# Patient Record
Sex: Female | Born: 1935 | Race: White | Hispanic: No | Marital: Single | Smoking: Never smoker
Health system: Southern US, Community
[De-identification: ages and names within clinical notes are randomized; demographics above are authoritative.]

## PROBLEM LIST (undated history)

## (undated) DIAGNOSIS — E261 Secondary hyperaldosteronism: Secondary | ICD-10-CM

## (undated) DIAGNOSIS — D6859 Other primary thrombophilia: Secondary | ICD-10-CM

## (undated) DIAGNOSIS — I10 Essential (primary) hypertension: Secondary | ICD-10-CM

## (undated) DIAGNOSIS — M1990 Unspecified osteoarthritis, unspecified site: Secondary | ICD-10-CM

## (undated) DIAGNOSIS — F419 Anxiety disorder, unspecified: Secondary | ICD-10-CM

## (undated) DIAGNOSIS — N182 Chronic kidney disease, stage 2 (mild): Secondary | ICD-10-CM

## (undated) DIAGNOSIS — E785 Hyperlipidemia, unspecified: Secondary | ICD-10-CM

## (undated) DIAGNOSIS — K635 Polyp of colon: Secondary | ICD-10-CM

## (undated) DIAGNOSIS — J189 Pneumonia, unspecified organism: Secondary | ICD-10-CM

## (undated) DIAGNOSIS — N289 Disorder of kidney and ureter, unspecified: Secondary | ICD-10-CM

## (undated) DIAGNOSIS — I739 Peripheral vascular disease, unspecified: Secondary | ICD-10-CM

## (undated) DIAGNOSIS — I700 Atherosclerosis of aorta: Secondary | ICD-10-CM

## (undated) DIAGNOSIS — I4891 Unspecified atrial fibrillation: Secondary | ICD-10-CM

## (undated) DIAGNOSIS — D497 Neoplasm of unspecified behavior of endocrine glands and other parts of nervous system: Secondary | ICD-10-CM

## (undated) DIAGNOSIS — I509 Heart failure, unspecified: Secondary | ICD-10-CM

## (undated) DIAGNOSIS — C801 Malignant (primary) neoplasm, unspecified: Secondary | ICD-10-CM

## (undated) DIAGNOSIS — E7800 Pure hypercholesterolemia, unspecified: Secondary | ICD-10-CM

## (undated) DIAGNOSIS — R296 Repeated falls: Secondary | ICD-10-CM

## (undated) DIAGNOSIS — I501 Left ventricular failure: Secondary | ICD-10-CM

## (undated) DIAGNOSIS — E559 Vitamin D deficiency, unspecified: Secondary | ICD-10-CM

## (undated) DIAGNOSIS — R739 Hyperglycemia, unspecified: Secondary | ICD-10-CM

## (undated) DIAGNOSIS — F0390 Unspecified dementia without behavioral disturbance: Secondary | ICD-10-CM

## (undated) DIAGNOSIS — K5790 Diverticulosis of intestine, part unspecified, without perforation or abscess without bleeding: Secondary | ICD-10-CM

## (undated) DIAGNOSIS — R609 Edema, unspecified: Secondary | ICD-10-CM

## (undated) HISTORY — DX: Diverticulosis of intestine, part unspecified, without perforation or abscess without bleeding: K57.90

## (undated) HISTORY — PX: CATARACT EXTRACTION: SUR2

## (undated) HISTORY — PX: APPENDECTOMY: SHX54

## (undated) HISTORY — DX: Polyp of colon: K63.5

## (undated) HISTORY — DX: Unspecified osteoarthritis, unspecified site: M19.90

## (undated) HISTORY — DX: Edema, unspecified: R60.9

## (undated) HISTORY — PX: TONSILLECTOMY: SUR1361

## (undated) HISTORY — DX: Hyperglycemia, unspecified: R73.9

## (undated) HISTORY — DX: Essential (primary) hypertension: I10

## (undated) HISTORY — DX: Hyperlipidemia, unspecified: E78.5

---

## 1995-04-11 LAB — HM COLONOSCOPY

## 1997-04-10 LAB — HM DEXA SCAN

## 2000-09-27 ENCOUNTER — Other Ambulatory Visit: Admission: RE | Admit: 2000-09-27 | Discharge: 2000-09-27 | Payer: Self-pay | Admitting: Family Medicine

## 2003-04-13 ENCOUNTER — Encounter: Admission: RE | Admit: 2003-04-13 | Discharge: 2003-04-13 | Payer: Self-pay | Admitting: Family Medicine

## 2005-10-23 ENCOUNTER — Other Ambulatory Visit: Admission: RE | Admit: 2005-10-23 | Discharge: 2005-10-23 | Payer: Self-pay | Admitting: Family Medicine

## 2006-05-22 ENCOUNTER — Ambulatory Visit (HOSPITAL_COMMUNITY): Admission: RE | Admit: 2006-05-22 | Discharge: 2006-05-22 | Payer: Self-pay | Admitting: Family Medicine

## 2007-10-29 LAB — HM PAP SMEAR

## 2007-11-06 LAB — HM MAMMOGRAPHY

## 2009-04-19 LAB — HEMOCCULT GUIAC POC 1CARD (OFFICE)

## 2010-07-12 ENCOUNTER — Encounter: Payer: Self-pay | Admitting: Family Medicine

## 2010-07-12 DIAGNOSIS — H269 Unspecified cataract: Secondary | ICD-10-CM | POA: Insufficient documentation

## 2010-07-12 DIAGNOSIS — R739 Hyperglycemia, unspecified: Secondary | ICD-10-CM

## 2010-07-12 DIAGNOSIS — M810 Age-related osteoporosis without current pathological fracture: Secondary | ICD-10-CM | POA: Insufficient documentation

## 2010-07-12 DIAGNOSIS — K635 Polyp of colon: Secondary | ICD-10-CM | POA: Insufficient documentation

## 2010-07-12 DIAGNOSIS — M1990 Unspecified osteoarthritis, unspecified site: Secondary | ICD-10-CM

## 2010-07-12 DIAGNOSIS — K5790 Diverticulosis of intestine, part unspecified, without perforation or abscess without bleeding: Secondary | ICD-10-CM | POA: Insufficient documentation

## 2010-07-12 DIAGNOSIS — E785 Hyperlipidemia, unspecified: Secondary | ICD-10-CM

## 2010-07-12 DIAGNOSIS — R609 Edema, unspecified: Secondary | ICD-10-CM

## 2010-07-12 DIAGNOSIS — I10 Essential (primary) hypertension: Secondary | ICD-10-CM | POA: Insufficient documentation

## 2010-09-20 ENCOUNTER — Emergency Department (HOSPITAL_COMMUNITY): Payer: Medicare Other

## 2010-09-20 ENCOUNTER — Emergency Department (HOSPITAL_COMMUNITY)
Admission: EM | Admit: 2010-09-20 | Discharge: 2010-09-20 | Disposition: A | Discharge status: Home / Self Care | Payer: Medicare Other | Attending: Emergency Medicine | Admitting: Emergency Medicine

## 2010-09-20 DIAGNOSIS — R112 Nausea with vomiting, unspecified: Secondary | ICD-10-CM | POA: Insufficient documentation

## 2010-09-20 DIAGNOSIS — R109 Unspecified abdominal pain: Secondary | ICD-10-CM | POA: Insufficient documentation

## 2010-09-20 DIAGNOSIS — Z79899 Other long term (current) drug therapy: Secondary | ICD-10-CM | POA: Insufficient documentation

## 2010-09-20 DIAGNOSIS — M129 Arthropathy, unspecified: Secondary | ICD-10-CM | POA: Insufficient documentation

## 2010-09-20 LAB — COMPREHENSIVE METABOLIC PANEL
ALT: 21 U/L (ref 0–35)
AST: 27 U/L (ref 0–37)
Albumin: 3.6 g/dL (ref 3.5–5.2)
Alkaline Phosphatase: 89 U/L (ref 39–117)
Calcium: 9.4 mg/dL (ref 8.4–10.5)
Chloride: 104 mEq/L (ref 96–112)
GFR calc Af Amer: >60 mL/min (ref >60)
GFR calc non Af Amer: >60 mL/min (ref >60)
Potassium: 4.2 mEq/L (ref 3.5–5.1)
Sodium: 140 mEq/L (ref 135–145)
Total Bilirubin: 0.4 mg/dL (ref 0.3–1.2)
Total Protein: 6.8 g/dL (ref 6.0–8.3)

## 2010-09-20 LAB — URINALYSIS, ROUTINE W REFLEX MICROSCOPIC
Bilirubin Urine: NEGATIVE
Glucose, UA: NEGATIVE mg/dL
Ketones, ur: NEGATIVE mg/dL
Nitrite: NEGATIVE
Specific Gravity, Urine: 1.02 (ref 1.005–1.030)
Urobilinogen, UA: 1 mg/dL (ref 0.0–1.0)

## 2010-09-20 LAB — CBC
HCT: 41.7 % (ref 36.0–46.0)
MCH: 32.3 pg (ref 26.0–34.0)
MCHC: 33.3 g/dL (ref 30.0–36.0)
MCV: 97 fL (ref 78.0–100.0)
Platelets: 253 10*3/uL (ref 150–400)
RBC: 4.3 MIL/uL (ref 3.87–5.11)
RDW: 12.9 % (ref 11.5–15.5)

## 2010-09-20 LAB — DIFFERENTIAL
Basophils Absolute: 0 10*3/uL (ref 0.0–0.1)
Basophils Relative: 0 % (ref 0–1)
Eosinophils Absolute: 0 10*3/uL (ref 0.0–0.7)
Eosinophils Relative: 0 % (ref 0–5)
Lymphocytes Relative: 12 % (ref 12–46)
Lymphs Abs: 1.2 10*3/uL (ref 0.7–4.0)
Monocytes Absolute: 0.5 10*3/uL (ref 0.1–1.0)
Monocytes Relative: 5 % (ref 3–12)
Neutro Abs: 8.9 10*3/uL — ABNORMAL HIGH (ref 1.7–7.7)
Neutrophils Relative %: 84 % — ABNORMAL HIGH (ref 43–77)

## 2010-09-20 LAB — CK TOTAL AND CKMB (NOT AT ARMC)
CK, MB: 1.8 ng/mL (ref 0.3–4.0)
Relative Index: INVALID (ref 0.0–2.5)
Total CK: 26 U/L (ref 7–177)

## 2010-09-20 LAB — LIPASE, BLOOD: Lipase: 24 U/L (ref 11–59)

## 2010-09-20 LAB — TROPONIN I: Troponin I: <0.3 ng/mL (ref <0.30)

## 2010-09-20 MED ORDER — IOHEXOL 300 MG/ML  SOLN
100.0000 mL | Freq: Once | INTRAMUSCULAR | Status: AC | PRN
  Administered 2010-09-20: 100 mL via INTRAVENOUS

## 2011-04-27 DIAGNOSIS — M1710 Unilateral primary osteoarthritis, unspecified knee: Secondary | ICD-10-CM | POA: Diagnosis not present

## 2011-05-24 DIAGNOSIS — E785 Hyperlipidemia, unspecified: Secondary | ICD-10-CM | POA: Diagnosis not present

## 2011-05-24 DIAGNOSIS — I10 Essential (primary) hypertension: Secondary | ICD-10-CM | POA: Diagnosis not present

## 2011-05-24 DIAGNOSIS — E559 Vitamin D deficiency, unspecified: Secondary | ICD-10-CM | POA: Diagnosis not present

## 2011-07-24 DIAGNOSIS — M25469 Effusion, unspecified knee: Secondary | ICD-10-CM | POA: Diagnosis not present

## 2011-07-24 DIAGNOSIS — M25569 Pain in unspecified knee: Secondary | ICD-10-CM | POA: Diagnosis not present

## 2011-08-31 DIAGNOSIS — R7309 Other abnormal glucose: Secondary | ICD-10-CM | POA: Diagnosis not present

## 2011-08-31 DIAGNOSIS — I10 Essential (primary) hypertension: Secondary | ICD-10-CM | POA: Diagnosis not present

## 2011-08-31 DIAGNOSIS — E559 Vitamin D deficiency, unspecified: Secondary | ICD-10-CM | POA: Diagnosis not present

## 2011-08-31 DIAGNOSIS — R609 Edema, unspecified: Secondary | ICD-10-CM | POA: Diagnosis not present

## 2011-08-31 DIAGNOSIS — E785 Hyperlipidemia, unspecified: Secondary | ICD-10-CM | POA: Diagnosis not present

## 2011-09-12 DIAGNOSIS — M25579 Pain in unspecified ankle and joints of unspecified foot: Secondary | ICD-10-CM | POA: Diagnosis not present

## 2011-09-12 DIAGNOSIS — M1710 Unilateral primary osteoarthritis, unspecified knee: Secondary | ICD-10-CM | POA: Diagnosis not present

## 2011-10-17 DIAGNOSIS — F411 Generalized anxiety disorder: Secondary | ICD-10-CM | POA: Diagnosis not present

## 2011-10-17 DIAGNOSIS — H6120 Impacted cerumen, unspecified ear: Secondary | ICD-10-CM | POA: Diagnosis not present

## 2011-10-17 DIAGNOSIS — R5381 Other malaise: Secondary | ICD-10-CM | POA: Diagnosis not present

## 2011-10-19 DIAGNOSIS — H40019 Open angle with borderline findings, low risk, unspecified eye: Secondary | ICD-10-CM | POA: Diagnosis not present

## 2011-10-19 DIAGNOSIS — Z961 Presence of intraocular lens: Secondary | ICD-10-CM | POA: Diagnosis not present

## 2011-10-19 DIAGNOSIS — H3530 Unspecified macular degeneration: Secondary | ICD-10-CM | POA: Diagnosis not present

## 2011-10-19 DIAGNOSIS — H0230 Blepharochalasis unspecified eye, unspecified eyelid: Secondary | ICD-10-CM | POA: Diagnosis not present

## 2011-11-23 DIAGNOSIS — K5900 Constipation, unspecified: Secondary | ICD-10-CM | POA: Diagnosis not present

## 2011-11-24 ENCOUNTER — Emergency Department (HOSPITAL_COMMUNITY)
Admission: EM | Admit: 2011-11-24 | Discharge: 2011-11-24 | Disposition: A | Discharge status: Home / Self Care | Payer: Medicare Other | Attending: Emergency Medicine | Admitting: Emergency Medicine

## 2011-11-24 ENCOUNTER — Emergency Department (HOSPITAL_COMMUNITY): Payer: Medicare Other

## 2011-11-24 ENCOUNTER — Encounter (HOSPITAL_COMMUNITY): Payer: Self-pay | Admitting: *Deleted

## 2011-11-24 DIAGNOSIS — E785 Hyperlipidemia, unspecified: Secondary | ICD-10-CM | POA: Diagnosis not present

## 2011-11-24 DIAGNOSIS — E7800 Pure hypercholesterolemia, unspecified: Secondary | ICD-10-CM | POA: Insufficient documentation

## 2011-11-24 DIAGNOSIS — Z9089 Acquired absence of other organs: Secondary | ICD-10-CM | POA: Diagnosis not present

## 2011-11-24 DIAGNOSIS — R6889 Other general symptoms and signs: Secondary | ICD-10-CM | POA: Diagnosis not present

## 2011-11-24 DIAGNOSIS — I10 Essential (primary) hypertension: Secondary | ICD-10-CM | POA: Insufficient documentation

## 2011-11-24 DIAGNOSIS — K5900 Constipation, unspecified: Secondary | ICD-10-CM | POA: Diagnosis not present

## 2011-11-24 DIAGNOSIS — M810 Age-related osteoporosis without current pathological fracture: Secondary | ICD-10-CM | POA: Insufficient documentation

## 2011-11-24 DIAGNOSIS — R1084 Generalized abdominal pain: Secondary | ICD-10-CM | POA: Diagnosis not present

## 2011-11-24 HISTORY — DX: Pure hypercholesterolemia, unspecified: E78.00

## 2011-11-24 LAB — CBC WITH DIFFERENTIAL/PLATELET
Basophils Relative: 0 % (ref 0–1)
Eosinophils Absolute: 0.1 10*3/uL (ref 0.0–0.7)
HCT: 40.1 % (ref 36.0–46.0)
Hemoglobin: 13.6 g/dL (ref 12.0–15.0)
Lymphocytes Relative: 18 % (ref 12–46)
Lymphs Abs: 1.6 10*3/uL (ref 0.7–4.0)
MCH: 32.2 pg (ref 26.0–34.0)
MCHC: 33.9 g/dL (ref 30.0–36.0)
MCV: 95 fL (ref 78.0–100.0)
Monocytes Absolute: 0.7 10*3/uL (ref 0.1–1.0)
Monocytes Relative: 8 % (ref 3–12)
Neutro Abs: 6.7 10*3/uL (ref 1.7–7.7)
Neutrophils Relative %: 73 % (ref 43–77)
Platelets: 268 10*3/uL (ref 150–400)
RBC: 4.22 MIL/uL (ref 3.87–5.11)
RDW: 13 % (ref 11.5–15.5)
WBC: 9.2 10*3/uL (ref 4.0–10.5)

## 2011-11-24 LAB — URINALYSIS, ROUTINE W REFLEX MICROSCOPIC
Bilirubin Urine: NEGATIVE
Glucose, UA: NEGATIVE mg/dL
Hgb urine dipstick: NEGATIVE
Specific Gravity, Urine: 1.01 (ref 1.005–1.030)
pH: 6.5 (ref 5.0–8.0)

## 2011-11-24 LAB — HEPATIC FUNCTION PANEL
AST: 15 U/L (ref 0–37)
Bilirubin, Direct: 0.1 mg/dL (ref 0.0–0.3)
Indirect Bilirubin: 0.3 mg/dL (ref 0.3–0.9)
Total Bilirubin: 0.4 mg/dL (ref 0.3–1.2)

## 2011-11-24 LAB — BASIC METABOLIC PANEL
BUN: 18 mg/dL (ref 6–23)
CO2: 29 mEq/L (ref 19–32)
Chloride: 100 mEq/L (ref 96–112)
Creatinine, Ser: 0.71 mg/dL (ref 0.50–1.10)
GFR calc Af Amer: 83 mL/min — ABNORMAL LOW (ref >90)
GFR calc non Af Amer: 72 mL/min — ABNORMAL LOW (ref >90)
Glucose, Bld: 102 mg/dL — ABNORMAL HIGH (ref 70–99)
Potassium: 4 mEq/L (ref 3.5–5.1)
Sodium: 136 mEq/L (ref 135–145)

## 2011-11-24 LAB — URINE MICROSCOPIC-ADD ON

## 2011-11-24 MED ORDER — POLYETHYLENE GLYCOL 3350 17 GM/SCOOP PO POWD: ORAL | Status: DC

## 2011-11-24 NOTE — ED Notes (Signed)
Pt alert & oriented x4, stable gait. Patient  given discharge instructions, paperwork & prescription(s). Patient verbalized understanding. Pt left department w/ no further questions. 

## 2011-11-24 NOTE — ED Provider Notes (Signed)
History  This chart was scribed for Carleene Cooper III, MD by Erskine Emery. This patient was seen in room [facility]04/[facility]04 and the patient's care was started at 17:54.   CSN: [id number]  Arrival date & time 11/24/11  1633   First MD Initiated Contact with Patient 11/24/11 1754      Chief Complaint  Patient presents with  . Abdominal Pain    (Consider location/radiation/quality/duration/timing/severity/associated sxs/prior treatment) HPI Marissa Burns is a 78 y.o. female brought in by ambulance, who presents to the Emergency Department complaining of diffuse abdominal pain for several days with associated nausea, diaphoresis, and constipation. Pt denies any dysuria or fever. Pt reports her last bowel movement was 2 days ago after using a suppository and it was small. Pt tried magnesium today and began taking an OTC laxative a week ago. Pt has had a cholecystectomy and tonsilectomy and has a h/o diverticulosis, osteoarthritis, osteoporosis, HTN and high cholesterol. Pt takes medication for the HTN and high cholesterol. Pt lives alone and reports for a 78 year old, her health is usually good.  Pt has NKDA and doesn't smoke or drink.   Past Medical History  Diagnosis Date  . Essential hypertension, benign   . Colon polyp   . Other and unspecified hyperlipidemia   . Diverticulosis   . Osteoarthritis   . Edema   . Osteoporosis   . Elevated blood sugar   . Cataract   . High cholesterol     Past Surgical History  Procedure Date  . Cataract extraction   . Appendectomy   . Tonsillectomy     No family history on file.  History  Substance Use Topics  . Smoking status: Not on file  . Smokeless tobacco: Not on file  . Alcohol Use:     OB History    Grav Para Term Preterm Abortions TAB SAB Ect Mult Living                  Review of Systems  Constitutional: Positive for diaphoresis. Negative for fever and chills.  Respiratory: Negative for shortness of breath.     Gastrointestinal: Positive for nausea, abdominal pain and constipation. Negative for vomiting.  Genitourinary: Negative for dysuria.  Musculoskeletal: Positive for joint swelling.  Neurological: Negative for syncope.    Allergies  Alendronate sodium; Celebrex; Dilacor; Evista; and Propulsid  Home Medications   Current Outpatient Rx  Name Route Sig Dispense Refill  . ACETAMINOPHEN 500 MG PO TABS Oral Take 500 mg by mouth daily after lunch.    . AMLODIPINE BESY-BENAZEPRIL HCL 5-20 MG PO CAPS Oral Take 1 capsule by mouth daily after breakfast.     . ASPIRIN 81 MG PO TBEC Oral Take 81 mg by mouth daily after breakfast.     . ATORVASTATIN CALCIUM 80 MG PO TABS Oral Take 80 mg by mouth daily after breakfast.    . CALCIUM CARBONATE ANTACID 500 MG PO CHEW Oral Chew 1 tablet by mouth daily.      [name]     . FUROSEMIDE 20 MG PO TABS Oral Take 20 mg by mouth daily after breakfast.       Triage Vitals: BP 160/77  Pulse 89  Temp 98 F (36.7 C) (Oral)  Resp 20  Ht 5\' 2"  (1.575 m)  Wt 145 lb (65.772 kg)  BMI 26.52 kg/m2  SpO2 94%  Physical Exam  Nursing note and  vitals reviewed. Constitutional: She is oriented to person, place, and time. She appears well-developed and well-nourished. No distress.  HENT:  Head: Normocephalic and atraumatic.  Mouth/Throat: Oropharynx is clear and moist.  Eyes: EOM are normal. Pupils are equal, round, and reactive to light.  Neck: Neck supple. No tracheal deviation present.  Cardiovascular: Normal rate, regular rhythm and normal heart sounds.   Pulmonary/Chest: Effort normal and breath sounds normal. No respiratory distress. She has no wheezes.  Abdominal: Soft. She exhibits no distension and no mass. There is no tenderness.       Increased bowel sounds.  Genitourinary:       Rectal exam shows soft brown stool, no fecal impaction.  Musculoskeletal: Normal range of motion. She  exhibits no edema.       Joint enlargement from arthritis in knees bilaterally.  Lymphadenopathy:    She has no cervical adenopathy.  Neurological: She is alert and oriented to person, place, and time.  Skin: Skin is warm and dry.  Psychiatric: She has a normal mood and affect.    ED Course  Procedures (including critical care time) DIAGNOSTIC STUDIES: Oxygen Saturation is 94% on room air, adequate by my interpretation.    COORDINATION OF CARE: 18:32--I evaluated the patient and we discussed a treatment plan including urinalysis, blood work, and imaging to which the pt agreed.     Labs Reviewed  CBC WITH DIFFERENTIAL  BASIC METABOLIC PANEL  URINALYSIS, ROUTINE W REFLEX MICROSCOPIC   Dg Abd Acute W/chest  11/24/2011  *RADIOLOGY REPORT*  Clinical Data: Abdominal cramping, nausea, constipation.  ACUTE ABDOMEN SERIES (ABDOMEN 2 VIEW & CHEST 1 VIEW)  Comparison: 09/20/2010  Findings: Large stool burden throughout the colon.  No evidence of bowel obstruction or free air.  Prior cholecystectomy.  No organomegaly or suspicious calcification.  Small calcifications in the pelvis felt to represent phleboliths as seen on prior CT.  The lungs are clear.  No effusions.  Heart is normal size.  No acute bony abnormality.  IMPRESSION: Moderate to large stool burden throughout the colon.  No evidence of obstruction or free air.  Prior cholecystectomy.  Original Report Authenticated By: Cyndie Chime, M.D.     1. Constipation     DISP:  Rx Miralax one scoop in glass of water bid, every day, to treat her constipation.   I personally performed the services described in this documentation, which was scribed in my presence. The recorded information has been reviewed and considered.  Osvaldo Human, MD     Carleene Cooper III, MD 11/24/11 Rosamaria Lints

## 2011-11-24 NOTE — ED Notes (Signed)
C/o diffuse abd pain x 2 weeks; pt states she is constipated; states last BM 2 days ago after using suppository with small results.  Denies vomiting, but c/o nausea.

## 2011-11-26 LAB — URINE CULTURE

## 2011-12-04 DIAGNOSIS — R109 Unspecified abdominal pain: Secondary | ICD-10-CM | POA: Diagnosis not present

## 2011-12-04 DIAGNOSIS — E559 Vitamin D deficiency, unspecified: Secondary | ICD-10-CM | POA: Diagnosis not present

## 2011-12-04 DIAGNOSIS — E785 Hyperlipidemia, unspecified: Secondary | ICD-10-CM | POA: Diagnosis not present

## 2011-12-04 DIAGNOSIS — E039 Hypothyroidism, unspecified: Secondary | ICD-10-CM | POA: Diagnosis not present

## 2011-12-04 DIAGNOSIS — I10 Essential (primary) hypertension: Secondary | ICD-10-CM | POA: Diagnosis not present

## 2011-12-07 DIAGNOSIS — Z1212 Encounter for screening for malignant neoplasm of rectum: Secondary | ICD-10-CM | POA: Diagnosis not present

## 2011-12-19 DIAGNOSIS — K5900 Constipation, unspecified: Secondary | ICD-10-CM | POA: Diagnosis not present

## 2011-12-19 DIAGNOSIS — R634 Abnormal weight loss: Secondary | ICD-10-CM | POA: Diagnosis not present

## 2011-12-19 DIAGNOSIS — R609 Edema, unspecified: Secondary | ICD-10-CM | POA: Diagnosis not present

## 2012-01-18 DIAGNOSIS — Z23 Encounter for immunization: Secondary | ICD-10-CM | POA: Diagnosis not present

## 2012-03-04 DIAGNOSIS — I10 Essential (primary) hypertension: Secondary | ICD-10-CM | POA: Diagnosis not present

## 2012-03-04 DIAGNOSIS — E785 Hyperlipidemia, unspecified: Secondary | ICD-10-CM | POA: Diagnosis not present

## 2012-03-04 DIAGNOSIS — R5383 Other fatigue: Secondary | ICD-10-CM | POA: Diagnosis not present

## 2012-03-04 DIAGNOSIS — E559 Vitamin D deficiency, unspecified: Secondary | ICD-10-CM | POA: Diagnosis not present

## 2012-03-28 DIAGNOSIS — M1710 Unilateral primary osteoarthritis, unspecified knee: Secondary | ICD-10-CM | POA: Diagnosis not present

## 2012-06-19 DIAGNOSIS — E559 Vitamin D deficiency, unspecified: Secondary | ICD-10-CM | POA: Diagnosis not present

## 2012-06-19 DIAGNOSIS — I10 Essential (primary) hypertension: Secondary | ICD-10-CM | POA: Diagnosis not present

## 2012-06-19 DIAGNOSIS — E785 Hyperlipidemia, unspecified: Secondary | ICD-10-CM | POA: Diagnosis not present

## 2012-07-03 ENCOUNTER — Encounter: Payer: Self-pay | Admitting: Family Medicine

## 2012-07-09 ENCOUNTER — Telehealth: Payer: Self-pay

## 2012-07-10 ENCOUNTER — Telehealth: Payer: Self-pay | Admitting: Family Medicine

## 2012-07-10 ENCOUNTER — Telehealth: Payer: Self-pay

## 2012-07-10 ENCOUNTER — Encounter: Payer: Self-pay | Admitting: Family Medicine

## 2012-07-10 DIAGNOSIS — M25569 Pain in unspecified knee: Secondary | ICD-10-CM

## 2012-07-10 NOTE — Telephone Encounter (Signed)
Please do referral to ortho next week for knee injection

## 2012-07-10 NOTE — Telephone Encounter (Signed)
Ortho referral  Dr Charlann Boxer next week

## 2012-07-10 NOTE — Telephone Encounter (Signed)
Refer to ortho for knee pain. 

## 2012-07-10 NOTE — Telephone Encounter (Signed)
Dr Latricia Heft next week per [facility]

## 2012-07-10 NOTE — Telephone Encounter (Signed)
Dr Charlann Boxer

## 2012-07-16 ENCOUNTER — Other Ambulatory Visit: Payer: Self-pay | Admitting: Family Medicine

## 2012-08-01 DIAGNOSIS — M1710 Unilateral primary osteoarthritis, unspecified knee: Secondary | ICD-10-CM | POA: Diagnosis not present

## 2012-08-22 NOTE — Telephone Encounter (Signed)
error 

## 2012-09-05 DIAGNOSIS — M1710 Unilateral primary osteoarthritis, unspecified knee: Secondary | ICD-10-CM | POA: Diagnosis not present

## 2012-09-16 DIAGNOSIS — M1710 Unilateral primary osteoarthritis, unspecified knee: Secondary | ICD-10-CM | POA: Diagnosis not present

## 2012-09-30 DIAGNOSIS — M1710 Unilateral primary osteoarthritis, unspecified knee: Secondary | ICD-10-CM | POA: Diagnosis not present

## 2012-10-22 DIAGNOSIS — H353190 Nonexudative age-related macular degeneration, unspecified eye, stage unspecified: Secondary | ICD-10-CM | POA: Diagnosis not present

## 2012-10-30 ENCOUNTER — Encounter: Payer: Self-pay | Admitting: Family Medicine

## 2012-10-30 ENCOUNTER — Ambulatory Visit (INDEPENDENT_AMBULATORY_CARE_PROVIDER_SITE_OTHER): Payer: Medicare Other | Admitting: Family Medicine

## 2012-10-30 VITALS — BP 143/67 | HR 73 | Temp 98.0°F | Ht 60.5 in | Wt 151.0 lb

## 2012-10-30 DIAGNOSIS — E559 Vitamin D deficiency, unspecified: Secondary | ICD-10-CM | POA: Diagnosis not present

## 2012-10-30 DIAGNOSIS — I10 Essential (primary) hypertension: Secondary | ICD-10-CM | POA: Diagnosis not present

## 2012-10-30 DIAGNOSIS — M1990 Unspecified osteoarthritis, unspecified site: Secondary | ICD-10-CM | POA: Insufficient documentation

## 2012-10-30 DIAGNOSIS — R5381 Other malaise: Secondary | ICD-10-CM

## 2012-10-30 DIAGNOSIS — M129 Arthropathy, unspecified: Secondary | ICD-10-CM | POA: Diagnosis not present

## 2012-10-30 DIAGNOSIS — R5383 Other fatigue: Secondary | ICD-10-CM

## 2012-10-30 DIAGNOSIS — E785 Hyperlipidemia, unspecified: Secondary | ICD-10-CM

## 2012-10-30 LAB — POCT CBC
Hemoglobin: 13.8 g/dL (ref 12.2–16.2)
Lymph, poc: 1.6 (ref 0.6–3.4)
MCH, POC: 31.8 pg — AB (ref 27–31.2)
MCHC: 34.4 g/dL (ref 31.8–35.4)
MPV: 7.9 fL (ref 0–99.8)
POC Granulocyte: 5.6 (ref 2–6.9)
POC LYMPH PERCENT: 21.1 %L (ref 10–50)
Platelet Count, POC: 242 10*3/uL (ref 142–424)

## 2012-10-30 MED ORDER — BUSPIRONE HCL 5 MG PO TABS: 5.0000 mg | ORAL_TABLET | Freq: Two times a day (BID) | ORAL | Status: DC

## 2012-10-30 NOTE — Patient Instructions (Signed)
Fall precautions discussed Continue current meds and therapeutic lifestyle changes 

## 2012-10-30 NOTE — Progress Notes (Signed)
  Subjective:    Patient ID: Marissa Burns, female    DOB: 31-Jul-1914, 79 y.o.   MRN: [id number]  HPI The patient returns to clinic today for followup and management of chronic medical problems. These include hypertension, hyperlipidemia, vitamin D deficiency and osteoarthritis. She did see the orthopedist on a couple occasions for injection of knees and apparently this did not help a lot.   Review of Systems  Constitutional: Positive for fatigue.  HENT: Negative.   Eyes: Negative for visual disturbance (eye exam last week, new glasses).  Respiratory: Negative for cough and shortness of breath.   Cardiovascular: Positive for leg swelling (L>R). Negative for chest pain.  Gastrointestinal: Positive for constipation. Negative for nausea and abdominal distention.  Endocrine: Positive for cold intolerance.  Genitourinary: Negative for dysuria and frequency.  Musculoskeletal: Positive for arthralgias (bilateral knees). Negative for back pain.  Skin: Negative.   Allergic/Immunologic: Negative for environmental allergies.  Neurological: Positive for dizziness (occasional, positional). Negative for headaches.  Hematological: Bruises/bleeds easily.  Psychiatric/Behavioral: The patient is nervous/anxious (some at night).        Objective:   Physical Exam BP 143/67  Pulse 73  Temp(Src) 98 F (36.7 C) (Oral)  Ht 5' 0.5" (1.537 m)  Wt 151 lb (68.493 kg)  BMI 28.99 kg/m2  The patient appeared well nourished and normally developed, alert and oriented to time and place. Speech, behavior and judgement appear normal. Vital signs as documented.  Head exam is unremarkable. No scleral icterus or pallor noted. She has a hearing aid in the left ear and is definitely hearing better than the last time I saw her.  Neck is without jugular venous distension, thyromegally, or carotid bruits. Carotid upstrokes are brisk bilaterally. No cervical adenopathy. Lungs are clear anteriorly and posteriorly to  auscultation. Normal respiratory effort. Cardiac exam reveals regular rate and rhythm at 72 per minute. First and second heart sounds normal.  No murmurs, rubs or gallops.  Abdominal exam reveals obesity, normal bowl sounds, no masses, no organomegaly and no aortic enlargement. No inguinal adenopathy. No abdominal tenderness Extremities are nonedematous and both pedal pulses are normal. She has definite tenderness at the joint lines of both knees left greater than right. She also has stiffness associated with movement. Skin without pallor or jaundice.  Warm and dry, without rash. Neurologic exam reveals normal deep tendon reflexes and normal sensation.          Assessment & Plan:  1. Hypertension - BASIC METABOLIC PANEL WITH GFR; Standing - BASIC METABOLIC PANEL WITH GFR  2. Hyperlipemia - Hepatic function panel; Standing - NMR Lipoprofile with Lipids; Standing - Hepatic function panel - NMR Lipoprofile with Lipids  3. Vitamin D deficiency - Vitamin D 25 hydroxy; Standing - Vitamin D 25 hydroxy  4. Arthritis - POCT CBC; Standing - POCT CBC  5. Fatigue - POCT CBC; Standing - Thyroid Panel With TSH - POCT CBC  Patient Instructions  Fall precautions discussed Continue current meds and therapeutic lifestyle changes   Nyra Capes MD

## 2012-10-31 LAB — NMR LIPOPROFILE WITH LIPIDS
HDL Size: 9.1 nm — ABNORMAL LOW (ref >=9.2)
HDL-C: 43 mg/dL (ref >=40)
LDL (calc): 53 mg/dL (ref <100)
LDL Particle Number: 876 nmol/L (ref <1000)
LDL Size: 20.2 nm — ABNORMAL LOW (ref >20.5)
LP-IR Score: 39 (ref <=45)
Large HDL-P: 7.7 umol/L (ref >=4.8)
Triglycerides: 170 mg/dL — ABNORMAL HIGH (ref <150)
VLDL Size: 43.1 nm (ref <=46.6)

## 2012-10-31 LAB — HEPATIC FUNCTION PANEL
Albumin: 4 g/dL (ref 3.5–5.2)
Total Bilirubin: 0.6 mg/dL (ref 0.3–1.2)
Total Protein: 6.2 g/dL (ref 6.0–8.3)

## 2012-10-31 LAB — THYROID PANEL WITH TSH
Free Thyroxine Index: 5.1 — ABNORMAL HIGH (ref 1.0–3.9)
TSH: 3.109 u[IU]/mL (ref 0.350–4.500)

## 2012-10-31 LAB — BASIC METABOLIC PANEL WITH GFR
Chloride: 103 mEq/L (ref 96–112)
GFR, Est African American: 87 mL/min
GFR, Est Non African American: 75 mL/min
Potassium: 4.7 mEq/L (ref 3.5–5.3)
Sodium: 141 mEq/L (ref 135–145)

## 2012-10-31 LAB — VITAMIN D 25 HYDROXY (VIT D DEFICIENCY, FRACTURES): Vit D, 25-Hydroxy: 59 ng/mL (ref 30–89)

## 2013-02-05 ENCOUNTER — Encounter: Payer: Self-pay | Admitting: Family Medicine

## 2013-02-05 ENCOUNTER — Encounter (INDEPENDENT_AMBULATORY_CARE_PROVIDER_SITE_OTHER): Payer: Self-pay

## 2013-02-05 ENCOUNTER — Ambulatory Visit (INDEPENDENT_AMBULATORY_CARE_PROVIDER_SITE_OTHER): Payer: Medicare Other | Admitting: Family Medicine

## 2013-02-05 VITALS — BP 161/78 | HR 78 | Temp 97.9°F | Ht 60.5 in | Wt 149.0 lb

## 2013-02-05 DIAGNOSIS — E559 Vitamin D deficiency, unspecified: Secondary | ICD-10-CM

## 2013-02-05 DIAGNOSIS — M129 Arthropathy, unspecified: Secondary | ICD-10-CM

## 2013-02-05 DIAGNOSIS — R5381 Other malaise: Secondary | ICD-10-CM | POA: Diagnosis not present

## 2013-02-05 DIAGNOSIS — E785 Hyperlipidemia, unspecified: Secondary | ICD-10-CM | POA: Diagnosis not present

## 2013-02-05 DIAGNOSIS — I10 Essential (primary) hypertension: Secondary | ICD-10-CM | POA: Diagnosis not present

## 2013-02-05 DIAGNOSIS — Z23 Encounter for immunization: Secondary | ICD-10-CM

## 2013-02-05 DIAGNOSIS — M1990 Unspecified osteoarthritis, unspecified site: Secondary | ICD-10-CM

## 2013-02-05 DIAGNOSIS — R5383 Other fatigue: Secondary | ICD-10-CM

## 2013-02-05 LAB — POCT CBC
Granulocyte percent: 70 %G (ref 37–80)
HCT, POC: 40.9 % (ref 37.7–47.9)
MCH, POC: 31.1 pg (ref 27–31.2)
MCV: 93.9 fL (ref 80–97)
RDW, POC: 12.9 %
WBC: 7 10*3/uL (ref 4.6–10.2)

## 2013-02-05 NOTE — Addendum Note (Signed)
Addended by: Magdalene River on: 02/05/2013 12:31 PM   Modules accepted: Orders

## 2013-02-05 NOTE — Patient Instructions (Addendum)
Continue current medications. Continue good therapeutic lifestyle changes.  Fall precautions discussed with patient. Follow up as planned and earlier as needed.  We will call you with the results of the lab work once this is available

## 2013-02-05 NOTE — Progress Notes (Signed)
Subjective:    Patient ID: Marissa Burns, female    DOB: 1915/04/03, 79 y.o.   MRN: [id number]  HPI Pt here for follow up and management of chronic medical problems. Patient continues to have complaints with her arthritis especially in her knees. She also is hard of hearing.      Patient Active Problem List   Diagnosis Date Noted  . Hypertension 10/30/2012  . Hyperlipemia 10/30/2012  . Vitamin D deficiency 10/30/2012  . Arthritis 10/30/2012  . Essential hypertension, benign   . Colon polyp   . Other and unspecified hyperlipidemia   . Diverticulosis   . Osteoarthritis   . Edema   . Osteoporosis   . Elevated blood sugar   . Cataract    Outpatient Encounter Prescriptions as of 02/05/2013  Medication Sig Dispense Refill  . acetaminophen (TYLENOL) 500 MG tablet Take 500 mg by mouth daily after lunch.      Marland Kitchen amLODipine-benazepril (LOTREL) 5-20 MG per capsule Take 1 capsule by mouth daily after breakfast.       . aspirin 81 MG EC tablet Take 81 mg by mouth daily after breakfast.       . atorvastatin (LIPITOR) 80 MG tablet TAKE ONE TABLET BY MOUTH ONE TIME DAILY  30 tablet  5  . busPIRone (BUSPAR) 5 MG tablet Take 1 tablet (5 mg total) by mouth 2 (two) times daily.  30 tablet  5  . calcium carbonate (TUMS - DOSED IN MG ELEMENTAL CALCIUM) 500 MG chewable tablet Chew 1 tablet by mouth daily.        . Cholecalciferol (VITAMIN D3) 2000 UNITS TABS Take 1 tablet by mouth daily after breakfast.       . furosemide (LASIX) 20 MG tablet Take 20 mg by mouth daily after breakfast.       . polyethylene glycol powder (GLYCOLAX/MIRALAX) powder Put a scoop of Miralax in a glass of water and drink it, twice a day, every day, to treat constipation.  255 g  0   No facility-administered encounter medications on file as of 02/05/2013.    Review of Systems  Constitutional: Negative.   HENT: Negative.   Eyes: Negative.   Respiratory: Negative.   Cardiovascular: Negative.   Gastrointestinal:  Negative.   Endocrine: Negative.   Genitourinary: Negative.   Musculoskeletal: Positive for arthralgias (bilateral knee pain).  Skin: Negative.   Allergic/Immunologic: Negative.   Neurological: Negative.   Hematological: Negative.   Psychiatric/Behavioral: Negative.        Objective:   Physical Exam  Nursing note and vitals reviewed. Constitutional: She is oriented to person, place, and time. She appears well-developed and well-nourished.  For her age  HENT:  Head: Normocephalic.  Right Ear: External ear normal.  Left Ear: External ear normal.  Nose: Nose normal.  Mouth/Throat: Oropharynx is clear and moist.  Diminished hearing  Eyes: Conjunctivae and EOM are normal. Pupils are equal, round, and reactive to light. Right eye exhibits no discharge. Left eye exhibits no discharge. No scleral icterus.  Neck: Normal range of motion. Neck supple. No JVD present. No thyromegaly present.  Cardiovascular: Normal rate and normal heart sounds.  Exam reveals no gallop and no friction rub.   No murmur heard. Irregular at 84 per minute  Pulmonary/Chest: Effort normal and breath sounds normal. No respiratory distress. She has no wheezes. She has no rales. She exhibits no tenderness.  Abdominal: Soft. Bowel sounds are normal. She exhibits no mass. There is no tenderness. There is no  rebound and no guarding.  Musculoskeletal: Normal range of motion. She exhibits no edema and no tenderness.  Limited range of motion secondary to bilateral knee joint arthritis  Lymphadenopathy:    She has no cervical adenopathy.  Neurological: She is alert and oriented to person, place, and time. No cranial nerve deficit.  Skin: Skin is warm and dry.  Psychiatric: She has a normal mood and affect. Her behavior is normal. Judgment and thought content normal.   BP 161/78  Pulse 78  Temp(Src) 97.9 F (36.6 C) (Oral)  Ht 5' 0.5" (1.537 m)  Wt 149 lb (67.586 kg)  BMI 28.61 kg/m2        Assessment & Plan:    1. Arthritis   2. Fatigue   3. Hyperlipemia   4. Vitamin D deficiency   5. Hypertension    No orders of the defined types were placed in this encounter.   Lab work was done this morning. Patient will receive Prevnar and flu shot today  Patient Instructions  Continue current medications. Continue good therapeutic lifestyle changes.  Fall precautions discussed with patient. Follow up as planned and earlier as needed.  We will call you with the results of the lab work once this is available    Nyra Capes MD

## 2013-02-06 LAB — NMR LIPOPROFILE WITH LIPIDS
Cholesterol, Total: 132 mg/dL (ref <200)
HDL Particle Number: 34.6 umol/L (ref >=30.5)
LDL Particle Number: 976 nmol/L (ref <1000)
Large VLDL-P: 1.6 nmol/L (ref <=2.7)
Small LDL Particle Number: 497 nmol/L (ref <=527)

## 2013-02-06 LAB — HEPATIC FUNCTION PANEL
ALT: 10 U/L (ref 0–35)
AST: 14 U/L (ref 0–37)
Bilirubin, Direct: 0.1 mg/dL (ref 0.0–0.3)
Total Protein: 5.9 g/dL — ABNORMAL LOW (ref 6.0–8.3)

## 2013-02-06 LAB — BASIC METABOLIC PANEL WITH GFR
CO2: 27 mEq/L (ref 19–32)
Calcium: 9 mg/dL (ref 8.4–10.5)
Creat: 0.61 mg/dL (ref 0.50–1.10)
Glucose, Bld: 116 mg/dL — ABNORMAL HIGH (ref 70–99)

## 2013-02-06 LAB — VITAMIN D 25 HYDROXY (VIT D DEFICIENCY, FRACTURES): Vit D, 25-Hydroxy: 54 ng/mL (ref 30–89)

## 2013-02-11 ENCOUNTER — Other Ambulatory Visit: Payer: Self-pay

## 2013-02-11 MED ORDER — LIDOCAINE 5 % EX PTCH: 1.0000 | MEDICATED_PATCH | CUTANEOUS | Status: DC

## 2013-02-11 NOTE — Telephone Encounter (Signed)
This is okay to refill as needed

## 2013-02-11 NOTE — Telephone Encounter (Signed)
Last seen 02/05/13  DWM  Given Lidoderm patches  [facility] they work and now wants RX  If approved print and route to nurse

## 2013-02-13 NOTE — Telephone Encounter (Signed)
Called in.

## 2013-02-14 ENCOUNTER — Other Ambulatory Visit (INDEPENDENT_AMBULATORY_CARE_PROVIDER_SITE_OTHER): Payer: Self-pay

## 2013-02-14 DIAGNOSIS — Z1212 Encounter for screening for malignant neoplasm of rectum: Secondary | ICD-10-CM | POA: Diagnosis not present

## 2013-02-14 NOTE — Progress Notes (Signed)
Patient dropped off fobt 

## 2013-02-18 ENCOUNTER — Encounter: Payer: Self-pay | Admitting: *Deleted

## 2013-02-18 NOTE — Progress Notes (Signed)
Quick Note:  Copy of labs sent to patient ______ 

## 2013-04-21 ENCOUNTER — Ambulatory Visit: Payer: Medicare Other | Admitting: Family Medicine

## 2013-05-01 ENCOUNTER — Other Ambulatory Visit: Payer: Self-pay | Admitting: *Deleted

## 2013-05-01 MED ORDER — BENAZEPRIL HCL 20 MG PO TABS: 20.0000 mg | ORAL_TABLET | Freq: Every day | ORAL | Status: DC

## 2013-05-01 MED ORDER — AMLODIPINE BESYLATE 5 MG PO TABS: 5.0000 mg | ORAL_TABLET | Freq: Every day | ORAL | Status: DC

## 2013-05-20 ENCOUNTER — Ambulatory Visit (INDEPENDENT_AMBULATORY_CARE_PROVIDER_SITE_OTHER): Payer: Medicare Other | Admitting: Family Medicine

## 2013-05-20 ENCOUNTER — Encounter: Payer: Self-pay | Admitting: Family Medicine

## 2013-05-20 VITALS — BP 139/70 | HR 77 | Temp 96.4°F | Ht 60.5 in | Wt 147.0 lb

## 2013-05-20 DIAGNOSIS — I10 Essential (primary) hypertension: Secondary | ICD-10-CM

## 2013-05-20 DIAGNOSIS — H9190 Unspecified hearing loss, unspecified ear: Secondary | ICD-10-CM

## 2013-05-20 DIAGNOSIS — R739 Hyperglycemia, unspecified: Secondary | ICD-10-CM

## 2013-05-20 DIAGNOSIS — E785 Hyperlipidemia, unspecified: Secondary | ICD-10-CM | POA: Diagnosis not present

## 2013-05-20 DIAGNOSIS — K5900 Constipation, unspecified: Secondary | ICD-10-CM

## 2013-05-20 DIAGNOSIS — E559 Vitamin D deficiency, unspecified: Secondary | ICD-10-CM

## 2013-05-20 DIAGNOSIS — R7309 Other abnormal glucose: Secondary | ICD-10-CM

## 2013-05-20 DIAGNOSIS — M1990 Unspecified osteoarthritis, unspecified site: Secondary | ICD-10-CM

## 2013-05-20 DIAGNOSIS — K5909 Other constipation: Secondary | ICD-10-CM

## 2013-05-20 DIAGNOSIS — E8881 Metabolic syndrome: Secondary | ICD-10-CM | POA: Diagnosis not present

## 2013-05-20 LAB — POCT CBC
Granulocyte percent: 73.1 %G (ref 37–80)
HEMATOCRIT: 41.8 % (ref 37.7–47.9)
HEMOGLOBIN: 13 g/dL (ref 12.2–16.2)
Lymph, poc: 1.8 (ref 0.6–3.4)
MCH: 29.6 pg (ref 27–31.2)
MCHC: 31.1 g/dL — AB (ref 31.8–35.4)
MCV: 95.3 fL (ref 80–97)
MPV: 8.1 fL (ref 0–99.8)
POC Granulocyte: 5.5 (ref 2–6.9)
POC LYMPH PERCENT: 24.1 %L (ref 10–50)
Platelet Count, POC: 246 10*3/uL (ref 142–424)
RBC: 4.4 M/uL (ref 4.04–5.48)
RDW, POC: 13.3 %
WBC: 7.5 10*3/uL (ref 4.6–10.2)

## 2013-05-20 LAB — POCT GLYCOSYLATED HEMOGLOBIN (HGB A1C): Hemoglobin A1C: 5.8

## 2013-05-20 MED ORDER — LINACLOTIDE 145 MCG PO CAPS: 145.0000 ug | ORAL_CAPSULE | Freq: Every day | ORAL | Status: DC

## 2013-05-20 NOTE — Progress Notes (Signed)
Subjective:    Patient ID: Marissa Burns, female    DOB: [birth year], 80 y.o.   MRN: [id number]  HPI Pt here for follow up and management of chronic medical problems. The patient continues to have problems with her osteoarthritis especially in her knees. She also continues to have chronic constipation and uses MiraLax for this. She does note occasional bruising.      Patient Active Problem List   Diagnosis Date Noted  . Hypertension 10/30/2012  . Hyperlipemia 10/30/2012  . Vitamin D deficiency 10/30/2012  . Arthritis 10/30/2012  . Colon polyp   . Diverticulosis   . Osteoarthritis   . Edema   . Osteoporosis   . Elevated blood sugar   . Cataract    Outpatient Encounter Prescriptions as of 05/20/2013  Medication Sig  . acetaminophen (TYLENOL) 500 MG tablet Take 500 mg by mouth daily after lunch.  Marland Kitchen amLODipine (NORVASC) 5 MG tablet Take 1 tablet (5 mg total) by mouth daily.  Marland Kitchen aspirin 81 MG EC tablet Take 81 mg by mouth daily after breakfast.   . atorvastatin (LIPITOR) 80 MG tablet TAKE ONE TABLET BY MOUTH ONE TIME DAILY  . benazepril (LOTENSIN) 20 MG tablet Take 1 tablet (20 mg total) by mouth daily.  . busPIRone (BUSPAR) 5 MG tablet Take 1 tablet (5 mg total) by mouth 2 (two) times daily.  . calcium carbonate (TUMS - DOSED IN MG ELEMENTAL CALCIUM) 500 MG chewable tablet Chew 1 tablet by mouth daily.    . Cholecalciferol (VITAMIN D3) 2000 UNITS TABS Take 1 tablet by mouth daily after breakfast.   . furosemide (LASIX) 20 MG tablet Take 20 mg by mouth daily after breakfast.   . lidocaine (LIDODERM) 5 % Place 1 patch onto the skin daily. Remove & Discard patch within 12 hours or as directed by MD  . polyethylene glycol powder (GLYCOLAX/MIRALAX) powder Put a scoop of Miralax in a glass of water and drink it, twice a day, every day, to treat constipation.  . [DISCONTINUED] amLODipine-benazepril (LOTREL) 5-20 MG per capsule Take 1 capsule by mouth daily after breakfast.     Review  of Systems  Constitutional: Negative.   HENT: Negative.   Eyes: Negative.   Respiratory: Negative.   Cardiovascular: Negative.   Gastrointestinal: Positive for constipation (uses miralax).  Endocrine: Negative.   Genitourinary: Negative.   Musculoskeletal: Positive for arthralgias (bilateral knee pain).  Skin: Negative.   Allergic/Immunologic: Negative.   Neurological: Negative.   Hematological: Negative.   Psychiatric/Behavioral: Negative.        Objective:   Physical Exam  Nursing note and vitals reviewed. Constitutional: She is oriented to person, place, and time. She appears well-developed and well-nourished. No distress.  Patient is doing extremely well and looks extremely well for her age of 85  HENT:  Head: Normocephalic and atraumatic.  Right Ear: External ear normal.  Left Ear: External ear normal.  Nose: Nose normal.  Mouth/Throat: Oropharynx is clear and moist.   Hearing aid is in place in left ear and there is some cerumen bilaterally  Eyes: Conjunctivae and EOM are normal. Pupils are equal, round, and reactive to light. Right eye exhibits no discharge. Left eye exhibits no discharge. No scleral icterus.  Neck: Normal range of motion. Neck supple. No thyromegaly present.  No carotid bruits  Cardiovascular: Normal rate, regular rhythm, normal heart sounds and intact distal pulses.  Exam reveals no gallop and no friction rub.   No murmur heard. At 72 per  minute  Pulmonary/Chest: Effort normal and breath sounds normal. No respiratory distress. She has no wheezes. She has no rales. She exhibits no tenderness.  Abdominal: Soft. Bowel sounds are normal. She exhibits no mass. There is tenderness (slight epigastric tenderness). There is no rebound and no guarding.  Musculoskeletal: She exhibits tenderness. She exhibits no edema.  Limited range of motion because of bilateral knee pain and osteoarthritis. She has tender joints in both knees.  Lymphadenopathy:    She has no  cervical adenopathy.  Neurological: She is alert and oriented to person, place, and time. She has normal reflexes. No cranial nerve deficit.  Skin: Skin is warm and dry. No rash noted.  Bruises noted on right wrist  Psychiatric: She has a normal mood and affect. Her behavior is normal. Judgment and thought content normal.   BP 139/70  Pulse 77  Temp(Src) 96.4 F (35.8 C) (Oral)  Ht 5' 0.5" (1.537 m)  Wt 147 lb (66.679 kg)  BMI 28.23 kg/m2        Assessment & Plan:  1. Hyperlipemia - POCT CBC - Lipid panel  2. Hypertension - POCT CBC - BMP8+EGFR - Hepatic function panel  3. Elevated blood sugar - POCT CBC - POCT glycosylated hemoglobin (Hb A1C)  4. Osteoarthritis - POCT CBC  5. Vitamin D deficiency - POCT CBC - Vit D  25 hydroxy (rtn osteoporosis monitoring)  6. Metabolic syndrome - POCT CBC - POCT glycosylated hemoglobin (Hb A1C)  7. Hearing deficit  8. Constipation, chronic - Linaclotide (LINZESS) 145 MCG CAPS capsule; Take 1 capsule (145 mcg total) by mouth daily.  Dispense: 30 capsule; Refill: 11  Patient Instructions  Continue current medications. Continue good therapeutic lifestyle changes which include good diet and exercise. Fall precautions discussed with patient. Schedule your flu vaccine if you haven't had it yet If you are over 24 years old - you may need Prevnar 67 or the adult Pneumonia vaccine. Try to walk and get as much activity as possible Try the linzess sample 1 daily one half hour before breakfast, works you have a prescription that you made refill--this is for the chronic constipation Continue to drink plenty of fluids   Arrie Senate MD

## 2013-05-20 NOTE — Patient Instructions (Addendum)
Continue current medications. Continue good therapeutic lifestyle changes which include good diet and exercise. Fall precautions discussed with patient. Schedule your flu vaccine if you haven't had it yet If you are over 78 years old - you may need Prevnar 85 or the adult Pneumonia vaccine. Try to walk and get as much activity as possible Try the linzess sample 1 daily one half hour before breakfast, works you have a prescription that you made refill--this is for the chronic constipation Continue to drink plenty of fluids

## 2013-05-21 ENCOUNTER — Other Ambulatory Visit (INDEPENDENT_AMBULATORY_CARE_PROVIDER_SITE_OTHER): Payer: Medicare Other

## 2013-05-21 DIAGNOSIS — R7989 Other specified abnormal findings of blood chemistry: Secondary | ICD-10-CM | POA: Diagnosis not present

## 2013-05-21 LAB — BMP8+EGFR
BUN/Creatinine Ratio: 23 (ref 11–26)
BUN: 18 mg/dL (ref 10–36)
CALCIUM: 9.7 mg/dL (ref 8.7–10.3)
CO2: 25 mmol/L (ref 18–29)
CREATININE: 0.78 mg/dL (ref 0.57–1.00)
Chloride: 105 mmol/L (ref 97–108)
GFR, EST AFRICAN AMERICAN: 74 mL/min/{1.73_m2} (ref >59)
GFR, EST NON AFRICAN AMERICAN: 64 mL/min/{1.73_m2} (ref >59)
Glucose: 117 mg/dL — ABNORMAL HIGH (ref 65–99)
POTASSIUM: 6.3 mmol/L — AB (ref 3.5–5.2)
SODIUM: 145 mmol/L — AB (ref 134–144)

## 2013-05-21 LAB — LIPID PANEL
CHOLESTEROL TOTAL: 141 mg/dL (ref 100–199)
Chol/HDL Ratio: 2.8 ratio units (ref 0.0–4.4)
HDL: 50 mg/dL (ref >39)
LDL CALC: 59 mg/dL (ref 0–99)
Triglycerides: 159 mg/dL — ABNORMAL HIGH (ref 0–149)
VLDL CHOLESTEROL CAL: 32 mg/dL (ref 5–40)

## 2013-05-21 LAB — HEPATIC FUNCTION PANEL
ALK PHOS: 91 IU/L (ref 39–117)
ALT: 11 IU/L (ref 0–32)
AST: 12 IU/L (ref 0–40)
Albumin: 4.1 g/dL (ref 3.2–4.6)
BILIRUBIN DIRECT: 0.15 mg/dL (ref 0.00–0.40)
BILIRUBIN TOTAL: 0.4 mg/dL (ref 0.0–1.2)
Total Protein: 6.2 g/dL (ref 6.0–8.5)

## 2013-05-21 LAB — VITAMIN D 25 HYDROXY (VIT D DEFICIENCY, FRACTURES): VIT D 25 HYDROXY: 40.8 ng/mL (ref 30.0–100.0)

## 2013-05-22 LAB — BMP8+EGFR
BUN / CREAT RATIO: 27 — AB (ref 11–26)
BUN: 20 mg/dL (ref 10–36)
CHLORIDE: 104 mmol/L (ref 97–108)
CO2: 25 mmol/L (ref 18–29)
Calcium: 8.6 mg/dL — ABNORMAL LOW (ref 8.7–10.3)
Creatinine, Ser: 0.75 mg/dL (ref 0.57–1.00)
GFR calc non Af Amer: 67 mL/min/{1.73_m2} (ref >59)
GFR, EST AFRICAN AMERICAN: 78 mL/min/{1.73_m2} (ref >59)
Glucose: 109 mg/dL — ABNORMAL HIGH (ref 65–99)
POTASSIUM: 4.4 mmol/L (ref 3.5–5.2)
Sodium: 142 mmol/L (ref 134–144)

## 2013-06-16 ENCOUNTER — Telehealth: Payer: Self-pay | Admitting: Family Medicine

## 2013-06-16 NOTE — Telephone Encounter (Signed)
Pt notifed and aware rx had refills

## 2013-06-17 ENCOUNTER — Telehealth: Payer: Self-pay | Admitting: Family Medicine

## 2013-06-17 NOTE — Telephone Encounter (Signed)
Notified RX was called into [place] Pt notified

## 2013-07-29 ENCOUNTER — Other Ambulatory Visit: Payer: Self-pay | Admitting: Family Medicine

## 2013-08-25 ENCOUNTER — Encounter: Payer: Self-pay | Admitting: Family Medicine

## 2013-08-25 ENCOUNTER — Ambulatory Visit (INDEPENDENT_AMBULATORY_CARE_PROVIDER_SITE_OTHER): Payer: Medicare Other | Admitting: Family Medicine

## 2013-08-25 VITALS — BP 178/80 | HR 81 | Temp 96.7°F | Ht 60.5 in | Wt 146.0 lb

## 2013-08-25 DIAGNOSIS — R42 Dizziness and giddiness: Secondary | ICD-10-CM

## 2013-08-25 DIAGNOSIS — H8309 Labyrinthitis, unspecified ear: Secondary | ICD-10-CM

## 2013-08-25 DIAGNOSIS — I10 Essential (primary) hypertension: Secondary | ICD-10-CM | POA: Diagnosis not present

## 2013-08-25 DIAGNOSIS — H6120 Impacted cerumen, unspecified ear: Secondary | ICD-10-CM

## 2013-08-25 DIAGNOSIS — N390 Urinary tract infection, site not specified: Secondary | ICD-10-CM | POA: Diagnosis not present

## 2013-08-25 DIAGNOSIS — M1990 Unspecified osteoarthritis, unspecified site: Secondary | ICD-10-CM | POA: Diagnosis not present

## 2013-08-25 DIAGNOSIS — H6122 Impacted cerumen, left ear: Secondary | ICD-10-CM

## 2013-08-25 LAB — POCT CBC
Granulocyte percent: 74.7 %G (ref 37–80)
HEMATOCRIT: 40.1 % (ref 37.7–47.9)
Hemoglobin: 13.3 g/dL (ref 12.2–16.2)
Lymph, poc: 1.5 (ref 0.6–3.4)
MCH, POC: 31.1 pg (ref 27–31.2)
MCHC: 33.1 g/dL (ref 31.8–35.4)
MCV: 94 fL (ref 80–97)
MPV: 7.6 fL (ref 0–99.8)
PLATELET COUNT, POC: 237 10*3/uL (ref 142–424)
POC Granulocyte: 5.4 (ref 2–6.9)
POC LYMPH PERCENT: 20.9 %L (ref 10–50)
RBC: 4.3 M/uL (ref 4.04–5.48)
RDW, POC: 12.4 %
WBC: 7.2 10*3/uL (ref 4.6–10.2)

## 2013-08-25 LAB — POCT URINALYSIS DIPSTICK
BILIRUBIN UA: NEGATIVE
Blood, UA: NEGATIVE
Glucose, UA: NEGATIVE
Ketones, UA: NEGATIVE
Nitrite, UA: NEGATIVE
Protein, UA: NEGATIVE
UROBILINOGEN UA: NEGATIVE
pH, UA: 6

## 2013-08-25 LAB — POCT UA - MICROSCOPIC ONLY
Casts, Ur, LPF, POC: NEGATIVE
Crystals, Ur, HPF, POC: NEGATIVE
Mucus, UA: NEGATIVE
Yeast, UA: NEGATIVE

## 2013-08-25 MED ORDER — MECLIZINE HCL 12.5 MG PO TABS: ORAL_TABLET | ORAL | Status: DC

## 2013-08-25 NOTE — Patient Instructions (Addendum)
Be careful, move slowly, stand for a few seconds before beginning to walk Drink plenty of water Remove throw rugs and keep a night light on to prevent you from falling Take medication as directed, take regularly for one week with meals and as needed Flonase over-the-counter, one spray each nostril at bedtime

## 2013-08-25 NOTE — Addendum Note (Signed)
Addended by: Earlene Plater on: 08/25/2013 04:56 PM   Modules accepted: Orders

## 2013-08-25 NOTE — Progress Notes (Signed)
Subjective:    Patient ID: Marissa Burns, female    DOB: 1914-08-21, 80 y.o.   MRN: [id number]  HPI Patient here today for dizziness and elevated BP since switching approx 3 months ago from Lotrel 5/20 to Amlodipine 5mg  and Benazepril 20mg  each daily. This change in medication was secondary to insurance not covering the combination pill. The patient comes to the visit today with Marissa Burns sister. Marissa Burns initial blood pressure was 178/80. Repeat blood pressure was 158/60. Marissa Burns weight is actually down 1 pound from the previous visit.      Patient Active Problem List   Diagnosis Date Noted  . Hearing deficit 05/20/2013  . Hypertension 10/30/2012  . Hyperlipemia 10/30/2012  . Vitamin D deficiency 10/30/2012  . Arthritis 10/30/2012  . Colon polyp   . Diverticulosis   . Osteoarthritis   . Edema   . Osteoporosis   . Elevated blood sugar   . Cataract    Outpatient Encounter Prescriptions as of 08/25/2013  Medication Sig  . acetaminophen (TYLENOL) 500 MG tablet Take 500 mg by mouth daily after lunch.  Marland Kitchen amLODipine (NORVASC) 5 MG tablet Take 1 tablet (5 mg total) by mouth daily.  Marland Kitchen aspirin 81 MG EC tablet Take 81 mg by mouth daily after breakfast.   . atorvastatin (LIPITOR) 80 MG tablet TAKE ONE TABLET BY MOUTH ONE  TIME DAILY  . benazepril (LOTENSIN) 20 MG tablet Take 1 tablet (20 mg total) by mouth daily.  . busPIRone (BUSPAR) 5 MG tablet Take 1 tablet (5 mg total) by mouth 2 (two) times daily.  . calcium carbonate (TUMS - DOSED IN MG ELEMENTAL CALCIUM) 500 MG chewable tablet Chew 1 tablet by mouth daily.    . Cholecalciferol (VITAMIN D3) 2000 UNITS TABS Take 1 tablet by mouth daily after breakfast.   . furosemide (LASIX) 20 MG tablet Take 20 mg by mouth daily after breakfast.   . lidocaine (LIDODERM) 5 % Place 1 patch onto the skin daily. Remove & Discard patch within 12 hours or as directed by MD  . Linaclotide (LINZESS) 145 MCG CAPS capsule Take 1 capsule (145 mcg total) by mouth daily.    . [DISCONTINUED] polyethylene glycol powder (GLYCOLAX/MIRALAX) powder Put a scoop of Miralax in a glass of water and drink it, twice a day, every day, to treat constipation.    Review of Systems  Constitutional: Negative.   HENT: Negative.   Eyes: Negative.   Respiratory: Negative.   Cardiovascular: Negative.   Gastrointestinal: Negative.   Endocrine: Negative.   Genitourinary: Negative.   Musculoskeletal: Negative.   Skin: Negative.   Allergic/Immunologic: Negative.   Neurological: Positive for dizziness. Negative for headaches.  Hematological: Negative.   Psychiatric/Behavioral: Negative.        Objective:   Physical Exam  Nursing note and vitals reviewed. Constitutional: She is oriented to person, place, and time. She appears well-developed and well-nourished. No distress.  Patient looks very good for Marissa Burns stated age of 80 years.  HENT:  Head: Normocephalic and atraumatic.  Right Ear: External ear normal.  Nose: Nose normal.  Mouth/Throat: Oropharynx is clear and moist.  She wears a hearing aid in the left ear canal and there is cerumen in the ear canal.  Eyes: Conjunctivae and EOM are normal. Pupils are equal, round, and reactive to light. Right eye exhibits no discharge. Left eye exhibits no discharge. No scleral icterus.  Neck: Normal range of motion. Neck supple. No thyromegaly present.  No carotid bruits  Cardiovascular: Normal  rate, regular rhythm and normal heart sounds.  Exam reveals no gallop and no friction rub.   No murmur heard. At 72 per minute.  Pulmonary/Chest: Effort normal and breath sounds normal. No respiratory distress. She has no wheezes. She has no rales. She exhibits no tenderness.  Abdominal: Soft. Bowel sounds are normal. She exhibits no mass. There is no tenderness. There is no rebound and no guarding.  No suprapubic tenderness  Musculoskeletal: She exhibits no edema and no tenderness.  The range of motion is limited due to the arthritis in both  knees.  Lymphadenopathy:    She has no cervical adenopathy.  Neurological: She is alert and oriented to person, place, and time. No cranial nerve deficit.  Turning head from left to right and looking up and down reproduce the dizziness that she has been having.  Skin: Skin is warm and dry. No rash noted.  Psychiatric: She has a normal mood and affect. Marissa Burns behavior is normal. Judgment and thought content normal.   BP 178/80  Pulse 81  Temp(Src) 96.7 F (35.9 C) (Oral)  Ht 5' 0.5" (1.537 m)  Wt 146 lb (66.225 kg)  BMI 28.03 kg/m2  Gentle irrigation of the left ear was done without complication       Assessment & Plan:  1. Hypertension  2. Osteoarthritis  3. Labyrinthitis -Antivert 12.5 mg, 13 times daily with food for 7 days then as needed  4. Left ear impacted cerumen -Ear irrigation  Patient Instructions  Be careful, move slowly, stand for a few seconds before beginning to walk Drink plenty of water Remove throw rugs and keep a night light on to prevent you from falling Take medication as directed, take regularly for one week with meals and as needed Flonase over-the-counter, one spray each nostril at bedtime   Arrie Senate MD

## 2013-08-25 NOTE — Addendum Note (Signed)
Addended by: Zannie Cove on: 08/25/2013 09:07 AM   Modules accepted: Orders

## 2013-08-26 ENCOUNTER — Telehealth: Payer: Self-pay | Admitting: Family Medicine

## 2013-08-26 LAB — BMP8+EGFR
BUN / CREAT RATIO: 22 (ref 11–26)
BUN: 15 mg/dL (ref 10–36)
CO2: 25 mmol/L (ref 18–29)
CREATININE: 0.68 mg/dL (ref 0.57–1.00)
Calcium: 8.9 mg/dL (ref 8.7–10.3)
Chloride: 101 mmol/L (ref 97–108)
GFR calc non Af Amer: 74 mL/min/{1.73_m2} (ref >59)
GFR, EST AFRICAN AMERICAN: 85 mL/min/{1.73_m2} (ref >59)
Glucose: 116 mg/dL — ABNORMAL HIGH (ref 65–99)
Potassium: 4.4 mmol/L (ref 3.5–5.2)
Sodium: 142 mmol/L (ref 134–144)

## 2013-08-26 NOTE — Telephone Encounter (Signed)
Message copied by Waverly Ferrari on Tue Aug 26, 2013  8:32 AM ------      Message from: Chipper Herb      Created: Tue Aug 26, 2013  7:02 AM       Please call her sister Milford Cage with the results of this lab work      The blood sugar is elevated at 116, but this is consistent with past readings. The creatinine, the most important kidney function test is good ------

## 2013-08-26 NOTE — Telephone Encounter (Signed)
I spoke with [name] and explained that it can cause drowsiness. I also informed her that if she had any other concerns to please give [place] a call back.

## 2013-08-27 LAB — URINE CULTURE

## 2013-08-28 ENCOUNTER — Other Ambulatory Visit: Payer: Medicare Other

## 2013-08-28 DIAGNOSIS — N390 Urinary tract infection, site not specified: Secondary | ICD-10-CM

## 2013-08-30 ENCOUNTER — Other Ambulatory Visit: Payer: Self-pay | Admitting: Family Medicine

## 2013-08-30 LAB — URINE CULTURE

## 2013-08-30 MED ORDER — CIPROFLOXACIN HCL 250 MG PO TABS: 250.0000 mg | ORAL_TABLET | Freq: Two times a day (BID) | ORAL | Status: DC

## 2013-09-17 ENCOUNTER — Ambulatory Visit: Payer: PRIVATE HEALTH INSURANCE | Admitting: Family Medicine

## 2013-09-20 ENCOUNTER — Encounter (HOSPITAL_COMMUNITY): Payer: Self-pay | Admitting: Emergency Medicine

## 2013-09-20 ENCOUNTER — Emergency Department (HOSPITAL_COMMUNITY): Payer: Medicare Other

## 2013-09-20 ENCOUNTER — Emergency Department (HOSPITAL_COMMUNITY)
Admission: EM | Admit: 2013-09-20 | Discharge: 2013-09-20 | Disposition: A | Discharge status: Home / Self Care | Payer: Medicare Other | Attending: Emergency Medicine | Admitting: Emergency Medicine

## 2013-09-20 DIAGNOSIS — Z7982 Long term (current) use of aspirin: Secondary | ICD-10-CM | POA: Insufficient documentation

## 2013-09-20 DIAGNOSIS — R1084 Generalized abdominal pain: Secondary | ICD-10-CM | POA: Diagnosis not present

## 2013-09-20 DIAGNOSIS — Z872 Personal history of diseases of the skin and subcutaneous tissue: Secondary | ICD-10-CM | POA: Diagnosis not present

## 2013-09-20 DIAGNOSIS — Z8719 Personal history of other diseases of the digestive system: Secondary | ICD-10-CM | POA: Diagnosis not present

## 2013-09-20 DIAGNOSIS — N281 Cyst of kidney, acquired: Secondary | ICD-10-CM | POA: Diagnosis not present

## 2013-09-20 DIAGNOSIS — K5900 Constipation, unspecified: Secondary | ICD-10-CM | POA: Diagnosis not present

## 2013-09-20 DIAGNOSIS — M1990 Unspecified osteoarthritis, unspecified site: Secondary | ICD-10-CM | POA: Diagnosis not present

## 2013-09-20 DIAGNOSIS — Z860100 Personal history of colon polyps, unspecified: Secondary | ICD-10-CM | POA: Insufficient documentation

## 2013-09-20 DIAGNOSIS — R6889 Other general symptoms and signs: Secondary | ICD-10-CM | POA: Diagnosis not present

## 2013-09-20 DIAGNOSIS — Z8669 Personal history of other diseases of the nervous system and sense organs: Secondary | ICD-10-CM | POA: Diagnosis not present

## 2013-09-20 DIAGNOSIS — I10 Essential (primary) hypertension: Secondary | ICD-10-CM | POA: Insufficient documentation

## 2013-09-20 DIAGNOSIS — R109 Unspecified abdominal pain: Secondary | ICD-10-CM | POA: Insufficient documentation

## 2013-09-20 DIAGNOSIS — R638 Other symptoms and signs concerning food and fluid intake: Secondary | ICD-10-CM | POA: Insufficient documentation

## 2013-09-20 DIAGNOSIS — E7800 Pure hypercholesterolemia, unspecified: Secondary | ICD-10-CM | POA: Diagnosis not present

## 2013-09-20 DIAGNOSIS — Z79899 Other long term (current) drug therapy: Secondary | ICD-10-CM | POA: Diagnosis not present

## 2013-09-20 DIAGNOSIS — K5730 Diverticulosis of large intestine without perforation or abscess without bleeding: Secondary | ICD-10-CM | POA: Diagnosis not present

## 2013-09-20 LAB — COMPREHENSIVE METABOLIC PANEL
ALBUMIN: 3.4 g/dL — AB (ref 3.5–5.2)
ALK PHOS: 80 U/L (ref 39–117)
ALT: 9 U/L (ref 0–35)
AST: 16 U/L (ref 0–37)
BUN: 19 mg/dL (ref 6–23)
CO2: 26 mEq/L (ref 19–32)
Calcium: 8.6 mg/dL (ref 8.4–10.5)
Chloride: 105 mEq/L (ref 96–112)
Creatinine, Ser: 0.66 mg/dL (ref 0.50–1.10)
GFR calc Af Amer: 84 mL/min — ABNORMAL LOW (ref >90)
GFR calc non Af Amer: 72 mL/min — ABNORMAL LOW (ref >90)
GLUCOSE: 111 mg/dL — AB (ref 70–99)
POTASSIUM: 3.9 meq/L (ref 3.7–5.3)
Sodium: 143 mEq/L (ref 137–147)
TOTAL PROTEIN: 6.3 g/dL (ref 6.0–8.3)
Total Bilirubin: 0.4 mg/dL (ref 0.3–1.2)

## 2013-09-20 LAB — CBC WITH DIFFERENTIAL/PLATELET
BASOS PCT: 0 % (ref 0–1)
Basophils Absolute: 0 10*3/uL (ref 0.0–0.1)
Eosinophils Absolute: 0.1 10*3/uL (ref 0.0–0.7)
Eosinophils Relative: 1 % (ref 0–5)
HEMATOCRIT: 38.6 % (ref 36.0–46.0)
Hemoglobin: 13.1 g/dL (ref 12.0–15.0)
LYMPHS ABS: 1.5 10*3/uL (ref 0.7–4.0)
Lymphocytes Relative: 23 % (ref 12–46)
MCH: 32.3 pg (ref 26.0–34.0)
MCHC: 33.9 g/dL (ref 30.0–36.0)
MCV: 95.1 fL (ref 78.0–100.0)
Monocytes Absolute: 0.6 10*3/uL (ref 0.1–1.0)
Monocytes Relative: 9 % (ref 3–12)
NEUTROS PCT: 67 % (ref 43–77)
Neutro Abs: 4.5 10*3/uL (ref 1.7–7.7)
PLATELETS: 240 10*3/uL (ref 150–400)
RBC: 4.06 MIL/uL (ref 3.87–5.11)
RDW: 12.4 % (ref 11.5–15.5)
WBC: 6.7 10*3/uL (ref 4.0–10.5)

## 2013-09-20 LAB — URINALYSIS, ROUTINE W REFLEX MICROSCOPIC
Bilirubin Urine: NEGATIVE
GLUCOSE, UA: NEGATIVE mg/dL
Hgb urine dipstick: NEGATIVE
Ketones, ur: NEGATIVE mg/dL
Nitrite: NEGATIVE
Protein, ur: NEGATIVE mg/dL
Urobilinogen, UA: 0.2 mg/dL (ref 0.0–1.0)
pH: 6.5 (ref 5.0–8.0)

## 2013-09-20 LAB — URINE MICROSCOPIC-ADD ON

## 2013-09-20 LAB — LIPASE, BLOOD: Lipase: 20 U/L (ref 11–59)

## 2013-09-20 LAB — I-STAT CG4 LACTIC ACID, ED: Lactic Acid, Venous: 1.01 mmol/L (ref 0.5–2.2)

## 2013-09-20 MED ORDER — ONDANSETRON HCL 4 MG/2ML IJ SOLN
INTRAMUSCULAR | Status: AC
  Administered 2013-09-20: 4 mg via INTRAVENOUS
  Filled 2013-09-20: qty 2

## 2013-09-20 MED ORDER — IOHEXOL 300 MG/ML  SOLN
100.0000 mL | Freq: Once | INTRAMUSCULAR | Status: AC | PRN
  Administered 2013-09-20: 100 mL via INTRAVENOUS

## 2013-09-20 MED ORDER — ONDANSETRON HCL 4 MG/2ML IJ SOLN
4.0000 mg | Freq: Once | INTRAMUSCULAR | Status: AC
  Administered 2013-09-20: 4 mg via INTRAVENOUS

## 2013-09-20 MED ORDER — IOHEXOL 300 MG/ML  SOLN
50.0000 mL | Freq: Once | INTRAMUSCULAR | Status: AC | PRN
  Administered 2013-09-20: 50 mL via ORAL

## 2013-09-20 NOTE — Discharge Instructions (Signed)

## 2013-09-20 NOTE — ED Provider Notes (Signed)
CSN: [id number]     Arrival date & time 09/20/13  1525 History  This chart was scribed for Sharyon Cable, MD,  by Stacy Gardner, ED Scribe. The patient was seen in room APA06/[facility]06 and the patient's care was started at 3:52 PM.    First MD Initiated Contact with Patient 09/20/13 1537     Chief Complaint  Patient presents with  . Abdominal Pain     Patient is a 80 y.o. female presenting with abdominal pain. The history is provided by the patient and medical records. No language interpreter was used.  Abdominal Pain Associated symptoms: constipation   Associated symptoms: no chest pain, no nausea, no shortness of breath and no vomiting    HPI Comments: Marissa Burns is a 80 y.o. female who presents to the Emergency Department complaining of abdominal pain. Pt has mild dysuria. She also has the associated symptoms of nausea and loss of appetite. The abdominal pain is worse with palpation. Pt mentions trying a suppository and this morning but that does not seem to help. She had an output of "hard balls" and fluid with a watery consistency.   Denies chest pain. Denies SOB. Denies fever. Denies vomiting.    Past Medical History  Diagnosis Date  . Essential hypertension, benign   . Colon polyp   . Other and unspecified hyperlipidemia   . Diverticulosis   . Osteoarthritis   . Edema   . Osteoporosis   . Elevated blood sugar   . Cataract   . High cholesterol    Past Surgical History  Procedure Laterality Date  . Cataract extraction    . Appendectomy    . Tonsillectomy     No family history on file. History  Substance Use Topics  . Smoking status: Never Smoker   . Smokeless tobacco: Not on file  . Alcohol Use: No   OB History   Grav Para Term Preterm Abortions TAB SAB Ect Mult Living                 Review of Systems  Constitutional: Positive for appetite change.  Respiratory: Negative for shortness of breath.   Cardiovascular: Negative for chest pain.   Gastrointestinal: Positive for abdominal pain and constipation. Negative for nausea and vomiting.  All other systems reviewed and are negative.     Allergies  Alendronate sodium; Celebrex; Dilacor; Evista; and Propulsid  Home Medications   Prior to Admission medications   Medication Sig Start Date End Date Taking? Authorizing Provider  amLODipine (NORVASC) 5 MG tablet Take 5 mg by mouth every morning.   Yes Historical Provider, MD  atorvastatin (LIPITOR) 80 MG tablet Take 80 mg by mouth daily. Takes at lunchtime   Yes Historical Provider, MD  benazepril (LOTENSIN) 20 MG tablet Take 20 mg by mouth every morning.   Yes Historical Provider, MD  furosemide (LASIX) 20 MG tablet Take 20 mg by mouth daily after breakfast.    Yes Historical Provider, MD  acetaminophen (TYLENOL) 500 MG tablet Take 500 mg by mouth daily after lunch.    Historical Provider, MD  aspirin 81 MG EC tablet Take 81 mg by mouth daily after breakfast.     Historical Provider, MD  busPIRone (BUSPAR) 5 MG tablet Take 1 tablet (5 mg total) by mouth 2 (two) times daily. 10/30/12   Chipper Herb, MD  calcium carbonate (TUMS - DOSED IN MG ELEMENTAL CALCIUM) 500 MG chewable tablet Chew 1 tablet by mouth daily.  Historical Provider, MD  Cholecalciferol (VITAMIN D3) 2000 UNITS TABS Take 1 tablet by mouth daily after breakfast.     Historical Provider, MD  Linaclotide (LINZESS) 145 MCG CAPS capsule Take 1 capsule (145 mcg total) by mouth daily. 05/20/13   Chipper Herb, MD  meclizine (ANTIVERT) 12.5 MG tablet Take 1   3 times daily with food for 7 days then when necessary 08/25/13   Chipper Herb, MD   BP 165/59  Pulse 84  Temp(Src) 98.4 F (36.9 C) (Oral)  Resp 20  Ht 5\' 2"  (1.575 m)  Wt 140 lb (63.504 kg)  BMI 25.60 kg/m2 Physical Exam CONSTITUTIONAL: Well developed/well nourished HEAD: Normocephalic/atraumatic EYES: EOMI/PERRL ENMT: Mucous membranes moist NECK: supple no meningeal signs SPINE:entire spine  nontender CV: S1/S2 noted, no murmurs/rubs/gallops noted LUNGS: Lungs are clear to auscultation bilaterally, no apparent distress ABDOMEN: soft, mild diffuse lower tenderness, no rebound or guarding GU:no cva tenderness RECTAL: no stool impaction. No mass or abscess. Yellow colored stool. Chaperone is present.  NEURO: Pt is awake/alert, moves all extremitiesx4 EXTREMITIES: pulses normal, full ROM SKIN: warm, color normal PSYCH: no abnormalities of mood noted   ED Course  Procedures  DIAGNOSTIC STUDIES: Oxygen Saturation is 99% on room air, normal by my interpretation.    COORDINATION OF CARE:  3:58 PM Discussed course of care with pt which includes rectal examination . Pt understands and agrees.  CT imaging advised due to age/history and concern for bowel obstruction  6:46 PM Pt improved CT imaging negative for acute disease Discussed appropriate use of outpatient laxatives Pt feels well for d/dc home   Labs Review Labs Reviewed  COMPREHENSIVE METABOLIC PANEL - Abnormal; Notable for the following:    Glucose, Bld 111 (*)    Albumin 3.4 (*)    GFR calc non Af Amer 72 (*)    GFR calc Af Amer 84 (*)    All other components within normal limits  URINALYSIS, ROUTINE W REFLEX MICROSCOPIC - Abnormal; Notable for the following:    Specific Gravity, Urine <1.005 (*)    Leukocytes, UA TRACE (*)    All other components within normal limits  CBC WITH DIFFERENTIAL  LIPASE, BLOOD  URINE MICROSCOPIC-ADD ON  I-STAT CG4 LACTIC ACID, ED    Imaging Review Ct Abdomen Pelvis W Contrast  09/20/2013   CLINICAL DATA:  Constipation, abdominal pain, history of diverticulosis  EXAM: CT ABDOMEN AND PELVIS WITH CONTRAST  TECHNIQUE: Multidetector CT imaging of the abdomen and pelvis was performed using the standard protocol following bolus administration of intravenous contrast.  CONTRAST:  163mL OMNIPAQUE IOHEXOL 300 MG/ML SOLN, 62mL OMNIPAQUE IOHEXOL 300 MG/ML SOLN  COMPARISON:  09/20/2010   FINDINGS: Lung bases are unremarkable. Sagittal images of the spine shows diffuse osteopenia. Degenerative changes are noted thoracolumbar spine. There is mild about 4 mm anterolisthesis L3 on L4 vertebral body.  Atherosclerotic calcifications of abdominal aorta and coronary arteries. The patient is status postcholecystectomy. Atherosclerotic calcifications of celiac trunk origin. SMA calcifications. Atherosclerotic calcifications of splenic artery.  No focal hepatic mass. Atrophic pancreas again noted. The spleen and adrenal glands are unremarkable. Kidneys are symmetrical in size and enhancement. No hydronephrosis or hydroureter. Right renal cysts are stable. Stable parapelvic left renal cysts. Stable parenchymal left renal cysts.  No aortic aneurysm. Atherosclerotic calcifications of abdominal aorta and iliac arteries. Delayed renal images shows bilateral renal symmetrical excretion. Bilateral visualized proximal ureter is unremarkable.  No small bowel obstruction. Multiple colonic diverticula are noted descending colon. Multiple  sigmoid colon diverticula are noted. There is no evidence of acute diverticulitis. The uterus is unchanged in appearance. No adnexal mass. The urinary bladder is unremarkable.  There is no pericecal inflammation. The terminal ileum is unremarkable. The patient is status post appendectomy. No destructive bony lesions are noted within pelvis. No inguinal adenopathy.  IMPRESSION: 1. Status postcholecystectomy. No acute inflammatory process within abdomen or pelvis. 2. Distal colonic diverticula are noted. No evidence of acute diverticulitis. 3. Stable bilateral renal cysts. No hydronephrosis or hydroureter. Bilateral renal symmetrical excretion. 4. No small bowel obstruction. No pericecal inflammation. Status post appendectomy.   Electronically Signed   By: Lahoma Crocker M.D.   On: 09/20/2013 17:55     EKG Interpretation   Date/Time:  Saturday September 20 2013 16:19:22 EDT Ventricular Rate:   78 PR Interval:  163 QRS Duration: 94 QT Interval:  390 QTC Calculation: 444 R Axis:   72 Text Interpretation:  Sinus rhythm Atrial premature complex Low voltage,  precordial leads No significant change since last tracing Confirmed by  Christy Gentles  MD, Elenore Rota ([id number]) on 09/20/2013 4:33:52 PM      MDM   Final diagnoses:  Abdominal pain  Constipation    Nursing notes including past medical history and social history reviewed and considered in documentation Labs/vital reviewed and considered   I personally performed the services described in this documentation, which was scribed in my presence. The recorded information has been reviewed and is accurate.      Sharyon Cable, MD 09/20/13 [phone]

## 2013-09-20 NOTE — ED Notes (Signed)
EMS reports pt c/o constipation x 2 weeks.  Reports has tried numerous OTC remedies with no relief.    Reports her pcp gave her a pill but did not help.  Pt says this morning has used a suppository and only water and "hard balls" of stool came out.  C/O nausea as well.

## 2013-09-20 NOTE — ED Notes (Signed)
Patient with no complaints at this time. Respirations even and unlabored. Skin warm/dry. Discharge instructions reviewed with patient at this time. Patient given opportunity to voice concerns/ask questions. IV removed per policy and band-aid applied to site. Patient discharged at this time and left Emergency Department with steady gait.  

## 2013-09-22 ENCOUNTER — Telehealth: Payer: Self-pay | Admitting: Family Medicine

## 2013-09-23 NOTE — Telephone Encounter (Signed)
appt given for 10-14 day rck with [facility]- was seen in hosp.

## 2013-10-07 ENCOUNTER — Ambulatory Visit: Payer: Medicare Other | Admitting: Family Medicine

## 2013-10-08 ENCOUNTER — Encounter (INDEPENDENT_AMBULATORY_CARE_PROVIDER_SITE_OTHER): Payer: Self-pay

## 2013-10-08 ENCOUNTER — Encounter: Payer: Self-pay | Admitting: Family Medicine

## 2013-10-08 ENCOUNTER — Ambulatory Visit (INDEPENDENT_AMBULATORY_CARE_PROVIDER_SITE_OTHER): Payer: Medicare Other | Admitting: Family Medicine

## 2013-10-08 ENCOUNTER — Ambulatory Visit (INDEPENDENT_AMBULATORY_CARE_PROVIDER_SITE_OTHER): Payer: Medicare Other

## 2013-10-08 VITALS — BP 147/59 | HR 76 | Temp 98.4°F | Ht 62.0 in | Wt 147.0 lb

## 2013-10-08 DIAGNOSIS — M129 Arthropathy, unspecified: Secondary | ICD-10-CM

## 2013-10-08 DIAGNOSIS — M170 Bilateral primary osteoarthritis of knee: Secondary | ICD-10-CM | POA: Insufficient documentation

## 2013-10-08 DIAGNOSIS — E785 Hyperlipidemia, unspecified: Secondary | ICD-10-CM | POA: Diagnosis not present

## 2013-10-08 DIAGNOSIS — M1990 Unspecified osteoarthritis, unspecified site: Secondary | ICD-10-CM

## 2013-10-08 DIAGNOSIS — I10 Essential (primary) hypertension: Secondary | ICD-10-CM

## 2013-10-08 DIAGNOSIS — K5909 Other constipation: Secondary | ICD-10-CM | POA: Insufficient documentation

## 2013-10-08 DIAGNOSIS — E559 Vitamin D deficiency, unspecified: Secondary | ICD-10-CM

## 2013-10-08 DIAGNOSIS — R7309 Other abnormal glucose: Secondary | ICD-10-CM | POA: Diagnosis not present

## 2013-10-08 DIAGNOSIS — M1710 Unilateral primary osteoarthritis, unspecified knee: Secondary | ICD-10-CM

## 2013-10-08 DIAGNOSIS — R739 Hyperglycemia, unspecified: Secondary | ICD-10-CM

## 2013-10-08 DIAGNOSIS — K5900 Constipation, unspecified: Secondary | ICD-10-CM

## 2013-10-08 LAB — POCT CBC
Granulocyte percent: 77.5 %G (ref 37–80)
HEMATOCRIT: 38.3 % (ref 37.7–47.9)
Hemoglobin: 12.9 g/dL (ref 12.2–16.2)
LYMPH, POC: 1.8 (ref 0.6–3.4)
MCH: 31.5 pg — AB (ref 27–31.2)
MCHC: 33.7 g/dL (ref 31.8–35.4)
MCV: 93.4 fL (ref 80–97)
MPV: 7.8 fL (ref 0–99.8)
PLATELET COUNT, POC: 253 10*3/uL (ref 142–424)
POC Granulocyte: 6.8 (ref 2–6.9)
POC LYMPH PERCENT: 20.8 %L (ref 10–50)
RBC: 4.1 M/uL (ref 4.04–5.48)
RDW, POC: 13 %
WBC: 8.8 10*3/uL (ref 4.6–10.2)

## 2013-10-08 NOTE — Patient Instructions (Addendum)
Medicare Annual Wellness Visit  [facility] and the medical providers at [facility] strive to bring you the best medical care.  In doing so we not only want to address your current medical conditions and concerns but also to detect new conditions early and prevent illness, disease and health-related problems.    Medicare offers a yearly Wellness Visit which allows our clinical staff to assess your need for preventative services including immunizations, lifestyle education, counseling to decrease risk of preventable diseases and screening for fall risk and other medical concerns.    This visit is provided free of charge (no copay) for all Medicare recipients. The clinical pharmacists at [facility] have begun to conduct these Wellness Visits which will also include a thorough review of all your medications.    As you primary medical provider recommend that you make an appointment for your Annual Wellness Visit if you have not done so already this year.  You may set up this appointment before you leave today or you may call back ([phone]) and schedule an appointment.  Please make sure when you call that you mention that you are scheduling your Annual Wellness Visit with the clinical pharmacist so that the appointment may be made for the proper length of time.      Continue current medications. Continue good therapeutic lifestyle changes which include good diet and exercise. Fall precautions discussed with patient. If an FOBT was given today- please return it to our front desk. If you are over 3 years old - you may need Prevnar 97 or the adult Pneumonia vaccine.  Continue to take the linzess for your constipation Continue to drink plenty of fluids Continue to be careful and do not put yourself at risk for falling Continue to use your cane

## 2013-10-08 NOTE — Progress Notes (Signed)
Subjective:    Patient ID: Marissa Burns, female    DOB: 21-Aug-1914, 80 y.o.   MRN: [id number]  HPI Pt here for follow up and management of chronic medical problems. The patient had a recent hospital visit and constipation was the diagnosis and as long as she is taking medication for this she is doing better. She had a CT scan done in the constipation was the only finding. She is due to get a traditional lab panel today and a chest x-ray. She is doing better with the constipation. The patient is present and cooperative and presents herself well.        Patient Active Problem List   Diagnosis Date Noted  . Hearing deficit 05/20/2013  . Hypertension 10/30/2012  . Hyperlipemia 10/30/2012  . Vitamin D deficiency 10/30/2012  . Arthritis 10/30/2012  . Colon polyp   . Diverticulosis   . Osteoarthritis   . Edema   . Osteoporosis   . Elevated blood sugar   . Cataract    Outpatient Encounter Prescriptions as of 10/08/2013  Medication Sig  . acetaminophen (TYLENOL) 500 MG tablet Take 1,000 mg by mouth every morning.   Marland Kitchen amLODipine (NORVASC) 5 MG tablet Take 5 mg by mouth every morning.  Marland Kitchen aspirin 81 MG EC tablet Take 81 mg by mouth every morning.   Marland Kitchen atorvastatin (LIPITOR) 80 MG tablet Take 80 mg by mouth daily. Takes at lunchtime  . benazepril (LOTENSIN) 20 MG tablet Take 20 mg by mouth every morning.  . busPIRone (BUSPAR) 5 MG tablet Take 5 mg by mouth 2 (two) times daily as needed.  . calcium carbonate (TUMS - DOSED IN MG ELEMENTAL CALCIUM) 500 MG chewable tablet Chew 1 tablet by mouth daily.    . Cholecalciferol (VITAMIN D3) 2000 UNITS TABS Take 1 tablet by mouth daily after breakfast.   . furosemide (LASIX) 20 MG tablet Take 20 mg by mouth daily after breakfast.   . Linaclotide (LINZESS) 145 MCG CAPS capsule Take 1 capsule (145 mcg total) by mouth daily.    Review of Systems  Constitutional: Negative.   HENT: Negative.   Eyes: Negative.   Respiratory: Negative.     Cardiovascular: Negative.   Gastrointestinal: Negative.   Endocrine: Negative.   Genitourinary: Negative.   Musculoskeletal: Negative.   Skin: Negative.   Allergic/Immunologic: Negative.   Neurological: Negative.   Hematological: Negative.   Psychiatric/Behavioral: Negative.        Objective:   Physical Exam  Nursing note and vitals reviewed. Constitutional: She is oriented to person, place, and time. She appears well-developed and well-nourished. No distress.  Especially for her age of 74   HENT:  Head: Normocephalic and atraumatic.  Right Ear: External ear normal.  Left Ear: External ear normal.  Nose: Nose normal.  Mouth/Throat: Oropharynx is clear and moist.  She uses a hearing aid in the left ear  Eyes: Conjunctivae and EOM are normal. Pupils are equal, round, and reactive to light. Right eye exhibits no discharge. Left eye exhibits no discharge. No scleral icterus.  Neck: Normal range of motion. Neck supple. No thyromegaly present.  Cardiovascular: Normal rate, regular rhythm and intact distal pulses.  Exam reveals no gallop and no friction rub.   Murmur heard. At 84 per minute with a grade 2/6 systolic ejection murmur  Pulmonary/Chest: Effort normal and breath sounds normal. No respiratory distress. She has no wheezes. She has no rales. She exhibits no tenderness.  Abdominal: Soft. Bowel sounds are normal. She  exhibits no mass. There is no tenderness. There is no rebound and no guarding.  There was no abdominal tenderness or masses  Musculoskeletal: She exhibits tenderness (both knees joint lines). She exhibits no edema.  Movement is hesitant but good with her cane. She continues to have stiffness and degenerative problems with both knees  Lymphadenopathy:    She has no cervical adenopathy.  Neurological: She is alert and oriented to person, place, and time. She has normal reflexes. No cranial nerve deficit.  Skin: Skin is warm and dry. No rash noted.  Psychiatric: She  has a normal mood and affect. Her behavior is normal. Judgment and thought content normal.   BP 166/63  Pulse 76  Temp(Src) 98.4 F (36.9 C) (Oral)  Ht $R'5\' 2"'yK$  (1.575 m)  Wt 147 lb (66.679 kg)  BMI 26.88 kg/m2  [facility] reading (PRIMARY) by  [facility]-chest x-ray-   no active disease                                    Assessment & Plan:  1. Hyperlipemia - POCT CBC - Lipid panel  2. Essential hypertension - DG Chest 2 View; Future - POCT CBC - BMP8+EGFR - Hepatic function panel  3. Vitamin D deficiency - POCT CBC - Vit D  25 hydroxy (rtn osteoporosis monitoring)  4. Elevated blood sugar - POCT CBC  5. Arthritis - POCT CBC  6. Chronic constipation  7. Primary osteoarthritis of both knees Patient Instructions                       Medicare Annual Wellness Visit  [facility] and the medical providers at [facility] strive to bring you the best medical care.  In doing so we not only want to address your current medical conditions and concerns but also to detect new conditions early and prevent illness, disease and health-related problems.    Medicare offers a yearly Wellness Visit which allows our clinical staff to assess your need for preventative services including immunizations, lifestyle education, counseling to decrease risk of preventable diseases and screening for fall risk and other medical concerns.    This visit is provided free of charge (no copay) for all Medicare recipients. The clinical pharmacists at [facility] have begun to conduct these Wellness Visits which will also include a thorough review of all your medications.    As you primary medical provider recommend that you make an appointment for your Annual Wellness Visit if you have not done so already this year.  You may set up this appointment before you leave today or you may call back ([phone]) and schedule an appointment.  Please make sure when you call that you  mention that you are scheduling your Annual Wellness Visit with the clinical pharmacist so that the appointment may be made for the proper length of time.      Continue current medications. Continue good therapeutic lifestyle changes which include good diet and exercise. Fall precautions discussed with patient. If an FOBT was given today- please return it to our front desk. If you are over 28 years old - you may need Prevnar 46 or the adult Pneumonia vaccine.  Continue to take the linzess for your constipation Continue to drink plenty of fluids Continue to be careful and do not put yourself at risk for falling Continue to use your cane  Arrie Senate MD

## 2013-10-09 ENCOUNTER — Telehealth: Payer: Self-pay

## 2013-10-09 LAB — BMP8+EGFR
BUN / CREAT RATIO: 23 (ref 11–26)
BUN: 18 mg/dL (ref 10–36)
CO2: 21 mmol/L (ref 18–29)
Calcium: 8.8 mg/dL (ref 8.7–10.3)
Chloride: 105 mmol/L (ref 97–108)
Creatinine, Ser: 0.79 mg/dL (ref 0.57–1.00)
GFR calc non Af Amer: 63 mL/min/{1.73_m2} (ref >59)
GFR, EST AFRICAN AMERICAN: 73 mL/min/{1.73_m2} (ref >59)
Glucose: 104 mg/dL — ABNORMAL HIGH (ref 65–99)
Potassium: 4.3 mmol/L (ref 3.5–5.2)
SODIUM: 143 mmol/L (ref 134–144)

## 2013-10-09 LAB — HEPATIC FUNCTION PANEL
ALBUMIN: 4 g/dL (ref 3.2–4.6)
ALK PHOS: 86 IU/L (ref 39–117)
ALT: 11 IU/L (ref 0–32)
AST: 11 IU/L (ref 0–40)
Bilirubin, Direct: 0.11 mg/dL (ref 0.00–0.40)
Total Bilirubin: 0.3 mg/dL (ref 0.0–1.2)
Total Protein: 5.9 g/dL — ABNORMAL LOW (ref 6.0–8.5)

## 2013-10-09 LAB — VITAMIN D 25 HYDROXY (VIT D DEFICIENCY, FRACTURES): VIT D 25 HYDROXY: 42.6 ng/mL (ref 30.0–100.0)

## 2013-10-09 LAB — LIPID PANEL
CHOL/HDL RATIO: 2.8 ratio (ref 0.0–4.4)
Cholesterol, Total: 132 mg/dL (ref 100–199)
HDL: 47 mg/dL (ref >39)
LDL Calculated: 46 mg/dL (ref 0–99)
Triglycerides: 197 mg/dL — ABNORMAL HIGH (ref 0–149)
VLDL Cholesterol Cal: 39 mg/dL (ref 5–40)

## 2013-10-09 NOTE — Telephone Encounter (Signed)
Pt's Sister aware of cxr results

## 2013-10-09 NOTE — Telephone Encounter (Signed)
Message copied by Koren Bound on Thu Oct 09, 2013  8:01 AM ------      Message from: Chipper Herb      Created: Wed Oct 08, 2013  5:38 PM       As per radiology report----please call her sister, Milford Cage with the results of this report ------

## 2013-10-13 ENCOUNTER — Ambulatory Visit: Payer: Medicare Other | Admitting: Family Medicine

## 2013-10-28 ENCOUNTER — Other Ambulatory Visit: Payer: Self-pay | Admitting: Family Medicine

## 2013-11-17 ENCOUNTER — Other Ambulatory Visit: Payer: Self-pay | Admitting: Family Medicine

## 2013-11-18 ENCOUNTER — Telehealth: Payer: Self-pay | Admitting: Family Medicine

## 2013-11-18 NOTE — Telephone Encounter (Signed)
Pt notified Rx done and verified with [facility].

## 2013-11-30 ENCOUNTER — Other Ambulatory Visit: Payer: Self-pay | Admitting: Family Medicine

## 2013-12-30 DIAGNOSIS — Z961 Presence of intraocular lens: Secondary | ICD-10-CM | POA: Diagnosis not present

## 2013-12-30 DIAGNOSIS — H02839 Dermatochalasis of unspecified eye, unspecified eyelid: Secondary | ICD-10-CM | POA: Diagnosis not present

## 2013-12-30 DIAGNOSIS — H1045 Other chronic allergic conjunctivitis: Secondary | ICD-10-CM | POA: Diagnosis not present

## 2013-12-30 DIAGNOSIS — H04129 Dry eye syndrome of unspecified lacrimal gland: Secondary | ICD-10-CM | POA: Diagnosis not present

## 2013-12-30 DIAGNOSIS — H353190 Nonexudative age-related macular degeneration, unspecified eye, stage unspecified: Secondary | ICD-10-CM | POA: Diagnosis not present

## 2014-01-22 ENCOUNTER — Encounter: Payer: Self-pay | Admitting: Family Medicine

## 2014-01-22 ENCOUNTER — Ambulatory Visit (INDEPENDENT_AMBULATORY_CARE_PROVIDER_SITE_OTHER): Payer: Medicare Other | Admitting: Family Medicine

## 2014-01-22 VITALS — BP 146/65 | HR 71 | Temp 97.5°F | Ht 62.0 in | Wt 144.0 lb

## 2014-01-22 DIAGNOSIS — H547 Unspecified visual loss: Secondary | ICD-10-CM | POA: Insufficient documentation

## 2014-01-22 DIAGNOSIS — M170 Bilateral primary osteoarthritis of knee: Secondary | ICD-10-CM

## 2014-01-22 DIAGNOSIS — M1990 Unspecified osteoarthritis, unspecified site: Secondary | ICD-10-CM | POA: Diagnosis not present

## 2014-01-22 DIAGNOSIS — R7309 Other abnormal glucose: Secondary | ICD-10-CM

## 2014-01-22 DIAGNOSIS — E559 Vitamin D deficiency, unspecified: Secondary | ICD-10-CM

## 2014-01-22 DIAGNOSIS — Z23 Encounter for immunization: Secondary | ICD-10-CM

## 2014-01-22 DIAGNOSIS — R739 Hyperglycemia, unspecified: Secondary | ICD-10-CM

## 2014-01-22 DIAGNOSIS — I10 Essential (primary) hypertension: Secondary | ICD-10-CM | POA: Diagnosis not present

## 2014-01-22 DIAGNOSIS — E785 Hyperlipidemia, unspecified: Secondary | ICD-10-CM

## 2014-01-22 DIAGNOSIS — R2689 Other abnormalities of gait and mobility: Secondary | ICD-10-CM

## 2014-01-22 LAB — POCT CBC
Granulocyte percent: 75.8 %G (ref 37–80)
HCT, POC: 40.8 % (ref 37.7–47.9)
Hemoglobin: 13.2 g/dL (ref 12.2–16.2)
LYMPH, POC: 1.6 (ref 0.6–3.4)
MCH, POC: 30.5 pg (ref 27–31.2)
MCHC: 32.4 g/dL (ref 31.8–35.4)
MCV: 94 fL (ref 80–97)
MPV: 8 fL (ref 0–99.8)
POC GRANULOCYTE: 5.6 (ref 2–6.9)
POC LYMPH %: 21.5 % (ref 10–50)
Platelet Count, POC: 268 10*3/uL (ref 142–424)
RBC: 4.3 M/uL (ref 4.04–5.48)
RDW, POC: 12.8 %
WBC: 7.4 10*3/uL (ref 4.6–10.2)

## 2014-01-22 NOTE — Progress Notes (Signed)
Subjective:    Patient ID: Marissa Burns, female    DOB: 29-Aug-1914, 80 y.o.   MRN: [id number]  HPI Pt here for follow up and management of chronic medical problems. The patient still complains of the arthritic pain in both knees. She is due to return in FOBT, get a flu shot today, and get lab work. Her sister brings her to the visit today. She is pleasant and alert for her age and her biggest complaint is the arthritis in her knees.       Patient Active Problem List   Diagnosis Date Noted  . Chronic constipation 10/08/2013  . Primary osteoarthritis of both knees 10/08/2013  . Hearing deficit 05/20/2013  . Hypertension 10/30/2012  . Hyperlipemia 10/30/2012  . Vitamin D deficiency 10/30/2012  . Arthritis 10/30/2012  . Colon polyp   . Diverticulosis   . Osteoarthritis   . Edema   . Osteoporosis   . Elevated blood sugar   . Cataract    Outpatient Encounter Prescriptions as of 01/22/2014  Medication Sig  . acetaminophen (TYLENOL) 500 MG tablet Take 1,000 mg by mouth every morning.   Marland Kitchen amLODipine (NORVASC) 5 MG tablet TAKE ONE TABLET BY MOUTH ONE TIME DAILY  . aspirin 81 MG EC tablet Take 81 mg by mouth every morning.   Marland Kitchen atorvastatin (LIPITOR) 80 MG tablet TAKE ONE TABLET BY MOUTH ONE TIME DAILY  . benazepril (LOTENSIN) 20 MG tablet TAKE 1 TABLET (20 MG TOTAL) BY MOUTH DAILY.  . busPIRone (BUSPAR) 5 MG tablet TAKE ONE TABLET BY MOUTH TWICE DAILY AS NEEDED FOR ANXIETY  . calcium carbonate (TUMS - DOSED IN MG ELEMENTAL CALCIUM) 500 MG chewable tablet Chew 1 tablet by mouth daily.    . Cholecalciferol (VITAMIN D3) 2000 UNITS TABS Take 1 tablet by mouth daily after breakfast.   . furosemide (LASIX) 20 MG tablet TAKE ONE TABLET BY MOUTH ONE TIME DAILY  . Linaclotide (LINZESS) 145 MCG CAPS capsule Take 1 capsule (145 mcg total) by mouth daily.    Review of Systems  Constitutional: Negative.   HENT: Negative.   Eyes: Negative.   Respiratory: Negative.   Cardiovascular:  Negative.   Gastrointestinal: Negative.   Endocrine: Negative.   Genitourinary: Negative.   Musculoskeletal: Positive for arthralgias (bilateral knee pain- worse with movement).  Skin: Negative.   Allergic/Immunologic: Negative.   Neurological: Negative.   Hematological: Negative.   Psychiatric/Behavioral: Negative.        Objective:   Physical Exam  Nursing note and vitals reviewed. Constitutional: She is oriented to person, place, and time. She appears well-developed and well-nourished.  The patient is pleasant and alert and looks and acts younger than her stated age of 33. She worries about the loss of her 2 sons and is very dependent upon her sister who is 36  HENT:  Head: Normocephalic and atraumatic.  Right Ear: External ear normal.  Left Ear: External ear normal.  Nose: Nose normal.  Mouth/Throat: Oropharynx is clear and moist. No oropharyngeal exudate.  Eyes: Conjunctivae and EOM are normal. Pupils are equal, round, and reactive to light. Right eye exhibits no discharge. Left eye exhibits no discharge. No scleral icterus.  Neck: Normal range of motion. Neck supple. No JVD present. No thyromegaly present.  The patient has no carotid bruits  Cardiovascular: Normal rate, regular rhythm and intact distal pulses.  Exam reveals no gallop and no friction rub.   Murmur (there is a systolic ejection murmur grade 2/6) heard. Pulmonary/Chest: Effort  normal and breath sounds normal. No respiratory distress. She has no wheezes. She has no rales. She exhibits no tenderness.  Abdominal: Soft. Bowel sounds are normal. She exhibits no mass. There is no tenderness. There is no rebound and no guarding.  Musculoskeletal: She exhibits no edema and no tenderness.  The patient's gait instability is secondary to the severe arthritis that she has in both knees. She is tender especially at the medial joint line of both knees.  Lymphadenopathy:    She has no cervical adenopathy.  Neurological: She is  alert and oriented to person, place, and time. She has normal reflexes. No cranial nerve deficit.  Skin: Skin is warm and dry. No rash noted. No erythema. No pallor.  Psychiatric: She has a normal mood and affect. Her behavior is normal. Judgment and thought content normal.   BP 146/65  Pulse 71  Temp(Src) 97.5 F (36.4 C) (Oral)  Ht _0  (1.575 m)  Wt 144 lb (65.318 kg)  BMI 26.33 kg/m2        Assessment & Plan:  1. Arthritis - POCT CBC  2. Elevated blood sugar - POCT CBC  3. Vitamin D deficiency - POCT CBC - Vit D  25 hydroxy (rtn osteoporosis monitoring)  4. Essential hypertension - POCT CBC - BMP8+EGFR - Hepatic function panel  5. Hyperlipemia - POCT CBC - Lipid panel  6. Visual impairment  7. Primary osteoarthritis of both knees   Patient Instructions                       Medicare Annual Wellness Visit  [facility] and the medical providers at [facility] strive to bring you the best medical care.  In doing so we not only want to address your current medical conditions and concerns but also to detect new conditions early and prevent illness, disease and health-related problems.    Medicare offers a yearly Wellness Visit which allows our clinical staff to assess your need for preventative services including immunizations, lifestyle education, counseling to decrease risk of preventable diseases and screening for fall risk and other medical concerns.    This visit is provided free of charge (no copay) for all Medicare recipients. The clinical pharmacists at [facility] have begun to conduct these Wellness Visits which will also include a thorough review of all your medications.    As you primary medical provider recommend that you make an appointment for your Annual Wellness Visit if you have not done so already this year.  You may set up this appointment before you leave today or you may call back ([phone]) and  schedule an appointment.  Please make sure when you call that you mention that you are scheduling your Annual Wellness Visit with the clinical pharmacist so that the appointment may be made for the proper length of time.     Continue current medications. Continue good therapeutic lifestyle changes which include good diet and exercise. Fall precautions discussed with patient. If an FOBT was given today- please return it to our front desk. If you are over 75 years old - you may need Prevnar 47 or the adult Pneumonia vaccine.  Flu Shots will be available at our office starting mid- September. Please call and schedule a FLU CLINIC APPOINTMENT.   We will arrange for you to have an appointment with the orthopedic surgeon regarding your knee pain Continue to be careful and do not put yourself at risk for  falling Move slowly, use cane   Arrie Senate MD   8. Abnormality of gait due to impairment of balance   Arrie Senate MD

## 2014-01-22 NOTE — Patient Instructions (Addendum)
Medicare Annual Wellness Visit  [facility] and the medical providers at [facility] strive to bring you the best medical care.  In doing so we not only want to address your current medical conditions and concerns but also to detect new conditions early and prevent illness, disease and health-related problems.    Medicare offers a yearly Wellness Visit which allows our clinical staff to assess your need for preventative services including immunizations, lifestyle education, counseling to decrease risk of preventable diseases and screening for fall risk and other medical concerns.    This visit is provided free of charge (no copay) for all Medicare recipients. The clinical pharmacists at [facility] have begun to conduct these Wellness Visits which will also include a thorough review of all your medications.    As you primary medical provider recommend that you make an appointment for your Annual Wellness Visit if you have not done so already this year.  You may set up this appointment before you leave today or you may call back ([phone]) and schedule an appointment.  Please make sure when you call that you mention that you are scheduling your Annual Wellness Visit with the clinical pharmacist so that the appointment may be made for the proper length of time.     Continue current medications. Continue good therapeutic lifestyle changes which include good diet and exercise. Fall precautions discussed with patient. If an FOBT was given today- please return it to our front desk. If you are over 61 years old - you may need Prevnar 36 or the adult Pneumonia vaccine.  Flu Shots will be available at our office starting mid- September. Please call and schedule a FLU CLINIC APPOINTMENT.   We will arrange for you to have an appointment with the orthopedic surgeon regarding your knee pain Continue to be careful and do not put yourself at  risk for falling Move slowly, use cane

## 2014-01-22 NOTE — Addendum Note (Signed)
Addended by: Zannie Cove on: 01/22/2014 09:49 AM   Modules accepted: Orders

## 2014-01-23 ENCOUNTER — Telehealth: Payer: Self-pay | Admitting: Family Medicine

## 2014-01-23 LAB — BMP8+EGFR
BUN/Creatinine Ratio: 31 — ABNORMAL HIGH (ref 11–26)
BUN: 21 mg/dL (ref 10–36)
CO2: 25 mmol/L (ref 18–29)
Calcium: 8.9 mg/dL (ref 8.7–10.3)
Chloride: 103 mmol/L (ref 97–108)
Creatinine, Ser: 0.68 mg/dL (ref 0.57–1.00)
GFR calc Af Amer: 85 mL/min/{1.73_m2} (ref >59)
GFR, EST NON AFRICAN AMERICAN: 74 mL/min/{1.73_m2} (ref >59)
Glucose: 117 mg/dL — ABNORMAL HIGH (ref 65–99)
Potassium: 4.5 mmol/L (ref 3.5–5.2)
SODIUM: 142 mmol/L (ref 134–144)

## 2014-01-23 LAB — HEPATIC FUNCTION PANEL
ALT: 14 IU/L (ref 0–32)
AST: 16 IU/L (ref 0–40)
Albumin: 4.1 g/dL (ref 3.2–4.6)
Alkaline Phosphatase: 81 IU/L (ref 39–117)
BILIRUBIN DIRECT: 0.13 mg/dL (ref 0.00–0.40)
TOTAL PROTEIN: 6.3 g/dL (ref 6.0–8.5)
Total Bilirubin: 0.4 mg/dL (ref 0.0–1.2)

## 2014-01-23 LAB — LIPID PANEL
Chol/HDL Ratio: 2.3 ratio units (ref 0.0–4.4)
Cholesterol, Total: 122 mg/dL (ref 100–199)
HDL: 54 mg/dL (ref >39)
LDL Calculated: 44 mg/dL (ref 0–99)
TRIGLYCERIDES: 121 mg/dL (ref 0–149)
VLDL CHOLESTEROL CAL: 24 mg/dL (ref 5–40)

## 2014-01-23 LAB — VITAMIN D 25 HYDROXY (VIT D DEFICIENCY, FRACTURES): Vit D, 25-Hydroxy: 47 ng/mL (ref 30.0–100.0)

## 2014-01-23 NOTE — Telephone Encounter (Signed)
Message copied by Cline Crock on Fri Jan 23, 2014 11:44 AM ------      Message from: Chipper Herb      Created: Fri Jan 23, 2014  7:35 AM       Please call the patient's sister Mrs. Janeice Robinson with the results of this report      The blood sugar is elevated at 117. The creatinine, the most important kidney function test is within normal limits. The electrolytes including potassium are within normal limits      All liver function tests are within normal limit      The cholesterol numbers with traditional lipid testing are all excellent and at goal.----- continue current treatment and as aggressive therapeutic lifestyle changes as possible.      The vitamin D level is good at 47, continue current treatment ------

## 2014-01-26 ENCOUNTER — Ambulatory Visit: Payer: Medicare Other | Admitting: Family Medicine

## 2014-02-02 NOTE — Telephone Encounter (Signed)
Patient aware.

## 2014-02-18 ENCOUNTER — Other Ambulatory Visit: Payer: Self-pay | Admitting: Family Medicine

## 2014-02-19 DIAGNOSIS — M25562 Pain in left knee: Secondary | ICD-10-CM | POA: Diagnosis not present

## 2014-02-19 DIAGNOSIS — M25561 Pain in right knee: Secondary | ICD-10-CM | POA: Diagnosis not present

## 2014-02-19 DIAGNOSIS — M1712 Unilateral primary osteoarthritis, left knee: Secondary | ICD-10-CM | POA: Diagnosis not present

## 2014-02-19 DIAGNOSIS — M1711 Unilateral primary osteoarthritis, right knee: Secondary | ICD-10-CM | POA: Diagnosis not present

## 2014-05-11 ENCOUNTER — Other Ambulatory Visit: Payer: Self-pay | Admitting: Family Medicine

## 2014-05-13 ENCOUNTER — Other Ambulatory Visit: Payer: Self-pay | Admitting: Family Medicine

## 2014-05-18 ENCOUNTER — Encounter: Payer: Self-pay | Admitting: Family Medicine

## 2014-05-18 ENCOUNTER — Ambulatory Visit (INDEPENDENT_AMBULATORY_CARE_PROVIDER_SITE_OTHER): Payer: Medicare Other | Admitting: Family Medicine

## 2014-05-18 VITALS — BP 151/71 | HR 82 | Temp 96.5°F | Ht 62.0 in | Wt 138.0 lb

## 2014-05-18 DIAGNOSIS — N390 Urinary tract infection, site not specified: Secondary | ICD-10-CM

## 2014-05-18 DIAGNOSIS — R5383 Other fatigue: Secondary | ICD-10-CM | POA: Diagnosis not present

## 2014-05-18 DIAGNOSIS — R42 Dizziness and giddiness: Secondary | ICD-10-CM | POA: Diagnosis not present

## 2014-05-18 LAB — POCT CBC
Granulocyte percent: 76.4 %G (ref 37–80)
HCT, POC: 44.1 % (ref 37.7–47.9)
Hemoglobin: 13 g/dL (ref 12.2–16.2)
Lymph, poc: 1.6 (ref 0.6–3.4)
MCH, POC: 28.2 pg (ref 27–31.2)
MCHC: 29.5 g/dL — AB (ref 31.8–35.4)
MCV: 95.7 fL (ref 80–97)
MPV: 7.7 fL (ref 0–99.8)
POC Granulocyte: 6.3 (ref 2–6.9)
POC LYMPH PERCENT: 19.7 %L (ref 10–50)
Platelet Count, POC: 323 10*3/uL (ref 142–424)
RBC: 4.6 M/uL (ref 4.04–5.48)
RDW, POC: 14.1 %
WBC: 8.3 10*3/uL (ref 4.6–10.2)

## 2014-05-18 LAB — POCT URINALYSIS DIPSTICK
Bilirubin, UA: NEGATIVE
Glucose, UA: NEGATIVE
Ketones, UA: NEGATIVE
Nitrite, UA: NEGATIVE
Protein, UA: NEGATIVE
Spec Grav, UA: 1.01
Urobilinogen, UA: NEGATIVE
pH, UA: 5

## 2014-05-18 LAB — POCT UA - MICROSCOPIC ONLY
Casts, Ur, LPF, POC: NEGATIVE
Crystals, Ur, HPF, POC: NEGATIVE
Mucus, UA: NEGATIVE
Yeast, UA: NEGATIVE

## 2014-05-18 MED ORDER — MECLIZINE HCL 12.5 MG PO TABS: 12.5000 mg | ORAL_TABLET | Freq: Three times a day (TID) | ORAL | Status: DC | PRN

## 2014-05-18 MED ORDER — CIPROFLOXACIN HCL 250 MG PO TABS: 250.0000 mg | ORAL_TABLET | Freq: Two times a day (BID) | ORAL | Status: DC

## 2014-05-18 NOTE — Progress Notes (Signed)
Subjective:    Patient ID: Marissa Burns, female    DOB: 01-02-1915, 81 y.o.   MRN: [id number]  HPI C/o dizziness.  She woke up this am and became dizzy.  Review of Systems  Constitutional: Negative for fever.  HENT: Negative for ear pain.   Eyes: Negative for discharge.  Respiratory: Negative for cough.   Cardiovascular: Negative for chest pain.  Gastrointestinal: Negative for abdominal distention.  Endocrine: Negative for polyuria.  Genitourinary: Negative for difficulty urinating.  Musculoskeletal: Negative for gait problem and neck pain.  Skin: Negative for color change and rash.  Neurological: Positive for dizziness. Negative for speech difficulty and headaches.  Psychiatric/Behavioral: Negative for agitation.       Objective:    BP 151/71 mmHg  Pulse 82  Temp(Src) 96.5 F (35.8 C) (Oral)  Ht _0  (1.575 m)  Wt 138 lb (62.596 kg)  BMI 25.23 kg/m2 Physical Exam  Constitutional: She is oriented to person, place, and time. She appears well-developed and well-nourished.  HENT:  Head: Normocephalic and atraumatic.  Mouth/Throat: Oropharynx is clear and moist.  Eyes: Pupils are equal, round, and reactive to light.  Neck: Normal range of motion. Neck supple.  Cardiovascular: Normal rate and regular rhythm.   No murmur heard. Pulmonary/Chest: Effort normal and breath sounds normal.  Abdominal: Soft. Bowel sounds are normal. There is no tenderness.  Neurological: She is alert and oriented to person, place, and time.  Skin: Skin is warm and dry.  Psychiatric: She has a normal mood and affect.   Results for orders placed or performed in visit on 05/18/14  POCT CBC  Result Value Ref Range   WBC 8.3 4.6 - 10.2 K/uL   Lymph, poc 1.6 0.6 - 3.4   POC LYMPH PERCENT 19.7 10 - 50 %L   POC Granulocyte 6.3 2 - 6.9   Granulocyte percent 76.4 37 - 80 %G   RBC 4.6 4.04 - 5.48 M/uL   Hemoglobin 13.0 12.2 - 16.2 g/dL   HCT, POC 44.1 37.7 - 47.9 %   MCV 95.7 80 - 97 fL   MCH, POC 28.2 27 - 31.2 pg   MCHC 29.5 (A) 31.8 - 35.4 g/dL   RDW, POC 14.1 %   Platelet Count, POC 323.0 142 - 424 K/uL   MPV 7.7 0 - 99.8 fL  POCT urinalysis dipstick  Result Value Ref Range   Color, UA GOLD    Clarity, UA CLEAR    Glucose, UA NEG    Bilirubin, UA NEG    Ketones, UA NEG    Spec Grav, UA 1.010    Blood, UA TRACE    pH, UA 5.0    Protein, UA NEG    Urobilinogen, UA negative    Nitrite, UA NEG    Leukocytes, UA large (3+)   POCT UA - Microscopic Only  Result Value Ref Range   WBC, Ur, HPF, POC 10-12    RBC, urine, microscopic 1-3    Bacteria, U Microscopic OCC    Mucus, UA NEG    Epithelial cells, urine per micros FEW    Crystals, Ur, HPF, POC NEG    Casts, Ur, LPF, POC NEG    Yeast, UA NEG          Assessment & Plan:     ICD-9-CM ICD-10-CM   1. Other fatigue 780.79 R53.83 meclizine (ANTIVERT) 12.5 MG tablet     POCT CBC     POCT urinalysis dipstick  POCT UA - Microscopic Only     BMP8+EGFR     ciprofloxacin (CIPRO) 250 MG tablet     Urine culture  2. Dizziness and giddiness 780.4 R42 meclizine (ANTIVERT) 12.5 MG tablet     POCT CBC     POCT urinalysis dipstick     POCT UA - Microscopic Only     BMP8+EGFR     ciprofloxacin (CIPRO) 250 MG tablet     Urine culture  3. Urinary tract infection without hematuria, site unspecified 599.0 N39.0 ciprofloxacin (CIPRO) 250 MG tablet     Urine culture     No Follow-up on file.  Lysbeth Penner FNP

## 2014-05-19 LAB — BMP8+EGFR
BUN/Creatinine Ratio: 30 — ABNORMAL HIGH (ref 11–26)
BUN: 23 mg/dL (ref 10–36)
CO2: 24 mmol/L (ref 18–29)
Calcium: 9.1 mg/dL (ref 8.7–10.3)
Chloride: 102 mmol/L (ref 97–108)
Creatinine, Ser: 0.76 mg/dL (ref 0.57–1.00)
GFR calc Af Amer: 76 mL/min/{1.73_m2} (ref >59)
GFR calc non Af Amer: 66 mL/min/{1.73_m2} (ref >59)
Glucose: 104 mg/dL — ABNORMAL HIGH (ref 65–99)
Potassium: 4.2 mmol/L (ref 3.5–5.2)
Sodium: 143 mmol/L (ref 134–144)

## 2014-05-20 LAB — URINE CULTURE

## 2014-05-23 ENCOUNTER — Other Ambulatory Visit: Payer: Self-pay | Admitting: Family Medicine

## 2014-06-04 ENCOUNTER — Ambulatory Visit (INDEPENDENT_AMBULATORY_CARE_PROVIDER_SITE_OTHER): Payer: Medicare Other | Admitting: Family Medicine

## 2014-06-04 ENCOUNTER — Encounter: Payer: Self-pay | Admitting: Family Medicine

## 2014-06-04 VITALS — BP 179/79 | HR 92 | Temp 96.9°F | Ht 62.0 in | Wt 139.0 lb

## 2014-06-04 DIAGNOSIS — N390 Urinary tract infection, site not specified: Secondary | ICD-10-CM

## 2014-06-04 DIAGNOSIS — H6120 Impacted cerumen, unspecified ear: Secondary | ICD-10-CM

## 2014-06-04 DIAGNOSIS — M25562 Pain in left knee: Secondary | ICD-10-CM | POA: Diagnosis not present

## 2014-06-04 DIAGNOSIS — H918X9 Other specified hearing loss, unspecified ear: Secondary | ICD-10-CM | POA: Diagnosis not present

## 2014-06-04 DIAGNOSIS — R739 Hyperglycemia, unspecified: Secondary | ICD-10-CM

## 2014-06-04 DIAGNOSIS — M25551 Pain in right hip: Secondary | ICD-10-CM

## 2014-06-04 DIAGNOSIS — M25561 Pain in right knee: Secondary | ICD-10-CM | POA: Diagnosis not present

## 2014-06-04 DIAGNOSIS — I10 Essential (primary) hypertension: Secondary | ICD-10-CM | POA: Diagnosis not present

## 2014-06-04 DIAGNOSIS — E785 Hyperlipidemia, unspecified: Secondary | ICD-10-CM

## 2014-06-04 DIAGNOSIS — H6123 Impacted cerumen, bilateral: Secondary | ICD-10-CM

## 2014-06-04 DIAGNOSIS — M159 Polyosteoarthritis, unspecified: Secondary | ICD-10-CM

## 2014-06-04 DIAGNOSIS — E559 Vitamin D deficiency, unspecified: Secondary | ICD-10-CM

## 2014-06-04 DIAGNOSIS — R7309 Other abnormal glucose: Secondary | ICD-10-CM | POA: Diagnosis not present

## 2014-06-04 DIAGNOSIS — M150 Primary generalized (osteo)arthritis: Secondary | ICD-10-CM

## 2014-06-04 LAB — POCT CBC
Granulocyte percent: 79.4 %G (ref 37–80)
HEMATOCRIT: 45.9 % (ref 37.7–47.9)
Hemoglobin: 13.6 g/dL (ref 12.2–16.2)
Lymph, poc: 1.5 (ref 0.6–3.4)
MCH: 28.5 pg (ref 27–31.2)
MCHC: 29.8 g/dL — AB (ref 31.8–35.4)
MCV: 95.7 fL (ref 80–97)
MPV: 7.5 fL (ref 0–99.8)
PLATELET COUNT, POC: 310 10*3/uL (ref 142–424)
POC Granulocyte: 7.3 — AB (ref 2–6.9)
POC LYMPH PERCENT: 16.5 %L (ref 10–50)
RBC: 4.79 M/uL (ref 4.04–5.48)
RDW, POC: 13.4 %
WBC: 9.2 10*3/uL (ref 4.6–10.2)

## 2014-06-04 LAB — POCT URINALYSIS DIPSTICK
Bilirubin, UA: NEGATIVE
Blood, UA: NEGATIVE
Glucose, UA: NEGATIVE
Ketones, UA: NEGATIVE
Nitrite, UA: NEGATIVE
Spec Grav, UA: >=1.03
UROBILINOGEN UA: NEGATIVE
pH, UA: 5

## 2014-06-04 LAB — POCT UA - MICROSCOPIC ONLY
Bacteria, U Microscopic: NEGATIVE
CRYSTALS, UR, HPF, POC: NEGATIVE
Casts, Ur, LPF, POC: NEGATIVE
RBC, urine, microscopic: NEGATIVE
YEAST UA: NEGATIVE

## 2014-06-04 NOTE — Progress Notes (Signed)
Subjective:    Patient ID: Marissa Burns, female    DOB: 1914-06-19, 81 y.o.   MRN: [id number]  HPI Pt here for follow up and management of chronic medical problems which includes hyperlipidemia. She is taking medications regularly. The patient is brought to the visit today by her sister. She continues to have problems with her knees and joint problems. She is specifically complaining of some pain in the right hip now. She has several skin lesions that she is worried about she has no complaints with chest pain shortness of breath GI symptoms other than constipation or voiding symptoms. She is still wearing a hearing aid in the left ear and still has problems with her hearing.        Patient Active Problem List   Diagnosis Date Noted  . Visual impairment 01/22/2014  . Chronic constipation 10/08/2013  . Primary osteoarthritis of both knees 10/08/2013  . Hearing deficit 05/20/2013  . Hypertension 10/30/2012  . Hyperlipemia 10/30/2012  . Vitamin D deficiency 10/30/2012  . Arthritis 10/30/2012  . Colon polyp   . Diverticulosis   . Osteoarthritis   . Edema   . Osteoporosis   . Elevated blood sugar   . Cataract    Outpatient Encounter Prescriptions as of 06/04/2014  Medication Sig  . acetaminophen (TYLENOL) 500 MG tablet Take 1,000 mg by mouth every morning.   Marland Kitchen amLODipine (NORVASC) 5 MG tablet TAKE ONE TABLET BY MOUTH ONE TIME DAILY  . aspirin 81 MG EC tablet Take 81 mg by mouth every morning.   Marland Kitchen atorvastatin (LIPITOR) 80 MG tablet TAKE ONE TABLET BY MOUTH ONE TIME DAILY  . benazepril (LOTENSIN) 20 MG tablet TAKE 1 TABLET (20 MG TOTAL) BY MOUTH DAILY.  . busPIRone (BUSPAR) 5 MG tablet TAKE ONE TABLET BY MOUTH TWICE DAILY FOR ANXIETY  . calcium carbonate (TUMS - DOSED IN MG ELEMENTAL CALCIUM) 500 MG chewable tablet Chew 1 tablet by mouth daily.    . Cholecalciferol (VITAMIN D3) 2000 UNITS TABS Take 1 tablet by mouth daily after breakfast.   . furosemide (LASIX) 20 MG tablet  TAKE ONE TABLET BY MOUTH ONE TIME DAILY  . Linaclotide (LINZESS) 145 MCG CAPS capsule Take 1 capsule (145 mcg total) by mouth daily.  . meclizine (ANTIVERT) 12.5 MG tablet Take 1 tablet (12.5 mg total) by mouth 3 (three) times daily as needed for dizziness.  . [DISCONTINUED] ciprofloxacin (CIPRO) 250 MG tablet Take 1 tablet (250 mg total) by mouth 2 (two) times daily.    Review of Systems  Constitutional: Negative.   HENT: Negative.   Eyes: Negative.   Respiratory: Negative.   Cardiovascular: Negative.   Gastrointestinal: Negative.   Endocrine: Negative.   Genitourinary: Negative.   Musculoskeletal: Negative.   Skin:       Several skin lesions  Allergic/Immunologic: Negative.   Neurological: Negative.   Hematological: Negative.   Psychiatric/Behavioral: Negative.        Objective:   Physical Exam  Constitutional: She is oriented to person, place, and time. She appears well-developed and well-nourished. No distress.  The patient is pleasant and alert and remains somewhat hard of hearing but doing incredibly well for 81 years of age.  HENT:  Head: Normocephalic and atraumatic.  Nose: Nose normal.  Mouth/Throat: Oropharynx is clear and moist.  Bilateral ears cerumen, this will be irrigated today to help remove some of the cerumen.  Eyes: Conjunctivae and EOM are normal. Pupils are equal, round, and reactive to light. Right eye  exhibits no discharge. Left eye exhibits no discharge. No scleral icterus.  Neck: Normal range of motion. Neck supple. No thyromegaly present.  No carotid bruits or anterior cervical adenopathy  Cardiovascular: Normal rate, regular rhythm, normal heart sounds and intact distal pulses.   No murmur heard. The heart is regular at 72/m  Pulmonary/Chest: Effort normal and breath sounds normal. No respiratory distress. She has no wheezes. She has no rales. She exhibits no tenderness.  Clear anteriorly and posteriorly  Abdominal: Soft. Bowel sounds are normal.  She exhibits no mass. There is no tenderness. There is no rebound and no guarding.  No epigastric or suprapubic tenderness  Musculoskeletal: She exhibits no edema or tenderness.  Range of motion is hesitant secondary to osteoarthritis of knees and hips.  Lymphadenopathy:    She has no cervical adenopathy.  Neurological: She is alert and oriented to person, place, and time. She has normal reflexes. No cranial nerve deficit.  Skin: Skin is warm and dry. No rash noted.  She has several long nails that need to be clipped.  Psychiatric: She has a normal mood and affect. Her behavior is normal. Judgment and thought content normal.  Nursing note and vitals reviewed.  BP 179/79 mmHg  Pulse 92  Temp(Src) 96.9 F (36.1 C) (Oral)  Ht $R'5\' 2"'qA$  (1.575 m)  Wt 139 lb (63.05 kg)  BMI 25.42 kg/m2  Repeat blood pressure 160/70 right arm sitting regular cuff      Assessment & Plan:  1. Vitamin D deficiency -Continue current vitamin D pending results of lab work - POCT CBC - Vit D  25 hydroxy (rtn osteoporosis monitoring)  2. Essential hypertension -No change in treatment - POCT CBC - BMP8+EGFR - Hepatic function panel  3. Hyperlipemia -Recent cholesterol numbers have been good and she should continue with current treatment pending lab work to be done today. - POCT CBC - Lipid panel  4. Elevated blood sugar -Continue with watching diet and getting as much exercise as possible - POCT CBC - BMP8+EGFR  5. Primary osteoarthritis involving multiple joints -We will arrange for her to see the orthopedic surgeon for possible further injections.  6. Right hip pain -Orthopedic for injections - Ambulatory referral to Orthopedic Surgery  7. Bilateral knee pain -Orthopedic for further injections - Ambulatory referral to Orthopedic Surgery  8. Urinary tract infection without hematuria, site unspecified -She is doing better with these symptoms and we'll have her repeat urine today. - POCT UA -  Microscopic Only - POCT urinalysis dipstick - Urine culture  9. Hearing loss of both ears due to cerumen impaction -Ear irrigation today  No orders of the defined types were placed in this encounter.   Patient Instructions                       Medicare Annual Wellness Visit  [facility] and the medical providers at [facility] strive to bring you the best medical care.  In doing so we not only want to address your current medical conditions and concerns but also to detect new conditions early and prevent illness, disease and health-related problems.    Medicare offers a yearly Wellness Visit which allows our clinical staff to assess your need for preventative services including immunizations, lifestyle education, counseling to decrease risk of preventable diseases and screening for fall risk and other medical concerns.    This visit is provided free of charge (no copay) for all Medicare recipients. The clinical  pharmacists at [facility] have begun to conduct these Wellness Visits which will also include a thorough review of all your medications.    As you primary medical provider recommend that you make an appointment for your Annual Wellness Visit if you have not done so already this year.  You may set up this appointment before you leave today or you may call back ([phone]) and schedule an appointment.  Please make sure when you call that you mention that you are scheduling your Annual Wellness Visit with the clinical pharmacist so that the appointment may be made for the proper length of time.     Continue current medications. Continue good therapeutic lifestyle changes which include good diet and exercise. Fall precautions discussed with patient. If an FOBT was given today- please return it to our front desk. If you are over 19 years old - you may need Prevnar 21 or the adult Pneumonia vaccine.  Flu Shots are still available at our  office. If you still haven't had one please call to set up a nurse visit to get one.   After your visit with [place] today you will receive a survey in the mail or online from [employer] regarding your care with [place]. Please take a moment to fill this out. Your feedback is very important to [place] as you can help [place] better understand your patient needs as well as improve your experience and satisfaction. WE CARE ABOUT YOU!!!   She must continued to be careful with walking and use her cane regularly. She needs to have a visit with the podiatrist for trimming her nails. She also needs a referral and we will arrange this for the orthopedist to look at her knees again with possible injections and her right hip.     Arrie Senate MD

## 2014-06-04 NOTE — Patient Instructions (Addendum)
Medicare Annual Wellness Visit  [facility] and the medical providers at [facility] strive to bring you the best medical care.  In doing so we not only want to address your current medical conditions and concerns but also to detect new conditions early and prevent illness, disease and health-related problems.    Medicare offers a yearly Wellness Visit which allows our clinical staff to assess your need for preventative services including immunizations, lifestyle education, counseling to decrease risk of preventable diseases and screening for fall risk and other medical concerns.    This visit is provided free of charge (no copay) for all Medicare recipients. The clinical pharmacists at [facility] have begun to conduct these Wellness Visits which will also include a thorough review of all your medications.    As you primary medical provider recommend that you make an appointment for your Annual Wellness Visit if you have not done so already this year.  You may set up this appointment before you leave today or you may call back ([phone]) and schedule an appointment.  Please make sure when you call that you mention that you are scheduling your Annual Wellness Visit with the clinical pharmacist so that the appointment may be made for the proper length of time.     Continue current medications. Continue good therapeutic lifestyle changes which include good diet and exercise. Fall precautions discussed with patient. If an FOBT was given today- please return it to our front desk. If you are over 18 years old - you may need Prevnar 24 or the adult Pneumonia vaccine.  Flu Shots are still available at our office. If you still haven't had one please call to set up a nurse visit to get one.   After your visit with [place] today you will receive a survey in the mail or online from [employer] regarding your care with [place]. Please take a moment to  fill this out. Your feedback is very important to [place] as you can help [place] better understand your patient needs as well as improve your experience and satisfaction. WE CARE ABOUT YOU!!!   She must continued to be careful with walking and use her cane regularly. She needs to have a visit with the podiatrist for trimming her nails. She also needs a referral and we will arrange this for the orthopedist to look at her knees again with possible injections and her right hip.

## 2014-06-05 LAB — BMP8+EGFR
BUN / CREAT RATIO: 17 (ref 11–26)
BUN: 12 mg/dL (ref 10–36)
CALCIUM: 9.3 mg/dL (ref 8.7–10.3)
CO2: 25 mmol/L (ref 18–29)
Chloride: 103 mmol/L (ref 97–108)
Creatinine, Ser: 0.71 mg/dL (ref 0.57–1.00)
GFR calc Af Amer: 83 mL/min/{1.73_m2} (ref >59)
GFR calc non Af Amer: 72 mL/min/{1.73_m2} (ref >59)
Glucose: 113 mg/dL — ABNORMAL HIGH (ref 65–99)
POTASSIUM: 4.3 mmol/L (ref 3.5–5.2)
SODIUM: 143 mmol/L (ref 134–144)

## 2014-06-05 LAB — LIPID PANEL
CHOL/HDL RATIO: 2.3 ratio (ref 0.0–4.4)
CHOLESTEROL TOTAL: 139 mg/dL (ref 100–199)
HDL: 60 mg/dL (ref >39)
LDL Calculated: 52 mg/dL (ref 0–99)
TRIGLYCERIDES: 134 mg/dL (ref 0–149)
VLDL Cholesterol Cal: 27 mg/dL (ref 5–40)

## 2014-06-05 LAB — HEPATIC FUNCTION PANEL
ALT: 9 IU/L (ref 0–32)
AST: 15 IU/L (ref 0–40)
Albumin: 3.9 g/dL (ref 3.2–4.6)
Alkaline Phosphatase: 82 IU/L (ref 39–117)
BILIRUBIN TOTAL: 0.4 mg/dL (ref 0.0–1.2)
BILIRUBIN, DIRECT: 0.15 mg/dL (ref 0.00–0.40)
Total Protein: 6.3 g/dL (ref 6.0–8.5)

## 2014-06-05 LAB — VITAMIN D 25 HYDROXY (VIT D DEFICIENCY, FRACTURES): VIT D 25 HYDROXY: 39.7 ng/mL (ref 30.0–100.0)

## 2014-06-06 LAB — URINE CULTURE

## 2014-06-25 DIAGNOSIS — M170 Bilateral primary osteoarthritis of knee: Secondary | ICD-10-CM | POA: Diagnosis not present

## 2014-08-03 ENCOUNTER — Telehealth: Payer: Self-pay | Admitting: Family Medicine

## 2014-08-05 ENCOUNTER — Other Ambulatory Visit: Payer: Self-pay | Admitting: Family Medicine

## 2014-08-07 ENCOUNTER — Other Ambulatory Visit: Payer: Self-pay | Admitting: Family Medicine

## 2014-08-18 DIAGNOSIS — M1711 Unilateral primary osteoarthritis, right knee: Secondary | ICD-10-CM | POA: Diagnosis not present

## 2014-08-18 DIAGNOSIS — M1712 Unilateral primary osteoarthritis, left knee: Secondary | ICD-10-CM | POA: Diagnosis not present

## 2014-08-22 ENCOUNTER — Other Ambulatory Visit: Payer: Self-pay | Admitting: Family Medicine

## 2014-08-25 DIAGNOSIS — M170 Bilateral primary osteoarthritis of knee: Secondary | ICD-10-CM | POA: Diagnosis not present

## 2014-09-01 DIAGNOSIS — M170 Bilateral primary osteoarthritis of knee: Secondary | ICD-10-CM | POA: Diagnosis not present

## 2014-09-09 ENCOUNTER — Other Ambulatory Visit: Payer: Self-pay | Admitting: Family Medicine

## 2014-09-10 ENCOUNTER — Ambulatory Visit (INDEPENDENT_AMBULATORY_CARE_PROVIDER_SITE_OTHER): Payer: Medicare Other | Admitting: Physician Assistant

## 2014-09-10 ENCOUNTER — Encounter: Payer: Self-pay | Admitting: Physician Assistant

## 2014-09-10 VITALS — BP 149/69 | HR 66 | Temp 98.6°F | Ht 62.0 in | Wt 141.4 lb

## 2014-09-10 DIAGNOSIS — S8012XA Contusion of left lower leg, initial encounter: Secondary | ICD-10-CM

## 2014-09-10 NOTE — Progress Notes (Signed)
Subjective:     Patient ID: Marissa Burns, female   DOB: [birth year], 81 y.o.   MRN: [id number]  HPI Pt with bruise to the L lower leg She wants to make sure it is not infected States she hit leg against the door frame No pain or drainage from the site  Review of Systems     Objective:   Physical Exam Large area of ecchy to the mid ant and lateral tibial area No surrounding erythema or induration No TTP Diffuse edema to lower ext bilat No post leg TTP Sensory good distal    Assessment:     Traumatic hematoma of left lower leg, initial encounter      Plan:     Reviewed S/S of infection with pt and caregiver Reviewed nl course with pt F/U prn

## 2014-09-10 NOTE — Patient Instructions (Signed)

## 2014-10-08 ENCOUNTER — Ambulatory Visit (INDEPENDENT_AMBULATORY_CARE_PROVIDER_SITE_OTHER): Payer: Medicare Other | Admitting: Family Medicine

## 2014-10-08 ENCOUNTER — Encounter: Payer: Self-pay | Admitting: Family Medicine

## 2014-10-08 VITALS — BP 151/68 | HR 71 | Temp 97.6°F | Ht 62.0 in | Wt 142.0 lb

## 2014-10-08 DIAGNOSIS — R7309 Other abnormal glucose: Secondary | ICD-10-CM | POA: Diagnosis not present

## 2014-10-08 DIAGNOSIS — I10 Essential (primary) hypertension: Secondary | ICD-10-CM | POA: Diagnosis not present

## 2014-10-08 DIAGNOSIS — E785 Hyperlipidemia, unspecified: Secondary | ICD-10-CM

## 2014-10-08 DIAGNOSIS — M170 Bilateral primary osteoarthritis of knee: Secondary | ICD-10-CM

## 2014-10-08 DIAGNOSIS — L820 Inflamed seborrheic keratosis: Secondary | ICD-10-CM | POA: Diagnosis not present

## 2014-10-08 DIAGNOSIS — R739 Hyperglycemia, unspecified: Secondary | ICD-10-CM

## 2014-10-08 DIAGNOSIS — E559 Vitamin D deficiency, unspecified: Secondary | ICD-10-CM | POA: Diagnosis not present

## 2014-10-08 LAB — POCT CBC
Granulocyte percent: 75.9 %G (ref 37–80)
HEMATOCRIT: 41.6 % (ref 37.7–47.9)
HEMOGLOBIN: 12.9 g/dL (ref 12.2–16.2)
Lymph, poc: 1.5 (ref 0.6–3.4)
MCH: 30 pg (ref 27–31.2)
MCHC: 31.1 g/dL — AB (ref 31.8–35.4)
MCV: 96.5 fL (ref 80–97)
MPV: 7.9 fL (ref 0–99.8)
POC GRANULOCYTE: 5.5 (ref 2–6.9)
POC LYMPH %: 20.8 % (ref 10–50)
Platelet Count, POC: 237 10*3/uL (ref 142–424)
RBC: 4.31 M/uL (ref 4.04–5.48)
RDW, POC: 12.6 %
WBC: 7.2 10*3/uL (ref 4.6–10.2)

## 2014-10-08 NOTE — Progress Notes (Signed)
Subjective:    Patient ID: [name], female    DOB: 07-Jul-1914, 81 y.o.   MRN: [id number]  HPI Pt here for follow up and management of chronic medical problems which includes hypertension and hyperlipidemia. She is taking medications regularly. The patient comes to the visit today with her sister. She recently saw the orthopedist and received an injection in her knee but continues to have problems with the left knee hurting more than the right knee. She is due to return an FOBT and will have routine lab work done today because of her age she refuses Pap smears mammograms and DEXA scan. She is not needing any refills on her medicines today. The patient did have several injections in her knee but she does not think is doing any better. The patient denies chest pain shortness of breath GI symptoms or problems passing her water. She is concerned about some skin lesions which appear to be seborrheic keratoses on her for head and her right wrist.     Patient Active Problem List   Diagnosis Date Noted  . Visual impairment 01/22/2014  . Chronic constipation 10/08/2013  . Primary osteoarthritis of both knees 10/08/2013  . Hearing deficit 05/20/2013  . Hypertension 10/30/2012  . Hyperlipemia 10/30/2012  . Vitamin D deficiency 10/30/2012  . Arthritis 10/30/2012  . Colon polyp   . Diverticulosis   . Osteoarthritis   . Edema   . Osteoporosis   . Elevated blood sugar   . Cataract    Outpatient Encounter Prescriptions as of 10/08/2014  Medication Sig  . acetaminophen (TYLENOL) 500 MG tablet Take 1,000 mg by mouth every morning.   Marland Kitchen amLODipine (NORVASC) 5 MG tablet TAKE ONE TABLET BY MOUTH ONE TIME DAILY  . aspirin 81 MG EC tablet Take 81 mg by mouth every morning.   Marland Kitchen atorvastatin (LIPITOR) 80 MG tablet TAKE ONE TABLET BY MOUTH ONE TIME DAILY  . benazepril (LOTENSIN) 20 MG tablet TAKE ONE TABLET BY MOUTH ONE TIME DAILY  . busPIRone (BUSPAR) 5 MG tablet TAKE ONE TABLET BY MOUTH TWICE  DAILY FOR ANXIETY  . calcium carbonate (TUMS - DOSED IN MG ELEMENTAL CALCIUM) 500 MG chewable tablet Chew 1 tablet by mouth daily.    . Cholecalciferol (VITAMIN D3) 2000 UNITS TABS Take 1 tablet by mouth daily after breakfast.   . furosemide (LASIX) 20 MG tablet TAKE ONE TABLET BY MOUTH ONE  TIME DAILY  . Linaclotide (LINZESS) 145 MCG CAPS capsule Take 1 capsule (145 mcg total) by mouth daily.  . meclizine (ANTIVERT) 12.5 MG tablet Take 1 tablet (12.5 mg total) by mouth 3 (three) times daily as needed for dizziness.   No facility-administered encounter medications on file as of 10/08/2014.      Review of Systems  Constitutional: Negative.   HENT: Negative.   Eyes: Negative.   Respiratory: Negative.   Cardiovascular: Negative.   Gastrointestinal: Negative.   Endocrine: Negative.   Genitourinary: Negative.   Musculoskeletal: Positive for arthralgias (bilateral knee pain - seen ortho, recent injections - left worse than right).  Skin: Negative.   Allergic/Immunologic: Negative.   Neurological: Negative.   Hematological: Negative.   Psychiatric/Behavioral: Negative.        Objective:   Physical Exam  Constitutional: She is oriented to person, place, and time. She appears well-developed and well-nourished. No distress.  Pleasant and alert elderly white female who presents herself and a positive way and appears much younger than her stated age.  HENT:  Head:  Normocephalic and atraumatic.  Right Ear: External ear normal.  Left Ear: External ear normal.  Nose: Nose normal.  Mouth/Throat: Oropharynx is clear and moist.  Eyes: Conjunctivae and EOM are normal. Pupils are equal, round, and reactive to light. Right eye exhibits no discharge. Left eye exhibits no discharge. No scleral icterus.  Neck: Normal range of motion. Neck supple. No thyromegaly present.  No thyromegaly or bruits  Cardiovascular: Normal rate, regular rhythm and normal heart sounds.   No murmur heard. At 72/m    Pulmonary/Chest: Effort normal and breath sounds normal. She has no wheezes. She has no rales.  Abdominal: Soft. Bowel sounds are normal. She exhibits no mass. There is no tenderness. There is no rebound and no guarding.  Musculoskeletal: She exhibits edema. She exhibits no tenderness.  There is some rigidity and tenderness of both knees because of osteoarthritis. There is some pedal edema.  Lymphadenopathy:    She has no cervical adenopathy.  Neurological: She is alert and oriented to person, place, and time.  Skin: Skin is warm and dry. No rash noted. No erythema. No pallor.  There are several irritated areas of skin on the forehead and right wrist area and left cheek area.  Psychiatric: She has a normal mood and affect. Her behavior is normal. Judgment and thought content normal.  Nursing note and vitals reviewed.  BP 176/72 mmHg  Pulse 81  Temp(Src) 97.6 F (36.4 C) (Oral)  Ht _0  (1.575 m)  Wt 142 lb (64.411 kg)  BMI 25.97 kg/m2        Assessment & Plan:  1. Vitamin D deficiency -Continue current vitamin D dose pending results of lab work - POCT CBC - Vit D  25 hydroxy (rtn osteoporosis monitoring)  2. Essential hypertension -Blood pressure is somewhat elevated today but because of her age and that she does not get out of the house for cough will make no changes in her treatment. - POCT CBC - BMP8+EGFR - Hepatic function panel  3. Hyperlipemia -She will continue her current atorvastatin and treatment and lifestyle changes. - POCT CBC - Lipid panel  4. Elevated blood sugar -We will continue to monitor this today but no treatment is planned unless the blood sugars extremely high. - POCT CBC - BMP8+EGFR  5. Seborrheic keratosis, inflamed -We will have her come back to the office to have these seborrheic keratoses of evaluated and treated as well as a good skin check in general.  We informed the patient's sister who will be bringing her back of all the the  planned changes for her to make sure that everything is followed through with.  Patient Instructions                       Medicare Annual Wellness Visit  [facility] and the medical providers at [facility] strive to bring you the best medical care.  In doing so we not only want to address your current medical conditions and concerns but also to detect new conditions early and prevent illness, disease and health-related problems.    Medicare offers a yearly Wellness Visit which allows our clinical staff to assess your need for preventative services including immunizations, lifestyle education, counseling to decrease risk of preventable diseases and screening for fall risk and other medical concerns.    This visit is provided free of charge (no copay) for all Medicare recipients. The clinical pharmacists at [facility] have begun to  conduct these Wellness Visits which will also include a thorough review of all your medications.    As you primary medical provider recommend that you make an appointment for your Annual Wellness Visit if you have not done so already this year.  You may set up this appointment before you leave today or you may call back ([phone]) and schedule an appointment.  Please make sure when you call that you mention that you are scheduling your Annual Wellness Visit with the clinical pharmacist so that the appointment may be made for the proper length of time.     Continue current medications. Continue good therapeutic lifestyle changes which include good diet and exercise. Fall precautions discussed with patient. If an FOBT was given today- please return it to our front desk. If you are over 56 years old - you may need Prevnar 98 or the adult Pneumonia vaccine.  Flu Shots are still available at our office. If you still haven't had one please call to set up a nurse visit to get one.   After your visit with [place] today you will  receive a survey in the mail or online from [employer] regarding your care with [place]. Please take a moment to fill this out. Your feedback is very important to [place] as you can help [place] better understand your patient needs as well as improve your experience and satisfaction. WE CARE ABOUT YOU!!!   The patient should return the FOBT She can take 2 arthritis strength Tylenol tablets every morning and this will be okay it will not be too much Tylenol. She should get her walker out of the storage building and bring it to the house as in case she needs it She should continue to be careful and do not put herself at risk for falling and move slowly She should watch her sodium intake She should try wearing her thigh length support hose to see if this helps control some of the swelling and possibly even some of the knee pain and stiffness that she is having We will make an appointment for her to come back and have the skin lesions looked at on her face and wrist and treated.   Arrie Senate MD

## 2014-10-08 NOTE — Patient Instructions (Addendum)
Medicare Annual Wellness Visit  [facility] and the medical providers at [facility] strive to bring you the best medical care.  In doing so we not only want to address your current medical conditions and concerns but also to detect new conditions early and prevent illness, disease and health-related problems.    Medicare offers a yearly Wellness Visit which allows our clinical staff to assess your need for preventative services including immunizations, lifestyle education, counseling to decrease risk of preventable diseases and screening for fall risk and other medical concerns.    This visit is provided free of charge (no copay) for all Medicare recipients. The clinical pharmacists at [facility] have begun to conduct these Wellness Visits which will also include a thorough review of all your medications.    As you primary medical provider recommend that you make an appointment for your Annual Wellness Visit if you have not done so already this year.  You may set up this appointment before you leave today or you may call back ([phone]) and schedule an appointment.  Please make sure when you call that you mention that you are scheduling your Annual Wellness Visit with the clinical pharmacist so that the appointment may be made for the proper length of time.     Continue current medications. Continue good therapeutic lifestyle changes which include good diet and exercise. Fall precautions discussed with patient. If an FOBT was given today- please return it to our front desk. If you are over 79 years old - you may need Prevnar 26 or the adult Pneumonia vaccine.  Flu Shots are still available at our office. If you still haven't had one please call to set up a nurse visit to get one.   After your visit with [place] today you will receive a survey in the mail or online from [employer] regarding your care with [place]. Please take a moment to  fill this out. Your feedback is very important to [place] as you can help [place] better understand your patient needs as well as improve your experience and satisfaction. WE CARE ABOUT YOU!!!   The patient should return the FOBT She can take 2 arthritis strength Tylenol tablets every morning and this will be okay it will not be too much Tylenol. She should get her walker out of the storage building and bring it to the house as in case she needs it She should continue to be careful and do not put herself at risk for falling and move slowly She should watch her sodium intake She should try wearing her thigh length support hose to see if this helps control some of the swelling and possibly even some of the knee pain and stiffness that she is having We will make an appointment for her to come back and have the skin lesions looked at on her face and wrist and treated.

## 2014-10-09 LAB — BMP8+EGFR
BUN / CREAT RATIO: 19 (ref 11–26)
BUN: 13 mg/dL (ref 10–36)
CHLORIDE: 103 mmol/L (ref 97–108)
CO2: 20 mmol/L (ref 18–29)
Calcium: 8.9 mg/dL (ref 8.7–10.3)
Creatinine, Ser: 0.68 mg/dL (ref 0.57–1.00)
GFR calc Af Amer: 85 mL/min/{1.73_m2} (ref >59)
GFR calc non Af Amer: 74 mL/min/{1.73_m2} (ref >59)
Glucose: 130 mg/dL — ABNORMAL HIGH (ref 65–99)
POTASSIUM: 5 mmol/L (ref 3.5–5.2)
SODIUM: 143 mmol/L (ref 134–144)

## 2014-10-09 LAB — HEPATIC FUNCTION PANEL
ALT: 11 IU/L (ref 0–32)
AST: 20 IU/L (ref 0–40)
Albumin: 3.8 g/dL (ref 3.2–4.6)
Alkaline Phosphatase: 82 IU/L (ref 39–117)
Bilirubin Total: 0.6 mg/dL (ref 0.0–1.2)
Bilirubin, Direct: 0.14 mg/dL (ref 0.00–0.40)
Total Protein: 5.8 g/dL — ABNORMAL LOW (ref 6.0–8.5)

## 2014-10-09 LAB — LIPID PANEL
Chol/HDL Ratio: 2.1 ratio units (ref 0.0–4.4)
Cholesterol, Total: 109 mg/dL (ref 100–199)
HDL: 52 mg/dL (ref >39)
LDL Calculated: 35 mg/dL (ref 0–99)
Triglycerides: 110 mg/dL (ref 0–149)
VLDL Cholesterol Cal: 22 mg/dL (ref 5–40)

## 2014-10-09 LAB — VITAMIN D 25 HYDROXY (VIT D DEFICIENCY, FRACTURES): Vit D, 25-Hydroxy: 37 ng/mL (ref 30.0–100.0)

## 2014-10-14 ENCOUNTER — Other Ambulatory Visit: Payer: Self-pay | Admitting: Family Medicine

## 2014-10-19 ENCOUNTER — Ambulatory Visit (INDEPENDENT_AMBULATORY_CARE_PROVIDER_SITE_OTHER): Payer: Medicare Other | Admitting: Physician Assistant

## 2014-10-19 ENCOUNTER — Encounter: Payer: Self-pay | Admitting: Physician Assistant

## 2014-10-19 VITALS — BP 151/55 | HR 75 | Temp 97.4°F | Ht 62.0 in | Wt 147.0 lb

## 2014-10-19 DIAGNOSIS — L820 Inflamed seborrheic keratosis: Secondary | ICD-10-CM | POA: Diagnosis not present

## 2014-10-19 DIAGNOSIS — Z1212 Encounter for screening for malignant neoplasm of rectum: Secondary | ICD-10-CM

## 2014-10-19 DIAGNOSIS — L720 Epidermal cyst: Secondary | ICD-10-CM | POA: Diagnosis not present

## 2014-10-19 NOTE — Addendum Note (Signed)
Addended by: Selmer Dominion on: 10/19/2014 10:26 AM   Modules accepted: Orders

## 2014-10-19 NOTE — Progress Notes (Signed)
   Subjective:    Patient ID: Marissa Burns, female    DOB: [birth year], 82 y.o.   MRN: [id number]  HPI 81 y/o female presents for lesions on hands and face that are concerning to her. New appearing, darkening. Inflamed and irritated at times.     Review of Systems  Skin:       Enlarging, darkening lesions on face and arms White bump on nose   All other systems reviewed and are negative.      Objective:   Physical Exam  Constitutional: She is oriented to person, place, and time. She appears well-developed and well-nourished. No distress.  Neurological: She is alert and oriented to person, place, and time.  Skin: She is not diaphoretic.  Inflamed seborrheic keratosis on face and arms, irritated by makeup and clothing   Nursing note and vitals reviewed.         Assessment & Plan:  1. Inflamed seborrheic keratosis - treated with cryosurgery x 7  2. Milia - removed via 23 gauge needle   RTO 1 month for recheck   Tiffany A. Benjamin Stain PA-C

## 2014-10-21 LAB — FECAL OCCULT BLOOD, IMMUNOCHEMICAL: Fecal Occult Bld: NEGATIVE

## 2014-10-22 DIAGNOSIS — M1712 Unilateral primary osteoarthritis, left knee: Secondary | ICD-10-CM | POA: Diagnosis not present

## 2014-10-22 DIAGNOSIS — M170 Bilateral primary osteoarthritis of knee: Secondary | ICD-10-CM | POA: Diagnosis not present

## 2014-11-05 ENCOUNTER — Other Ambulatory Visit: Payer: Self-pay | Admitting: Family Medicine

## 2014-12-07 ENCOUNTER — Emergency Department (HOSPITAL_COMMUNITY): Payer: Medicare Other

## 2014-12-07 ENCOUNTER — Emergency Department (HOSPITAL_COMMUNITY)
Admission: EM | Admit: 2014-12-07 | Discharge: 2014-12-07 | Disposition: A | Discharge status: Home / Self Care | Payer: Medicare Other | Attending: Emergency Medicine | Admitting: Emergency Medicine

## 2014-12-07 ENCOUNTER — Encounter (HOSPITAL_COMMUNITY): Payer: Self-pay | Admitting: Emergency Medicine

## 2014-12-07 DIAGNOSIS — Z7982 Long term (current) use of aspirin: Secondary | ICD-10-CM | POA: Insufficient documentation

## 2014-12-07 DIAGNOSIS — Z8719 Personal history of other diseases of the digestive system: Secondary | ICD-10-CM | POA: Diagnosis not present

## 2014-12-07 DIAGNOSIS — I10 Essential (primary) hypertension: Secondary | ICD-10-CM | POA: Insufficient documentation

## 2014-12-07 DIAGNOSIS — Z86010 Personal history of colonic polyps: Secondary | ICD-10-CM | POA: Insufficient documentation

## 2014-12-07 DIAGNOSIS — E785 Hyperlipidemia, unspecified: Secondary | ICD-10-CM | POA: Diagnosis not present

## 2014-12-07 DIAGNOSIS — Z049 Encounter for examination and observation for unspecified reason: Secondary | ICD-10-CM | POA: Diagnosis not present

## 2014-12-07 DIAGNOSIS — Z8669 Personal history of other diseases of the nervous system and sense organs: Secondary | ICD-10-CM | POA: Insufficient documentation

## 2014-12-07 DIAGNOSIS — M1990 Unspecified osteoarthritis, unspecified site: Secondary | ICD-10-CM | POA: Diagnosis not present

## 2014-12-07 DIAGNOSIS — E780 Pure hypercholesterolemia: Secondary | ICD-10-CM | POA: Diagnosis not present

## 2014-12-07 DIAGNOSIS — R42 Dizziness and giddiness: Secondary | ICD-10-CM

## 2014-12-07 DIAGNOSIS — I4891 Unspecified atrial fibrillation: Secondary | ICD-10-CM | POA: Diagnosis not present

## 2014-12-07 DIAGNOSIS — Z79899 Other long term (current) drug therapy: Secondary | ICD-10-CM | POA: Insufficient documentation

## 2014-12-07 LAB — URINALYSIS, ROUTINE W REFLEX MICROSCOPIC
BILIRUBIN URINE: NEGATIVE
Glucose, UA: NEGATIVE mg/dL
Hgb urine dipstick: NEGATIVE
KETONES UR: NEGATIVE mg/dL
LEUKOCYTES UA: NEGATIVE
NITRITE: NEGATIVE
PROTEIN: NEGATIVE mg/dL
Specific Gravity, Urine: <1.005 — ABNORMAL LOW (ref 1.005–1.030)
UROBILINOGEN UA: 0.2 mg/dL (ref 0.0–1.0)
pH: 7 (ref 5.0–8.0)

## 2014-12-07 LAB — CBC WITH DIFFERENTIAL/PLATELET
BASOS ABS: 0 10*3/uL (ref 0.0–0.1)
BASOS PCT: 0 % (ref 0–1)
EOS PCT: 1 % (ref 0–5)
Eosinophils Absolute: 0.1 10*3/uL (ref 0.0–0.7)
HEMATOCRIT: 39.3 % (ref 36.0–46.0)
Hemoglobin: 12.9 g/dL (ref 12.0–15.0)
Lymphocytes Relative: 26 % (ref 12–46)
Lymphs Abs: 1.6 10*3/uL (ref 0.7–4.0)
MCH: 31.9 pg (ref 26.0–34.0)
MCHC: 32.8 g/dL (ref 30.0–36.0)
MCV: 97 fL (ref 78.0–100.0)
MONO ABS: 0.5 10*3/uL (ref 0.1–1.0)
MONOS PCT: 8 % (ref 3–12)
NEUTROS ABS: 4 10*3/uL (ref 1.7–7.7)
Neutrophils Relative %: 65 % (ref 43–77)
PLATELETS: 213 10*3/uL (ref 150–400)
RBC: 4.05 MIL/uL (ref 3.87–5.11)
RDW: 12.5 % (ref 11.5–15.5)
WBC: 6.1 10*3/uL (ref 4.0–10.5)

## 2014-12-07 LAB — COMPREHENSIVE METABOLIC PANEL
ALBUMIN: 3.6 g/dL (ref 3.5–5.0)
ALT: 12 U/L — ABNORMAL LOW (ref 14–54)
ANION GAP: 8 (ref 5–15)
AST: 16 U/L (ref 15–41)
Alkaline Phosphatase: 67 U/L (ref 38–126)
BILIRUBIN TOTAL: 0.6 mg/dL (ref 0.3–1.2)
BUN: 16 mg/dL (ref 6–20)
CHLORIDE: 109 mmol/L (ref 101–111)
CO2: 27 mmol/L (ref 22–32)
Calcium: 8.4 mg/dL — ABNORMAL LOW (ref 8.9–10.3)
Creatinine, Ser: 0.53 mg/dL (ref 0.44–1.00)
GFR calc Af Amer: >60 mL/min (ref >60)
Glucose, Bld: 101 mg/dL — ABNORMAL HIGH (ref 65–99)
POTASSIUM: 3.8 mmol/L (ref 3.5–5.1)
Sodium: 144 mmol/L (ref 135–145)
TOTAL PROTEIN: 6.1 g/dL — AB (ref 6.5–8.1)

## 2014-12-07 LAB — TROPONIN I

## 2014-12-07 LAB — I-STAT CG4 LACTIC ACID, ED: LACTIC ACID, VENOUS: 0.58 mmol/L (ref 0.5–2.0)

## 2014-12-07 MED ORDER — SODIUM CHLORIDE 0.9 % IV BOLUS (SEPSIS)
250.0000 mL | Freq: Once | INTRAVENOUS | Status: AC
  Administered 2014-12-07: 250 mL via INTRAVENOUS

## 2014-12-07 NOTE — ED Provider Notes (Signed)
CSN: [id number]     Arrival date & time 12/07/14  1230 History   This chart was scribed for Marissa Greek, MD by Marissa Burns, ED Scribe. This patient was seen in room APA01/APA01 and the patient's care was started at 12:47 PM. Chief Complaint  Patient presents with  . Dizziness   Patient is a 81 y.o. female presenting with dizziness. The history is provided by the patient. No language interpreter was used.  Dizziness  HPI Comments:  Marissa Burns is a 81 y.o. female who present to the Emergency Department complaining of dizziness. Pt states  she's had 3 episodic dizzy spells this morning.  Her first episode occurred 6 hours ago, when she attempted to get out of bed this morning. States she bent forward to retreive her slippers, but when she stood up felt dizzy, grabbed onto her night stand for assistance and fell backwards onto her bed.Pt reports hx of vertigo. Pt lives alone  Past Medical History  Diagnosis Date  . Essential hypertension, benign   . Colon polyp   . Other and unspecified hyperlipidemia   . Diverticulosis   . Osteoarthritis   . Edema   . Osteoporosis   . Elevated blood sugar   . Cataract   . High cholesterol    Past Surgical History  Procedure Laterality Date  . Cataract extraction    . Appendectomy    . Tonsillectomy     History reviewed. No pertinent family history. Social History  Substance Use Topics  . Smoking status: Never Smoker   . Smokeless tobacco: None  . Alcohol Use: No   OB History    No data available     Review of Systems  Neurological: Positive for dizziness.  All other systems reviewed and are negative.  Allergies  Alendronate sodium; Celebrex; Dilacor; Evista; and Propulsid  Home Medications   Prior to Admission medications   Medication Sig Start Date End Date Taking? Authorizing Provider  acetaminophen (TYLENOL) 500 MG tablet Take 1,000 mg by mouth every morning.    Yes Historical Provider, MD  aspirin 81 MG EC tablet  Take 81 mg by mouth every morning.    Yes Historical Provider, MD  atorvastatin (LIPITOR) 80 MG tablet TAKE ONE TABLET BY MOUTH ONE TIME DAILY 08/24/14  Yes Chipper Herb, MD  calcium carbonate (TUMS - DOSED IN MG ELEMENTAL CALCIUM) 500 MG chewable tablet Chew 1 tablet by mouth daily.     Yes Historical Provider, MD  Cholecalciferol (VITAMIN D3) 2000 UNITS TABS Take 2 tablets by mouth daily after breakfast.    Yes Historical Provider, MD  meclizine (ANTIVERT) 12.5 MG tablet Take 1 tablet (12.5 mg total) by mouth 3 (three) times daily as needed for dizziness. 05/18/14  Yes Lysbeth Penner, FNP  amLODipine (NORVASC) 5 MG tablet TAKE ONE TABLET BY MOUTH ONE TIME DAILY 11/05/14   Chipper Herb, MD  benazepril (LOTENSIN) 20 MG tablet TAKE ONE TABLET BY MOUTH ONE TIME DAILY 08/07/14   Chipper Herb, MD  busPIRone (BUSPAR) 5 MG tablet TAKE ONE TABLET BY MOUTH TWICE DAILY FOR ANXIETY 10/14/14   Chipper Herb, MD  furosemide (LASIX) 20 MG tablet TAKE ONE TABLET BY MOUTH ONE  TIME DAILY 08/06/14   Chipper Herb, MD  Linaclotide [facility]) 145 MCG CAPS capsule Take 1 capsule (145 mcg total) by mouth daily. 05/20/13   Chipper Herb, MD   BP 166/72 mmHg  Pulse 79  Temp(Src) 98 F (36.7  C) (Oral)  Resp 16  Ht 5\' 3"  (1.6 m)  Wt 140 lb (63.504 kg)  BMI 24.81 kg/m2  SpO2 94% Physical Exam  Constitutional: She is oriented to person, place, and time. She appears well-developed and well-nourished. No distress.  HENT:  Head: Normocephalic and atraumatic.  Right Ear: Hearing normal.  Left Ear: Hearing normal.  Nose: Nose normal.  Mouth/Throat: Oropharynx is clear and moist and mucous membranes are normal.  Eyes: Conjunctivae and EOM are normal. Pupils are equal, round, and reactive to light.  Neck: Normal range of motion. Neck supple.  Cardiovascular: Regular rhythm, S1 normal and S2 normal.  Exam reveals no gallop and no friction rub.   No murmur heard. Pulmonary/Chest: Effort normal and breath sounds  normal. No respiratory distress. She exhibits no tenderness.  Abdominal: Soft. Normal appearance and bowel sounds are normal. There is no hepatosplenomegaly. There is no tenderness. There is no rebound, no guarding, no tenderness at McBurney's point and negative Murphy's sign. No hernia.  Musculoskeletal: Normal range of motion.  Neurological: She is alert and oriented to person, place, and time. She has normal strength. No cranial nerve deficit or sensory deficit. Coordination normal. GCS eye subscore is 4. GCS verbal subscore is 5. GCS motor subscore is 6.  Extraocular muscle movement: normal No visual field cut Pupils: equal and reactive both direct and consensual response is normal No nystagmus present    Sensory function is intact to light touch, pinprick Proprioception intact  Grip strength 5/5 symmetric in upper extremities No pronator drift Normal finger to nose bilaterally  Lower extremity strength 5/5 against gravity Normal heel to shin bilaterally  Gait: normal   Skin: Skin is warm, dry and intact. No rash noted. No cyanosis.  Psychiatric: She has a normal mood and affect. Her speech is normal and behavior is normal. Thought content normal.    ED Course  Procedures (including critical care time) DIAGNOSTIC STUDIES: Oxygen Saturation is 94% on RA, normal by my interpretation.    COORDINATION OF CARE: 12:57 PM- Pt advised of plan for treatment and pt agrees.  Labs Review Labs Reviewed  COMPREHENSIVE METABOLIC PANEL - Abnormal; Notable for the following:    Glucose, Bld 101 (*)    Calcium 8.4 (*)    Total Protein 6.1 (*)    ALT 12 (*)    All other components within normal limits  URINALYSIS, ROUTINE W REFLEX MICROSCOPIC (NOT AT [facility]) - Abnormal; Notable for the following:    Specific Gravity, Urine <1.005 (*)    All other components within normal limits  CBC WITH DIFFERENTIAL/PLATELET  TROPONIN I  I-STAT CG4 LACTIC ACID, ED    Imaging Review Ct Head Wo  Contrast  12/07/2014   CLINICAL DATA:  Vertigo, dizziness  EXAM: CT HEAD WITHOUT CONTRAST  TECHNIQUE: Contiguous axial images were obtained from the base of the skull through the vertex without intravenous contrast.  COMPARISON:  None.  FINDINGS: There is no evidence of mass effect, midline shift, or extra-axial fluid collections. There is no evidence of a space-occupying lesion or intracranial hemorrhage. There is no evidence of a cortical-based area of acute infarction. There is an old right subinsular lacunar infarct. There is an old left basal ganglia lacunar infarct. There is generalized cerebral atrophy. There is periventricular white matter low attenuation likely secondary to microangiopathy.  The ventricles and sulci are appropriate for the patient's age. The basal cisterns are patent.  Visualized portions of the orbits are unremarkable. The visualized portions of  the paranasal sinuses and mastoid air cells are unremarkable. Cerebrovascular atherosclerotic calcifications are noted.  The osseous structures are unremarkable.  IMPRESSION: 1. No acute intracranial pathology. 2. Chronic microvascular disease and cerebral atrophy.   Electronically Signed   By: Kathreen Devoid   On: 12/07/2014 14:12   I have personally reviewed and evaluated these images and lab results as part of my medical decision-making.   EKG Interpretation   Date/Time:  Monday December 07 2014 12:39:13 EDT Ventricular Rate:  71 PR Interval:  161 QRS Duration: 91 QT Interval:  403 QTC Calculation: 438 R Axis:   71 Text Interpretation:  Sinus arrhythmia Low voltage, extremity leads  Abnormal R-wave progression, late transition No significant change since  last tracing Confirmed by Ilan Kahrs  MD, Zailey Audia [phone]) on 12/07/2014  12:45:24 PM      MDM   Final diagnoses:  Vertigo   Patient presents to the emergency department for evaluation of dizziness. Patient reports that she had sudden onset of feeling like she was spinning  and falling when she bent over to put her slippers on while getting out of bed. She has had 3 episodes of this dizziness. She did take a meclizine because she does have a history of vertigo. She did not have any immediate improvement, so came to the ER. Patient has a normal neurologic exam. Head CT was normal. She has been given IV fluids and no other interventions but her symptoms have resolved. She is ambulatory without difficulty here in the ER. This consistent with benign positional vertigo, poor for discharge and follow-up with PCP.  I personally performed the services described in this documentation, which was scribed in my presence. The recorded information has been reviewed and is accurate.     Marissa Greek, MD 12/07/14 [phone]

## 2014-12-07 NOTE — Discharge Instructions (Signed)

## 2014-12-07 NOTE — ED Notes (Signed)
describency

## 2014-12-07 NOTE — ED Notes (Signed)
Pt made aware to return if symptoms worsen or if any life threatening symptoms occur.   

## 2014-12-07 NOTE — ED Notes (Signed)
Per EMS: Pt reports dizziness since this morning when she woke up, pt has hx of vertigo.  Pt has had meclizine today with no relief.  Pt reports left shoulder pain yesterday, no pain today.  Epigastric pain since yesterday and into today, pt thinks this is from her constipation. Pt alert and oriented. Pt woke up at 7 this morning.  98 cbg, 94% 139/65 77hr

## 2014-12-10 ENCOUNTER — Encounter: Payer: Self-pay | Admitting: Family Medicine

## 2014-12-10 ENCOUNTER — Ambulatory Visit (INDEPENDENT_AMBULATORY_CARE_PROVIDER_SITE_OTHER): Payer: Medicare Other | Admitting: Family Medicine

## 2014-12-10 VITALS — BP 148/64 | HR 75 | Temp 97.9°F | Ht 63.0 in | Wt 138.0 lb

## 2014-12-10 DIAGNOSIS — H8110 Benign paroxysmal vertigo, unspecified ear: Secondary | ICD-10-CM | POA: Insufficient documentation

## 2014-12-10 NOTE — Patient Instructions (Signed)
If your problem persists you need to do the maneuvers to reposition the loose ear stone- an ear nose and throat doctor can help with this.   Try the meclizine 2 or 3 times a day for the next 2 days   Try the epley maneuvers at home as well.   For constipation Try miralax, 1 capful daily, you may go up to 2 capfulls daily after 3 days or down to 1 cap every other day to get 1-2 normal stools daily

## 2014-12-10 NOTE — Progress Notes (Signed)
   HPI  Patient presents today for ER follow-up for dizziness  Patient explains that for a few days she's had dizziness with room spinning type sensation with looking down. She has a history of vertigo and states that this is similar except that the symptoms are happening looking down instead of looking like it did last time. No fevers, chills, sweats, dyspnea. She has not fallen but states that she feels very uneasy and is using a cane to make sure she doesn't fall.  His tried meclizine one time  Constipation She's tried mural ask one capful daily for 2 days, she has been having bowel movements but feels that she still constant area She wonders if constipation has anything to do with her dizziness No abdominal pain, however she does have discomfort and feels constipated.   PMH: Smoking status noted ROS: Per HPI  Objective: BP 148/64 mmHg  Pulse 75  Temp(Src) 97.9 F (36.6 C) (Oral)  Ht 5\' 3"  (1.6 m)  Wt 138 lb (62.596 kg)  BMI 24.45 kg/m2 Gen: NAD, alert, cooperative with exam HEENT: NCAT, EOMI CV: RRR, good S1/S2, no murmur Resp: CTABL, no wheezes, non-labored Abd: SNTND, BS present, no guarding or organomegaly Ext: No edema, warm Neuro: Alert and oriented, slow careful gait, walks with a cane, cranial nerves II through XII intact, strength 4/5 and symmetric in all 4 extremities, normal sensation all 4 extremities  Assessment and plan:  # Benign positional paroxysmal vertigo Discussed with her at least maneuvers, I feel uneasy about doing a Dix-Hallpike on her in clinic. Recommended meclizine 2 times daily for the next 2 days to see if she has any benefit. Refer to ENT for Epley's maneuvers Discussed at length reasons for return  # Constipation Mild, improving with miralax Discussed titrating miralax   Orders Placed This Encounter  Procedures  . Ambulatory referral to ENT    Referral Priority:  Routine    Referral Type:  Consultation    Referral Reason:   Specialty Services Required    Requested Specialty:  Otolaryngology    Number of Visits Requested:  [place], MD [facility] 12/10/2014, 10:31 AM

## 2014-12-16 ENCOUNTER — Telehealth: Payer: Self-pay | Admitting: Family Medicine

## 2014-12-16 ENCOUNTER — Ambulatory Visit: Payer: Medicare Other | Admitting: Family Medicine

## 2014-12-16 DIAGNOSIS — R42 Dizziness and giddiness: Secondary | ICD-10-CM

## 2014-12-16 DIAGNOSIS — R5383 Other fatigue: Secondary | ICD-10-CM

## 2014-12-16 MED ORDER — MECLIZINE HCL 12.5 MG PO TABS: 12.5000 mg | ORAL_TABLET | Freq: Three times a day (TID) | ORAL | Status: DC | PRN

## 2014-12-16 NOTE — Telephone Encounter (Signed)
done

## 2015-01-10 ENCOUNTER — Other Ambulatory Visit: Payer: Self-pay | Admitting: Family Medicine

## 2015-01-26 ENCOUNTER — Other Ambulatory Visit: Payer: Self-pay | Admitting: Family Medicine

## 2015-01-27 ENCOUNTER — Other Ambulatory Visit: Payer: Self-pay | Admitting: Family Medicine

## 2015-01-27 ENCOUNTER — Ambulatory Visit (INDEPENDENT_AMBULATORY_CARE_PROVIDER_SITE_OTHER): Payer: Medicare Other

## 2015-01-27 DIAGNOSIS — Z23 Encounter for immunization: Secondary | ICD-10-CM | POA: Diagnosis not present

## 2015-02-05 ENCOUNTER — Other Ambulatory Visit: Payer: Self-pay | Admitting: Family Medicine

## 2015-03-03 ENCOUNTER — Encounter: Payer: Self-pay | Admitting: Family Medicine

## 2015-03-03 ENCOUNTER — Ambulatory Visit (INDEPENDENT_AMBULATORY_CARE_PROVIDER_SITE_OTHER): Payer: Medicare Other | Admitting: Family Medicine

## 2015-03-03 VITALS — BP 158/69 | HR 80 | Temp 97.6°F | Ht 63.0 in | Wt 140.0 lb

## 2015-03-03 DIAGNOSIS — M170 Bilateral primary osteoarthritis of knee: Secondary | ICD-10-CM | POA: Diagnosis not present

## 2015-03-03 DIAGNOSIS — I10 Essential (primary) hypertension: Secondary | ICD-10-CM | POA: Diagnosis not present

## 2015-03-03 DIAGNOSIS — E559 Vitamin D deficiency, unspecified: Secondary | ICD-10-CM | POA: Diagnosis not present

## 2015-03-03 DIAGNOSIS — E785 Hyperlipidemia, unspecified: Secondary | ICD-10-CM | POA: Diagnosis not present

## 2015-03-03 NOTE — Patient Instructions (Addendum)
Medicare Annual Wellness Visit  [facility] and the medical providers at [facility] strive to bring you the best medical care.  In doing so we not only want to address your current medical conditions and concerns but also to detect new conditions early and prevent illness, disease and health-related problems.    Medicare offers a yearly Wellness Visit which allows our clinical staff to assess your need for preventative services including immunizations, lifestyle education, counseling to decrease risk of preventable diseases and screening for fall risk and other medical concerns.    This visit is provided free of charge (no copay) for all Medicare recipients. The clinical pharmacists at [facility] have begun to conduct these Wellness Visits which will also include a thorough review of all your medications.    As you primary medical provider recommend that you make an appointment for your Annual Wellness Visit if you have not done so already this year.  You may set up this appointment before you leave today or you may call back [phone] and schedule an appointment.  Please make sure when you call that you mention that you are scheduling your Annual Wellness Visit with the clinical pharmacist so that the appointment may be made for the proper length of time.     Continue current medications. Continue good therapeutic lifestyle changes which include good diet and exercise. Fall precautions discussed with patient. If an FOBT was given today- please return it to our front desk. If you are over 45 years old - you may need Prevnar 6 or the adult Pneumonia vaccine.  **Flu shots are available--- please call and schedule a FLU-CLINIC appointment**  After your visit with [place] today you will receive a survey in the mail or online from [employer] regarding your care with [place]. Please take a moment to fill this out. Your feedback is very  important to [place] as you can help [place] better understand your patient needs as well as improve your experience and satisfaction. WE CARE ABOUT YOU!!!   The patient should continue to be careful and do not put herself at risk for falling. She should use her cane regularly and she should also arise and stand before she starts walking This winter she should drink plenty of fluids She should continue to take her linzess for her constipation She should try the green alcohol with 14 adult aspirin dissolved in at an rub this on her knees and see if it helps If that does not work she should try a salonpas Stay is active as possible

## 2015-03-03 NOTE — Progress Notes (Signed)
Subjective:    Patient ID: Marissa Burns, female    DOB: 1914-11-11, 81 y.o.   MRN: [id number]  HPI Pt here for follow up and management of chronic medical problems which includes hyperlipidemia and hypertension. She is taking medications regularly. The patient comes to the visit today with her sister. Her primary complaint remains the arthritis in both of her knees. She also has trouble hearing. She is declining getting a Pap smear mammogram and DEXA scan. She'll get lab work done today. She is up-to-date on her flu shot and Prevnar vaccine. She does not need any medication refills. The patient denies any chest pain or shortness of breath. She has no trouble swallowing and no heartburn or indigestion. She is passing her water without problems. Her biggest issue once again is the arthralgias in her knees.      Patient Active Problem List   Diagnosis Date Noted  . BPPV (benign paroxysmal positional vertigo) 12/10/2014  . Visual impairment 01/22/2014  . Chronic constipation 10/08/2013  . Primary osteoarthritis of both knees 10/08/2013  . Hearing deficit 05/20/2013  . Hypertension 10/30/2012  . Hyperlipemia 10/30/2012  . Vitamin D deficiency 10/30/2012  . Arthritis 10/30/2012  . Colon polyp   . Diverticulosis   . Osteoarthritis   . Edema   . Osteoporosis   . Elevated blood sugar   . Cataract    Outpatient Encounter Prescriptions as of 03/03/2015  Medication Sig  . acetaminophen (TYLENOL) 500 MG tablet Take 1,000 mg by mouth every morning.   Marland Kitchen amLODipine (NORVASC) 5 MG tablet TAKE ONE TABLET BY MOUTH ONE TIME DAILY  . aspirin 81 MG EC tablet Take 81 mg by mouth every morning.   Marland Kitchen atorvastatin (LIPITOR) 80 MG tablet TAKE ONE TABLET BY MOUTH ONE TIME DAILY  . benazepril (LOTENSIN) 20 MG tablet TAKE ONE TABLET BY MOUTH ONE TIME DAILY  . busPIRone (BUSPAR) 5 MG tablet TAKE ONE TABLET BY MOUTH TWICE DAILY FOR ANXIETY  . calcium carbonate (TUMS - DOSED IN MG ELEMENTAL CALCIUM) 500 MG  chewable tablet Chew 1 tablet by mouth daily.    . Cholecalciferol (VITAMIN D3) 2000 UNITS TABS Take 2 tablets by mouth daily after breakfast.   . furosemide (LASIX) 20 MG tablet TAKE ONE TABLET BY MOUTH ONE  TIME DAILY  . LINZESS 145 MCG CAPS capsule TAKE ONE CAPSULE BY MOUTH ONE TIME DAILY  . meclizine (ANTIVERT) 12.5 MG tablet Take 1 tablet (12.5 mg total) by mouth 3 (three) times daily as needed for dizziness.   No facility-administered encounter medications on file as of 03/03/2015.      Review of Systems  Constitutional: Negative.   HENT: Negative.   Eyes: Negative.   Respiratory: Negative.   Cardiovascular: Negative.   Gastrointestinal: Negative.   Endocrine: Negative.   Genitourinary: Negative.   Musculoskeletal: Positive for arthralgias (knees, right hip and left arm).  Skin: Negative.   Allergic/Immunologic: Negative.   Neurological: Negative.   Hematological: Negative.   Psychiatric/Behavioral: Negative.        Objective:   Physical Exam  Constitutional: She is oriented to person, place, and time. She appears well-developed and well-nourished.  The patient is alert and has trouble hearing but is doing very well for her age and looks much younger than her stated age of 69 years.  HENT:  Head: Normocephalic and atraumatic.  Right Ear: External ear normal.  Left Ear: External ear normal.  Nose: Nose normal.  Mouth/Throat: Oropharynx is clear and moist.  Eyes: Conjunctivae and EOM are normal. Pupils are equal, round, and reactive to light. Right eye exhibits no discharge. Left eye exhibits no discharge. No scleral icterus.  Neck: Normal range of motion. Neck supple. No thyromegaly present.  No masses bruits or adenopathy  Cardiovascular: Normal rate, normal heart sounds and intact distal pulses.   No murmur heard. The heart was irregular at 84/m  Pulmonary/Chest: Effort normal and breath sounds normal. No respiratory distress. She has no wheezes. She has no rales.  She exhibits no tenderness.  Clear anteriorly and posteriorly  Abdominal: Soft. Bowel sounds are normal. She exhibits no mass. There is no tenderness. There is no rebound and no guarding.  Abdomen was soft without masses or tenderness  Musculoskeletal: She exhibits no edema or tenderness.  The patient is still especially with her knees and is tender both joint lines of both knees.  Lymphadenopathy:    She has no cervical adenopathy.  Neurological: She is alert and oriented to person, place, and time. She has normal reflexes. No cranial nerve deficit.  Skin: Skin is warm and dry. No rash noted.  Psychiatric: She has a normal mood and affect. Her behavior is normal. Judgment and thought content normal.  Nursing note and vitals reviewed.  BP 158/69 mmHg  Pulse 80  Temp(Src) 97.6 F (36.4 C) (Oral)  Ht '5\' 3"'  (1.6 m)  Wt 140 lb (63.504 kg)  BMI 24.81 kg/m2        Assessment & Plan:  1. Vitamin D deficiency -Continue current treatment pending results of lab work - CBC with Differential/Platelet - VITAMIN D 25 Hydroxy (Vit-D Deficiency, Fractures)  2. Hyperlipemia -Continue current treatment pending results of lab work - CBC with Differential/Platelet - Lipid panel  3. Essential hypertension -The blood pressure systolic is slightly elevated today but we will make no changes in her treatment - BMP8+EGFR - Hepatic function panel - CBC with Differential/Platelet  4. Primary osteoarthritis of both knees -She will try the green alcohol mixture and apply this to her knees  Patient Instructions                       Medicare Annual Wellness Visit  [facility] and the medical providers at [facility] strive to bring you the best medical care.  In doing so we not only want to address your current medical conditions and concerns but also to detect new conditions early and prevent illness, disease and health-related problems.    Medicare offers a yearly Wellness  Visit which allows our clinical staff to assess your need for preventative services including immunizations, lifestyle education, counseling to decrease risk of preventable diseases and screening for fall risk and other medical concerns.    This visit is provided free of charge (no copay) for all Medicare recipients. The clinical pharmacists at [facility] have begun to conduct these Wellness Visits which will also include a thorough review of all your medications.    As you primary medical provider recommend that you make an appointment for your Annual Wellness Visit if you have not done so already this year.  You may set up this appointment before you leave today or you may call back [phone]) and schedule an appointment.  Please make sure when you call that you mention that you are scheduling your Annual Wellness Visit with the clinical pharmacist so that the appointment may be made for the proper length of time.  Continue current medications. Continue good therapeutic lifestyle changes which include good diet and exercise. Fall precautions discussed with patient. If an FOBT was given today- please return it to our front desk. If you are over 11 years old - you may need Prevnar 46 or the adult Pneumonia vaccine.  **Flu shots are available--- please call and schedule a FLU-CLINIC appointment**  After your visit with [place] today you will receive a survey in the mail or online from [employer] regarding your care with [place]. Please take a moment to fill this out. Your feedback is very important to [place] as you can help [place] better understand your patient needs as well as improve your experience and satisfaction. WE CARE ABOUT YOU!!!   The patient should continue to be careful and do not put herself at risk for falling. She should use her cane regularly and she should also arise and stand before she starts walking This winter she should drink plenty of fluids She should continue to  take her linzess for her constipation She should try the green alcohol with 14 adult aspirin dissolved in at an rub this on her knees and see if it helps If that does not work she should try a salonpas Stay is active as possible   Arrie Senate MD

## 2015-03-04 LAB — CBC WITH DIFFERENTIAL/PLATELET
BASOS ABS: 0 10*3/uL (ref 0.0–0.2)
Basos: 0 %
EOS (ABSOLUTE): 0 10*3/uL (ref 0.0–0.4)
Eos: 1 %
HEMOGLOBIN: 13.6 g/dL (ref 11.1–15.9)
Hematocrit: 40.6 % (ref 34.0–46.6)
IMMATURE GRANS (ABS): 0 10*3/uL (ref 0.0–0.1)
IMMATURE GRANULOCYTES: 0 %
LYMPHS: 25 %
Lymphocytes Absolute: 1.6 10*3/uL (ref 0.7–3.1)
MCH: 31.7 pg (ref 26.6–33.0)
MCHC: 33.5 g/dL (ref 31.5–35.7)
MCV: 95 fL (ref 79–97)
MONOCYTES: 8 %
Monocytes Absolute: 0.5 10*3/uL (ref 0.1–0.9)
NEUTROS PCT: 66 %
Neutrophils Absolute: 4.3 10*3/uL (ref 1.4–7.0)
PLATELETS: 285 10*3/uL (ref 150–379)
RBC: 4.29 x10E6/uL (ref 3.77–5.28)
RDW: 12.9 % (ref 12.3–15.4)
WBC: 6.5 10*3/uL (ref 3.4–10.8)

## 2015-03-04 LAB — BMP8+EGFR
BUN/Creatinine Ratio: 23 (ref 11–26)
BUN: 15 mg/dL (ref 10–36)
CALCIUM: 9.2 mg/dL (ref 8.7–10.3)
CHLORIDE: 101 mmol/L (ref 97–106)
CO2: 26 mmol/L (ref 18–29)
Creatinine, Ser: 0.65 mg/dL (ref 0.57–1.00)
GFR, EST AFRICAN AMERICAN: 86 mL/min/{1.73_m2} (ref >59)
GFR, EST NON AFRICAN AMERICAN: 75 mL/min/{1.73_m2} (ref >59)
Glucose: 109 mg/dL — ABNORMAL HIGH (ref 65–99)
POTASSIUM: 4.9 mmol/L (ref 3.5–5.2)
SODIUM: 143 mmol/L (ref 136–144)

## 2015-03-04 LAB — HEPATIC FUNCTION PANEL
ALBUMIN: 4.2 g/dL (ref 3.2–4.6)
ALT: 11 IU/L (ref 0–32)
AST: 15 IU/L (ref 0–40)
Alkaline Phosphatase: 87 IU/L (ref 39–117)
Bilirubin Total: 0.6 mg/dL (ref 0.0–1.2)
Bilirubin, Direct: 0.24 mg/dL (ref 0.00–0.40)
Total Protein: 6.2 g/dL (ref 6.0–8.5)

## 2015-03-04 LAB — LIPID PANEL
CHOLESTEROL TOTAL: 130 mg/dL (ref 100–199)
Chol/HDL Ratio: 2.2 ratio units (ref 0.0–4.4)
HDL: 58 mg/dL (ref >39)
LDL Calculated: 52 mg/dL (ref 0–99)
TRIGLYCERIDES: 101 mg/dL (ref 0–149)
VLDL CHOLESTEROL CAL: 20 mg/dL (ref 5–40)

## 2015-03-04 LAB — VITAMIN D 25 HYDROXY (VIT D DEFICIENCY, FRACTURES): Vit D, 25-Hydroxy: 38.3 ng/mL (ref 30.0–100.0)

## 2015-03-16 ENCOUNTER — Other Ambulatory Visit: Payer: Self-pay | Admitting: Family Medicine

## 2015-05-03 ENCOUNTER — Other Ambulatory Visit: Payer: Self-pay | Admitting: Family Medicine

## 2015-05-05 ENCOUNTER — Other Ambulatory Visit: Payer: Self-pay | Admitting: Family Medicine

## 2015-05-13 ENCOUNTER — Other Ambulatory Visit: Payer: Self-pay | Admitting: Family Medicine

## 2015-06-30 ENCOUNTER — Other Ambulatory Visit: Payer: Self-pay | Admitting: *Deleted

## 2015-06-30 MED ORDER — ATORVASTATIN CALCIUM 80 MG PO TABS: 80.0000 mg | ORAL_TABLET | Freq: Every day | ORAL | Status: DC

## 2015-06-30 MED ORDER — BENAZEPRIL HCL 20 MG PO TABS: ORAL_TABLET | ORAL | Status: DC

## 2015-06-30 MED ORDER — AMLODIPINE BESYLATE 5 MG PO TABS: 5.0000 mg | ORAL_TABLET | Freq: Every day | ORAL | Status: DC

## 2015-06-30 MED ORDER — FUROSEMIDE 20 MG PO TABS: ORAL_TABLET | ORAL | Status: DC

## 2015-06-30 MED ORDER — BUSPIRONE HCL 5 MG PO TABS: ORAL_TABLET | ORAL | Status: DC

## 2015-06-30 MED ORDER — LINACLOTIDE 145 MCG PO CAPS: 145.0000 ug | ORAL_CAPSULE | Freq: Every day | ORAL | Status: DC

## 2015-07-28 ENCOUNTER — Ambulatory Visit (INDEPENDENT_AMBULATORY_CARE_PROVIDER_SITE_OTHER): Payer: Medicare Other | Admitting: Family Medicine

## 2015-07-28 ENCOUNTER — Encounter: Payer: Self-pay | Admitting: Family Medicine

## 2015-07-28 VITALS — BP 139/68 | HR 75 | Temp 96.8°F | Ht 63.0 in | Wt 133.0 lb

## 2015-07-28 DIAGNOSIS — E559 Vitamin D deficiency, unspecified: Secondary | ICD-10-CM | POA: Diagnosis not present

## 2015-07-28 DIAGNOSIS — M170 Bilateral primary osteoarthritis of knee: Secondary | ICD-10-CM

## 2015-07-28 DIAGNOSIS — M25561 Pain in right knee: Secondary | ICD-10-CM

## 2015-07-28 DIAGNOSIS — M150 Primary generalized (osteo)arthritis: Secondary | ICD-10-CM | POA: Diagnosis not present

## 2015-07-28 DIAGNOSIS — H6120 Impacted cerumen, unspecified ear: Secondary | ICD-10-CM

## 2015-07-28 DIAGNOSIS — H918X9 Other specified hearing loss, unspecified ear: Secondary | ICD-10-CM | POA: Diagnosis not present

## 2015-07-28 DIAGNOSIS — I10 Essential (primary) hypertension: Secondary | ICD-10-CM | POA: Diagnosis not present

## 2015-07-28 DIAGNOSIS — M159 Polyosteoarthritis, unspecified: Secondary | ICD-10-CM

## 2015-07-28 DIAGNOSIS — E785 Hyperlipidemia, unspecified: Secondary | ICD-10-CM

## 2015-07-28 DIAGNOSIS — M25562 Pain in left knee: Secondary | ICD-10-CM | POA: Diagnosis not present

## 2015-07-28 DIAGNOSIS — H6123 Impacted cerumen, bilateral: Secondary | ICD-10-CM

## 2015-07-28 NOTE — Addendum Note (Signed)
Addended by: Zannie Cove on: 07/28/2015 10:26 AM   Modules accepted: Orders

## 2015-07-28 NOTE — Progress Notes (Signed)
Subjective:    Patient ID: Marissa Burns, female    DOB: [birth year], 82 y.o.   MRN: [id number]  HPI Pt here for follow up and management of chronic medical problems which includes hypertension and hyperlipidemia. She is taking medications regularly.The patient complains of some nasal congestion. She also recently had a fall has been aching more since the fall. She has her ongoing chronic knee pain and has seen orthopedic specialist on multiple occasions. Since the fall she has had increased low back pain. The nasal congestion is worse at nighttime. She will get lab work today and does not need any refills. The patient is pleasant and alert. She is going to have her 89th birthday in December. She continues to complain of both knees with the left knee hurting worse than the right knee. We will set her up an appointment to see the orthopedic surgeon at his next visit here. She denies any chest pain or shortness of breath. She wears one hearing aid in the left ear and has had problems with ears cerumen in the past. She says her GI tract is working well with no problems and she continues to take Linzess and this helps her. She has lost some weight but has no other symptoms associated with this and we will continue to monitor this. She is passing her water without problems.    Patient Active Problem List   Diagnosis Date Noted  . BPPV (benign paroxysmal positional vertigo) 12/10/2014  . Visual impairment 01/22/2014  . Chronic constipation 10/08/2013  . Primary osteoarthritis of both knees 10/08/2013  . Hearing deficit 05/20/2013  . Hypertension 10/30/2012  . Hyperlipemia 10/30/2012  . Vitamin D deficiency 10/30/2012  . Arthritis 10/30/2012  . Colon polyp   . Diverticulosis   . Osteoarthritis   . Edema   . Osteoporosis   . Elevated blood sugar   . Cataract    Outpatient Encounter Prescriptions as of 07/28/2015  Medication Sig  . acetaminophen (TYLENOL) 500 MG tablet Take 1,000 mg by mouth  every morning.   Marland Kitchen amLODipine (NORVASC) 5 MG tablet Take 1 tablet (5 mg total) by mouth daily.  Marland Kitchen aspirin 81 MG EC tablet Take 81 mg by mouth every morning.   Marland Kitchen atorvastatin (LIPITOR) 80 MG tablet Take 1 tablet (80 mg total) by mouth daily.  . benazepril (LOTENSIN) 20 MG tablet TAKE ONE TABLET BY MOUTH ONE  TIME DAILY  . busPIRone (BUSPAR) 5 MG tablet TAKE ONE TABLET BY MOUTH TWICE DAILY FOR ANXIETY  . calcium carbonate (TUMS - DOSED IN MG ELEMENTAL CALCIUM) 500 MG chewable tablet Chew 1 tablet by mouth daily.    . Cholecalciferol (VITAMIN D3) 2000 UNITS TABS Take 2 tablets by mouth daily after breakfast.   . furosemide (LASIX) 20 MG tablet TAKE ONE TABLET BY MOUTH ONE  TIME DAILY  . Linaclotide (LINZESS) 145 MCG CAPS capsule Take 1 capsule (145 mcg total) by mouth daily.  . meclizine (ANTIVERT) 12.5 MG tablet Take 1 tablet (12.5 mg total) by mouth 3 (three) times daily as needed for dizziness.   No facility-administered encounter medications on file as of 07/28/2015.      Review of Systems  Constitutional: Negative.   HENT: Positive for congestion (nasal - worse at night).   Eyes: Negative.   Respiratory: Negative.   Cardiovascular: Negative.   Gastrointestinal: Negative.   Endocrine: Negative.   Genitourinary: Negative.   Musculoskeletal: Positive for myalgias (from fall last week), back pain (lower - from  fall ) and arthralgias (on-going bilateral knee pain).  Skin: Negative.   Allergic/Immunologic: Negative.   Neurological: Negative.   Hematological: Negative.   Psychiatric/Behavioral: Negative.        Objective:   Physical Exam  Constitutional: She is oriented to person, place, and time. She appears well-developed and well-nourished. No distress.  The patient is pleasant and alert with diminished hearing but is doing amazingly well for her age of 18 years.  HENT:  Head: Normocephalic and atraumatic.  Nose: Nose normal.  Mouth/Throat: Oropharynx is clear and moist. No  oropharyngeal exudate.  Bilateral ears cerumen and some nasal congestion. She wears a hearing aid in the left ear.  Eyes: Conjunctivae and EOM are normal. Pupils are equal, round, and reactive to light. Right eye exhibits no discharge. Left eye exhibits no discharge. No scleral icterus.  Neck: Normal range of motion. Neck supple. No thyromegaly present.  No bruits thyromegaly or adenopathy  Cardiovascular: Normal rate, normal heart sounds and intact distal pulses.   No murmur heard. The heart is 60-72/m and slightly irregular. She has amazingly good pedal pulses bilaterally  Pulmonary/Chest: Effort normal and breath sounds normal. No respiratory distress. She has no wheezes. She has no rales. She exhibits no tenderness.  Clear anteriorly and posteriorly  Abdominal: Soft. Bowel sounds are normal. She exhibits no mass. There is no tenderness. There is no rebound and no guarding.  The patient was examined in a sitting position as she preferred not to get on the exam table. There were no masses palpable or organ enlargement palpable.  Musculoskeletal: She exhibits no edema or tenderness.  Patient has swelling in both knees and she is tender at the joint lines of both knees.  Lymphadenopathy:    She has no cervical adenopathy.  Neurological: She is alert and oriented to person, place, and time. No cranial nerve deficit.  Skin: Skin is warm and dry. No rash noted.  She has a bruise which is healing on the left upper arm from her recent fall.  Psychiatric: She has a normal mood and affect. Her behavior is normal. Judgment and thought content normal.  Nursing note and vitals reviewed.  BP 139/68 mmHg  Pulse 75  Temp(Src) 96.8 F (36 C) (Oral)  Ht _0  (1.6 m)  Wt 133 lb (60.328 kg)  BMI 23.57 kg/m2        Assessment & Plan:  1. Vitamin D deficiency -Continue current treatment pending results of lab work - CBC with Differential/Platelet - VITAMIN D 25 Hydroxy (Vit-D Deficiency,  Fractures)  2. Essential hypertension -The blood pressure is good today and she will continue with current treatment - BMP8+EGFR - CBC with Differential/Platelet - Hepatic function panel  3. Hyperlipemia -Continue with atorvastatin and aggressive therapeutic lifestyle changes as possible pending results of lab work - CBC with Differential/Platelet - Lipid panel  4. Primary osteoarthritis of both knees -We will reschedule her for a visit with the orthopedic surgeon at his next visit or injections as she says this does help her knees somewhat for a period of time.  5. Primary osteoarthritis involving multiple joints -Continue to stay as active as possible  6. Hearing loss of both ears due to cerumen impaction -Ear irrigation will be done in the office today.  Patient Instructions                       Medicare Annual Wellness Visit  [facility] and the medical providers at [facility] strive to bring you the best medical care.  In doing so we not only want to address your current medical conditions and concerns but also to detect new conditions early and prevent illness, disease and health-related problems.    Medicare offers a yearly Wellness Visit which allows our clinical staff to assess your need for preventative services including immunizations, lifestyle education, counseling to decrease risk of preventable diseases and screening for fall risk and other medical concerns.    This visit is provided free of charge (no copay) for all Medicare recipients. The clinical pharmacists at [facility] have begun to conduct these Wellness Visits which will also include a thorough review of all your medications.    As you primary medical provider recommend that you make an appointment for your Annual Wellness Visit if you have not done so already this year.  You may set up this appointment before you leave today or you may call back ([phone]) and  schedule an appointment.  Please make sure when you call that you mention that you are scheduling your Annual Wellness Visit with the clinical pharmacist so that the appointment may be made for the proper length of time.     Continue current medications. Continue good therapeutic lifestyle changes which include good diet and exercise. Fall precautions discussed with patient. If an FOBT was given today- please return it to our front desk. If you are over 35 years old - you may need Prevnar 35 or the adult Pneumonia vaccine.  **Flu shots are available--- please call and schedule a FLU-CLINIC appointment**  After your visit with [place] today you will receive a survey in the mail or online from [employer] regarding your care with [place]. Please take a moment to fill this out. Your feedback is very important to [place] as you can help [place] better understand your patient needs as well as improve your experience and satisfaction. WE CARE ABOUT YOU!!!   The patient will continue to make every effort to use her cane and walker at home to prevent and keep herself from falling. We will do ear irrigations today to remove some of the cerumen We will schedule her for a visit with the orthopedic surgeon that comes to this office that she has usually been seeing when he comes at the next visit.   Arrie Senate MD

## 2015-07-28 NOTE — Patient Instructions (Addendum)
Medicare Annual Wellness Visit  [facility] and the medical providers at [facility] strive to bring you the best medical care.  In doing so we not only want to address your current medical conditions and concerns but also to detect new conditions early and prevent illness, disease and health-related problems.    Medicare offers a yearly Wellness Visit which allows our clinical staff to assess your need for preventative services including immunizations, lifestyle education, counseling to decrease risk of preventable diseases and screening for fall risk and other medical concerns.    This visit is provided free of charge (no copay) for all Medicare recipients. The clinical pharmacists at [facility] have begun to conduct these Wellness Visits which will also include a thorough review of all your medications.    As you primary medical provider recommend that you make an appointment for your Annual Wellness Visit if you have not done so already this year.  You may set up this appointment before you leave today or you may call back [phone] and schedule an appointment.  Please make sure when you call that you mention that you are scheduling your Annual Wellness Visit with the clinical pharmacist so that the appointment may be made for the proper length of time.     Continue current medications. Continue good therapeutic lifestyle changes which include good diet and exercise. Fall precautions discussed with patient. If an FOBT was given today- please return it to our front desk. If you are over 75 years old - you may need Prevnar 18 or the adult Pneumonia vaccine.  **Flu shots are available--- please call and schedule a FLU-CLINIC appointment**  After your visit with [place] today you will receive a survey in the mail or online from [employer] regarding your care with [place]. Please take a moment to fill this out. Your feedback is very  important to [place] as you can help [place] better understand your patient needs as well as improve your experience and satisfaction. WE CARE ABOUT YOU!!!   The patient will continue to make every effort to use her cane and walker at home to prevent and keep herself from falling. We will do ear irrigations today to remove some of the cerumen We will schedule her for a visit with the orthopedic surgeon that comes to this office that she has usually been seeing when he comes at the next visit.

## 2015-07-29 LAB — BMP8+EGFR
BUN/Creatinine Ratio: 21 (ref 12–28)
BUN: 14 mg/dL (ref 10–36)
CALCIUM: 9.2 mg/dL (ref 8.7–10.3)
CHLORIDE: 101 mmol/L (ref 96–106)
CO2: 26 mmol/L (ref 18–29)
Creatinine, Ser: 0.68 mg/dL (ref 0.57–1.00)
GFR calc Af Amer: 84 mL/min/{1.73_m2} (ref >59)
GFR calc non Af Amer: 73 mL/min/{1.73_m2} (ref >59)
GLUCOSE: 112 mg/dL — AB (ref 65–99)
POTASSIUM: 4.7 mmol/L (ref 3.5–5.2)
SODIUM: 142 mmol/L (ref 134–144)

## 2015-07-29 LAB — HEPATIC FUNCTION PANEL
ALT: 10 IU/L (ref 0–32)
AST: 17 IU/L (ref 0–40)
Albumin: 4 g/dL (ref 3.2–4.6)
Alkaline Phosphatase: 95 IU/L (ref 39–117)
BILIRUBIN TOTAL: 0.6 mg/dL (ref 0.0–1.2)
Bilirubin, Direct: 0.19 mg/dL (ref 0.00–0.40)
TOTAL PROTEIN: 6.3 g/dL (ref 6.0–8.5)

## 2015-07-29 LAB — CBC WITH DIFFERENTIAL/PLATELET
BASOS ABS: 0 10*3/uL (ref 0.0–0.2)
BASOS: 0 %
EOS (ABSOLUTE): 0.1 10*3/uL (ref 0.0–0.4)
Eos: 1 %
Hematocrit: 40.5 % (ref 34.0–46.6)
Hemoglobin: 13.4 g/dL (ref 11.1–15.9)
IMMATURE GRANULOCYTES: 0 %
Immature Grans (Abs): 0 10*3/uL (ref 0.0–0.1)
Lymphocytes Absolute: 1.7 10*3/uL (ref 0.7–3.1)
Lymphs: 23 %
MCH: 31.7 pg (ref 26.6–33.0)
MCHC: 33.1 g/dL (ref 31.5–35.7)
MCV: 96 fL (ref 79–97)
MONOS ABS: 0.6 10*3/uL (ref 0.1–0.9)
Monocytes: 8 %
NEUTROS PCT: 68 %
Neutrophils Absolute: 4.8 10*3/uL (ref 1.4–7.0)
PLATELETS: 294 10*3/uL (ref 150–379)
RBC: 4.23 x10E6/uL (ref 3.77–5.28)
RDW: 12.9 % (ref 12.3–15.4)
WBC: 7.1 10*3/uL (ref 3.4–10.8)

## 2015-07-29 LAB — LIPID PANEL
Chol/HDL Ratio: 2.3 ratio units (ref 0.0–4.4)
Cholesterol, Total: 128 mg/dL (ref 100–199)
HDL: 55 mg/dL (ref >39)
LDL CALC: 48 mg/dL (ref 0–99)
Triglycerides: 127 mg/dL (ref 0–149)
VLDL CHOLESTEROL CAL: 25 mg/dL (ref 5–40)

## 2015-07-29 LAB — VITAMIN D 25 HYDROXY (VIT D DEFICIENCY, FRACTURES): Vit D, 25-Hydroxy: 40.3 ng/mL (ref 30.0–100.0)

## 2015-08-16 ENCOUNTER — Telehealth: Payer: Self-pay | Admitting: Family Medicine

## 2015-08-17 NOTE — Telephone Encounter (Signed)
Looks like patient refused appt but now wants to go. Can this be scheduled?

## 2015-08-26 DIAGNOSIS — M1712 Unilateral primary osteoarthritis, left knee: Secondary | ICD-10-CM | POA: Diagnosis not present

## 2015-08-26 DIAGNOSIS — G8929 Other chronic pain: Secondary | ICD-10-CM | POA: Diagnosis not present

## 2015-08-26 DIAGNOSIS — M25561 Pain in right knee: Secondary | ICD-10-CM | POA: Diagnosis not present

## 2015-08-26 DIAGNOSIS — M170 Bilateral primary osteoarthritis of knee: Secondary | ICD-10-CM | POA: Diagnosis not present

## 2015-08-26 DIAGNOSIS — M25562 Pain in left knee: Secondary | ICD-10-CM | POA: Diagnosis not present

## 2015-08-26 DIAGNOSIS — M1711 Unilateral primary osteoarthritis, right knee: Secondary | ICD-10-CM | POA: Diagnosis not present

## 2015-10-04 DIAGNOSIS — H1013 Acute atopic conjunctivitis, bilateral: Secondary | ICD-10-CM | POA: Diagnosis not present

## 2015-10-04 DIAGNOSIS — H40033 Anatomical narrow angle, bilateral: Secondary | ICD-10-CM | POA: Diagnosis not present

## 2015-11-18 ENCOUNTER — Encounter: Payer: Self-pay | Admitting: *Deleted

## 2015-12-14 ENCOUNTER — Ambulatory Visit (INDEPENDENT_AMBULATORY_CARE_PROVIDER_SITE_OTHER): Payer: Medicare Other | Admitting: Family Medicine

## 2015-12-14 ENCOUNTER — Encounter: Payer: Self-pay | Admitting: Family Medicine

## 2015-12-14 ENCOUNTER — Ambulatory Visit (INDEPENDENT_AMBULATORY_CARE_PROVIDER_SITE_OTHER): Payer: Medicare Other

## 2015-12-14 VITALS — BP 158/68 | HR 81 | Temp 96.9°F | Ht 63.0 in | Wt 136.0 lb

## 2015-12-14 DIAGNOSIS — D692 Other nonthrombocytopenic purpura: Secondary | ICD-10-CM

## 2015-12-14 DIAGNOSIS — M170 Bilateral primary osteoarthritis of knee: Secondary | ICD-10-CM

## 2015-12-14 DIAGNOSIS — E559 Vitamin D deficiency, unspecified: Secondary | ICD-10-CM

## 2015-12-14 DIAGNOSIS — M25562 Pain in left knee: Secondary | ICD-10-CM

## 2015-12-14 DIAGNOSIS — I10 Essential (primary) hypertension: Secondary | ICD-10-CM

## 2015-12-14 DIAGNOSIS — H918X9 Other specified hearing loss, unspecified ear: Secondary | ICD-10-CM | POA: Diagnosis not present

## 2015-12-14 DIAGNOSIS — E785 Hyperlipidemia, unspecified: Secondary | ICD-10-CM

## 2015-12-14 DIAGNOSIS — H6120 Impacted cerumen, unspecified ear: Secondary | ICD-10-CM

## 2015-12-14 DIAGNOSIS — R350 Frequency of micturition: Secondary | ICD-10-CM | POA: Diagnosis not present

## 2015-12-14 DIAGNOSIS — M25561 Pain in right knee: Secondary | ICD-10-CM

## 2015-12-14 DIAGNOSIS — H6123 Impacted cerumen, bilateral: Secondary | ICD-10-CM

## 2015-12-14 NOTE — Patient Instructions (Addendum)
Medicare Annual Wellness Visit  [facility] and the medical providers at [facility] strive to bring you the best medical care.  In doing so we not only want to address your current medical conditions and concerns but also to detect new conditions early and prevent illness, disease and health-related problems.    Medicare offers a yearly Wellness Visit which allows our clinical staff to assess your need for preventative services including immunizations, lifestyle education, counseling to decrease risk of preventable diseases and screening for fall risk and other medical concerns.    This visit is provided free of charge (no copay) for all Medicare recipients. The clinical pharmacists at [facility] have begun to conduct these Wellness Visits which will also include a thorough review of all your medications.    As you primary medical provider recommend that you make an appointment for your Annual Wellness Visit if you have not done so already this year.  You may set up this appointment before you leave today or you may call back [phone] and schedule an appointment.  Please make sure when you call that you mention that you are scheduling your Annual Wellness Visit with the clinical pharmacist so that the appointment may be made for the proper length of time.     Continue current medications. Continue good therapeutic lifestyle changes which include good diet and exercise. Fall precautions discussed with patient. If an FOBT was given today- please return it to our front desk. If you are over 39 years old - you may need Prevnar 76 or the adult Pneumonia vaccine.   After your visit with [place] today you will receive a survey in the mail or online from [employer] regarding your care with [place]. Please take a moment to fill this out. Your feedback is very important to [place] as you can help [place] better understand your patient needs as well as  improve your experience and satisfaction. WE CARE ABOUT YOU!!!   The patient should continue to get as much exercise in her home as possible She should continue to be careful not put herself at risk for falling and move slowly She should drink is much fluid as possible to stay well hydrated She should continue with her medication for constipation We will call her sister with the lab results and chest x-ray results as soon as they become available

## 2015-12-14 NOTE — Progress Notes (Signed)
Subjective:    Patient ID: Marissa Burns, female    DOB: 04-30-1914, 82 y.o.   MRN: [id number]  HPI Pt here for follow up and management of chronic medical problems which includes hyperlipidemia and hypertension. She is taking medications regularly.The patient continues to complain with left knee pain. She is seen the orthopedic surgeon on multiple occasions and basically has end-stage knee disease. She is due to get a chest x-ray today return an FOBT and get lab work. She comes with her sister to the visit. The patient's biggest complaint remains her knees. She has not seen orthopedic surgeon and a wall and we will schedule her for a visit for another injection especially in her left knee. Do systems she denies any chest pain or shortness of breath. She denies any trouble with her stomach other than constipation that has bothered her recently. She admits to not drinking enough fluids. She also does complain of some urinary frequency. She does not get out of the house a lot. She has had a couple of falls and she says this is where she is turning too quickly. She is sickly is confined to the house other than when her sister gets her out and takes her to different places.      Patient Active Problem List   Diagnosis Date Noted  . BPPV (benign paroxysmal positional vertigo) 12/10/2014  . Visual impairment 01/22/2014  . Chronic constipation 10/08/2013  . Primary osteoarthritis of both knees 10/08/2013  . Hearing deficit 05/20/2013  . Hypertension 10/30/2012  . Hyperlipemia 10/30/2012  . Vitamin D deficiency 10/30/2012  . Arthritis 10/30/2012  . Colon polyp   . Diverticulosis   . Osteoarthritis   . Edema   . Osteoporosis   . Elevated blood sugar   . Cataract    Outpatient Encounter Prescriptions as of 12/14/2015  Medication Sig  . acetaminophen (TYLENOL) 500 MG tablet Take 1,000 mg by mouth every morning.   Marland Kitchen amLODipine (NORVASC) 5 MG tablet Take 1 tablet (5 mg total) by mouth daily.    Marland Kitchen aspirin 81 MG EC tablet Take 81 mg by mouth every morning.   Marland Kitchen atorvastatin (LIPITOR) 80 MG tablet Take 1 tablet (80 mg total) by mouth daily.  . benazepril (LOTENSIN) 20 MG tablet TAKE ONE TABLET BY MOUTH ONE  TIME DAILY  . busPIRone (BUSPAR) 5 MG tablet TAKE ONE TABLET BY MOUTH TWICE DAILY FOR ANXIETY  . calcium carbonate (TUMS - DOSED IN MG ELEMENTAL CALCIUM) 500 MG chewable tablet Chew 1 tablet by mouth daily.    . Cholecalciferol (VITAMIN D3) 2000 UNITS TABS Take 2 tablets by mouth daily after breakfast.   . furosemide (LASIX) 20 MG tablet TAKE ONE TABLET BY MOUTH ONE  TIME DAILY  . Linaclotide (LINZESS) 145 MCG CAPS capsule Take 1 capsule (145 mcg total) by mouth daily.  . meclizine (ANTIVERT) 12.5 MG tablet Take 1 tablet (12.5 mg total) by mouth 3 (three) times daily as needed for dizziness.   No facility-administered encounter medications on file as of 12/14/2015.      Review of Systems  Constitutional: Negative.   HENT: Negative.   Eyes: Negative.   Respiratory: Negative.   Cardiovascular: Negative.   Gastrointestinal: Negative.   Endocrine: Negative.   Genitourinary: Negative.   Musculoskeletal: Positive for arthralgias (left knee pain).  Skin: Negative.   Allergic/Immunologic: Negative.   Neurological: Negative.   Hematological: Negative.   Psychiatric/Behavioral: Negative.        Objective:  Physical Exam  Constitutional: She is oriented to person, place, and time. She appears well-developed and well-nourished. No distress.  Pleasant and elderly but considering her age doing very well. She is hearing impaired.  HENT:  Head: Normocephalic and atraumatic.  Right Ear: External ear normal.  Left Ear: External ear normal.  Nose: Nose normal.  Mouth/Throat: Oropharynx is clear and moist.  Eyes: Conjunctivae and EOM are normal. Pupils are equal, round, and reactive to light. Right eye exhibits no discharge. Left eye exhibits no discharge. No scleral icterus.  Neck:  Normal range of motion. Neck supple. No thyromegaly present.  Cardiovascular: Normal rate, regular rhythm and normal heart sounds.   No murmur heard. The heart has a regular rate and rhythm at 72/m  Pulmonary/Chest: Effort normal and breath sounds normal. No respiratory distress. She has no wheezes. She has no rales. She exhibits no tenderness.  Clear anteriorly and posteriorly  Abdominal: Soft. Bowel sounds are normal. She exhibits no mass. There is no tenderness. There is no rebound and no guarding.  No abdominal tenderness no suprapubic tenderness and no organ enlargement palpable  Musculoskeletal: She exhibits no edema.  The patient has bilateral knee tenderness at both joint lines. She is stiff with standing and slow with movement.  Lymphadenopathy:    She has no cervical adenopathy.  Neurological: She is alert and oriented to person, place, and time.  Skin: Skin is warm and dry. No rash noted.  The patient has some bruising on her wrist from the storm door hitting her this morning  Psychiatric: She has a normal mood and affect. Her behavior is normal. Judgment and thought content normal.  Nursing note and vitals reviewed.   BP (!) 168/71 (BP Location: Left Arm)   Pulse 81   Temp (!) 96.9 F (36.1 C) (Oral)   Ht '5\' 3"'  (1.6 m)   Wt 136 lb (61.7 kg)   BMI 24.09 kg/m         Assessment & Plan:  1. Vitamin D deficiency -Continue current treatment pending results of lab work - CBC with Differential/Platelet - VITAMIN D 25 Hydroxy (Vit-D Deficiency, Fractures)  2. Essential hypertension -No change in treatment with repeat blood pressure being 158/68 and the cause of her age of 34 years. - CBC with Differential/Platelet - BMP8+EGFR - Hepatic function panel - DG Chest 2 View; Future  3. Hyperlipemia -Continue current treatment pending results of lab work - CBC with Differential/Platelet - Lipid panel  4. Primary osteoarthritis of both knees -Appointment with  orthopedics - CBC with Differential/Platelet  5. Bilateral knee pain -Appointment with orthopedic  6. Hearing loss of both ears due to cerumen impaction -This continues to be an issue and she wears a hearing aid only in the left ear.  7. Senile purpura ([place]) -The patient will make more efforts at moving slowly and not turning quickly to prevent increased bruising  8. Urinary frequency -Check urinalysis  Patient Instructions                       Medicare Annual Wellness Visit  [facility] and the medical providers at [facility] strive to bring you the best medical care.  In doing so we not only want to address your current medical conditions and concerns but also to detect new conditions early and prevent illness, disease and health-related problems.    Medicare offers a yearly Wellness Visit which allows our clinical staff to assess your need  for preventative services including immunizations, lifestyle education, counseling to decrease risk of preventable diseases and screening for fall risk and other medical concerns.    This visit is provided free of charge (no copay) for all Medicare recipients. The clinical pharmacists at [facility] have begun to conduct these Wellness Visits which will also include a thorough review of all your medications.    As you primary medical provider recommend that you make an appointment for your Annual Wellness Visit if you have not done so already this year.  You may set up this appointment before you leave today or you may call back [phone]) and schedule an appointment.  Please make sure when you call that you mention that you are scheduling your Annual Wellness Visit with the clinical pharmacist so that the appointment may be made for the proper length of time.     Continue current medications. Continue good therapeutic lifestyle changes which include good diet and exercise. Fall precautions discussed  with patient. If an FOBT was given today- please return it to our front desk. If you are over 11 years old - you may need Prevnar 60 or the adult Pneumonia vaccine.   After your visit with [place] today you will receive a survey in the mail or online from [employer] regarding your care with [place]. Please take a moment to fill this out. Your feedback is very important to [place] as you can help [place] better understand your patient needs as well as improve your experience and satisfaction. WE CARE ABOUT YOU!!!   The patient should continue to get as much exercise in her home as possible She should continue to be careful not put herself at risk for falling and move slowly She should drink is much fluid as possible to stay well hydrated She should continue with her medication for constipation We will call her sister with the lab results and chest x-ray results as soon as they become available   Arrie Senate MD

## 2015-12-15 LAB — BMP8+EGFR
BUN/Creatinine Ratio: 36 — ABNORMAL HIGH (ref 12–28)
BUN: 24 mg/dL (ref 10–36)
CALCIUM: 9.1 mg/dL (ref 8.7–10.3)
CHLORIDE: 104 mmol/L (ref 96–106)
CO2: 25 mmol/L (ref 18–29)
Creatinine, Ser: 0.66 mg/dL (ref 0.57–1.00)
GFR calc Af Amer: 85 mL/min/{1.73_m2} (ref >59)
GFR calc non Af Amer: 74 mL/min/{1.73_m2} (ref >59)
GLUCOSE: 104 mg/dL — AB (ref 65–99)
Potassium: 4.5 mmol/L (ref 3.5–5.2)
Sodium: 144 mmol/L (ref 134–144)

## 2015-12-15 LAB — LIPID PANEL
Chol/HDL Ratio: 2.3 ratio units (ref 0.0–4.4)
Cholesterol, Total: 117 mg/dL (ref 100–199)
HDL: 52 mg/dL (ref >39)
LDL CALC: 43 mg/dL (ref 0–99)
Triglycerides: 110 mg/dL (ref 0–149)
VLDL CHOLESTEROL CAL: 22 mg/dL (ref 5–40)

## 2015-12-15 LAB — HEPATIC FUNCTION PANEL
ALT: 10 IU/L (ref 0–32)
AST: 18 IU/L (ref 0–40)
Albumin: 4 g/dL (ref 3.2–4.6)
Alkaline Phosphatase: 81 IU/L (ref 39–117)
BILIRUBIN TOTAL: 0.6 mg/dL (ref 0.0–1.2)
BILIRUBIN, DIRECT: 0.16 mg/dL (ref 0.00–0.40)
TOTAL PROTEIN: 6.1 g/dL (ref 6.0–8.5)

## 2015-12-15 LAB — CBC WITH DIFFERENTIAL/PLATELET
BASOS ABS: 0 10*3/uL (ref 0.0–0.2)
BASOS: 0 %
EOS (ABSOLUTE): 0.1 10*3/uL (ref 0.0–0.4)
Eos: 1 %
Hematocrit: 39.1 % (ref 34.0–46.6)
Hemoglobin: 12.9 g/dL (ref 11.1–15.9)
IMMATURE GRANS (ABS): 0 10*3/uL (ref 0.0–0.1)
IMMATURE GRANULOCYTES: 0 %
LYMPHS: 27 %
Lymphocytes Absolute: 1.8 10*3/uL (ref 0.7–3.1)
MCH: 31.3 pg (ref 26.6–33.0)
MCHC: 33 g/dL (ref 31.5–35.7)
MCV: 95 fL (ref 79–97)
Monocytes Absolute: 0.4 10*3/uL (ref 0.1–0.9)
Monocytes: 6 %
NEUTROS PCT: 66 %
Neutrophils Absolute: 4.5 10*3/uL (ref 1.4–7.0)
PLATELETS: 262 10*3/uL (ref 150–379)
RBC: 4.12 x10E6/uL (ref 3.77–5.28)
RDW: 12.9 % (ref 12.3–15.4)
WBC: 6.8 10*3/uL (ref 3.4–10.8)

## 2015-12-15 LAB — VITAMIN D 25 HYDROXY (VIT D DEFICIENCY, FRACTURES): VIT D 25 HYDROXY: 42.3 ng/mL (ref 30.0–100.0)

## 2015-12-16 ENCOUNTER — Ambulatory Visit: Payer: Medicare Other | Admitting: Family Medicine

## 2016-01-04 ENCOUNTER — Telehealth: Payer: Self-pay | Admitting: Family Medicine

## 2016-01-04 DIAGNOSIS — M25562 Pain in left knee: Principal | ICD-10-CM

## 2016-01-04 DIAGNOSIS — M25561 Pain in right knee: Secondary | ICD-10-CM

## 2016-01-04 NOTE — Telephone Encounter (Signed)
Referral placed.

## 2016-01-04 NOTE — Telephone Encounter (Signed)
Please address  There is no referral

## 2016-01-04 NOTE — Telephone Encounter (Signed)
Please do a referral for  next visit with orthopedic surgeon in this office

## 2016-01-05 ENCOUNTER — Telehealth: Payer: Self-pay | Admitting: Family Medicine

## 2016-01-20 DIAGNOSIS — M1711 Unilateral primary osteoarthritis, right knee: Secondary | ICD-10-CM | POA: Diagnosis not present

## 2016-01-20 DIAGNOSIS — M1712 Unilateral primary osteoarthritis, left knee: Secondary | ICD-10-CM | POA: Diagnosis not present

## 2016-01-20 DIAGNOSIS — M170 Bilateral primary osteoarthritis of knee: Secondary | ICD-10-CM | POA: Diagnosis not present

## 2016-01-27 ENCOUNTER — Ambulatory Visit (INDEPENDENT_AMBULATORY_CARE_PROVIDER_SITE_OTHER): Payer: Medicare Other

## 2016-01-27 DIAGNOSIS — Z23 Encounter for immunization: Secondary | ICD-10-CM

## 2016-04-20 ENCOUNTER — Ambulatory Visit (INDEPENDENT_AMBULATORY_CARE_PROVIDER_SITE_OTHER): Payer: Medicare Other | Admitting: Family Medicine

## 2016-04-20 ENCOUNTER — Encounter: Payer: Self-pay | Admitting: Family Medicine

## 2016-04-20 ENCOUNTER — Encounter (INDEPENDENT_AMBULATORY_CARE_PROVIDER_SITE_OTHER): Payer: Self-pay

## 2016-04-20 VITALS — BP 161/68 | HR 69 | Temp 96.7°F | Ht 63.0 in | Wt 131.0 lb

## 2016-04-20 DIAGNOSIS — G8929 Other chronic pain: Secondary | ICD-10-CM

## 2016-04-20 DIAGNOSIS — E7800 Pure hypercholesterolemia, unspecified: Secondary | ICD-10-CM

## 2016-04-20 DIAGNOSIS — I10 Essential (primary) hypertension: Secondary | ICD-10-CM | POA: Diagnosis not present

## 2016-04-20 DIAGNOSIS — M25562 Pain in left knee: Secondary | ICD-10-CM

## 2016-04-20 DIAGNOSIS — R42 Dizziness and giddiness: Secondary | ICD-10-CM

## 2016-04-20 DIAGNOSIS — D692 Other nonthrombocytopenic purpura: Secondary | ICD-10-CM | POA: Diagnosis not present

## 2016-04-20 DIAGNOSIS — E559 Vitamin D deficiency, unspecified: Secondary | ICD-10-CM

## 2016-04-20 DIAGNOSIS — M25561 Pain in right knee: Secondary | ICD-10-CM

## 2016-04-20 DIAGNOSIS — H6123 Impacted cerumen, bilateral: Secondary | ICD-10-CM

## 2016-04-20 NOTE — Patient Instructions (Addendum)
Medicare Annual Wellness Visit  [facility] and the medical providers at [facility] strive to bring you the best medical care.  In doing so we not only want to address your current medical conditions and concerns but also to detect new conditions early and prevent illness, disease and health-related problems.    Medicare offers a yearly Wellness Visit which allows our clinical staff to assess your need for preventative services including immunizations, lifestyle education, counseling to decrease risk of preventable diseases and screening for fall risk and other medical concerns.    This visit is provided free of charge (no copay) for all Medicare recipients. The clinical pharmacists at [facility] have begun to conduct these Wellness Visits which will also include a thorough review of all your medications.    As you primary medical provider recommend that you make an appointment for your Annual Wellness Visit if you have not done so already this year.  You may set up this appointment before you leave today or you may call back [phone] and schedule an appointment.  Please make sure when you call that you mention that you are scheduling your Annual Wellness Visit with the clinical pharmacist so that the appointment may be made for the proper length of time.    Continue current medications. Continue good therapeutic lifestyle changes which include good diet and exercise. Fall precautions discussed with patient. If an FOBT was given today- please return it to our front desk. If you are over 29 years old - you may need Prevnar 31 or the adult Pneumonia vaccine.  **Flu shots are available--- please call and schedule a FLU-CLINIC appointment**  After your visit with [place] today you will receive a survey in the mail or online from [employer] regarding your care with [place]. Please take a moment to fill this out. Your feedback is very  important to [place] as you can help [place] better understand your patient needs as well as improve your experience and satisfaction. WE CARE ABOUT YOU!!!  Make sure that you check your hearing aid periodically for build up of wax and change the part that goes into year here periodically Continue to drink plenty of fluids and stay well hydrated Continue to move slowly and always use a supportive device whether using a walker or the cane. Continue to drink plenty of fluids

## 2016-04-20 NOTE — Progress Notes (Signed)
Subjective:    Patient ID: Marissa Burns, female    DOB: 1914-12-28, 83 y.o.   MRN: [id number]  HPI Pt here for follow up and management of chronic medical problems which includes hyperlipidemia and hypertension. She is taking medication regularly.Patient comes to the visit today with her sister with whom she is very close. She still lives at home by herself and she is 83 years old and will be 54 in December of this year. She is given an FOBT to return and will get lab work today. She still complains of left knee pain and she has end-stage disease in this knee. She sees the orthopedist that comes here periodically. The patient is pleasant and alert for her age of 78 years and much longer looking than her stated age. Her biggest issues continue to be the arthritis which is end-stage in both the left and right knee with the left being worse than the right. She sees the orthopedic specialist periodically he does do injections and she gets very little relief from this. She has had a couple of falls recently and she says she gets dizzy when she has fallen. She understands to stand before she starts walking and she understands also to use her cane. She denies any chest pain or shortness of breath she denies any trouble with her stomach including nausea vomiting diarrhea or blood in the stool or black tarry bowel movements. She does have ongoing constipation issues which he is had for years. She is passing her water frequently but no burning or pain when she voids. She is hearing impaired and does have a [employer].    Patient Active Problem List   Diagnosis Date Noted  . BPPV (benign paroxysmal positional vertigo) 12/10/2014  . Visual impairment 01/22/2014  . Chronic constipation 10/08/2013  . Primary osteoarthritis of both knees 10/08/2013  . Hearing deficit 05/20/2013  . Hypertension 10/30/2012  . Hyperlipemia 10/30/2012  . Vitamin D deficiency 10/30/2012  . Arthritis 10/30/2012  . Colon polyp     . Diverticulosis   . Osteoarthritis   . Edema   . Osteoporosis   . Elevated blood sugar   . Cataract    Outpatient Encounter Prescriptions as of 04/20/2016  Medication Sig  . acetaminophen (TYLENOL) 500 MG tablet Take 1,000 mg by mouth every morning.   Marland Kitchen amLODipine (NORVASC) 5 MG tablet Take 1 tablet (5 mg total) by mouth daily.  Marland Kitchen aspirin 81 MG EC tablet Take 81 mg by mouth every morning.   Marland Kitchen atorvastatin (LIPITOR) 80 MG tablet Take 1 tablet (80 mg total) by mouth daily.  . benazepril (LOTENSIN) 20 MG tablet TAKE ONE TABLET BY MOUTH ONE  TIME DAILY  . busPIRone (BUSPAR) 5 MG tablet TAKE ONE TABLET BY MOUTH TWICE DAILY FOR ANXIETY  . calcium carbonate (TUMS - DOSED IN MG ELEMENTAL CALCIUM) 500 MG chewable tablet Chew 1 tablet by mouth daily.    . Cholecalciferol (VITAMIN D3) 2000 UNITS TABS Take 2 tablets by mouth daily after breakfast.   . furosemide (LASIX) 20 MG tablet TAKE ONE TABLET BY MOUTH ONE  TIME DAILY  . Linaclotide (LINZESS) 145 MCG CAPS capsule Take 1 capsule (145 mcg total) by mouth daily.  . [DISCONTINUED] meclizine (ANTIVERT) 12.5 MG tablet Take 1 tablet (12.5 mg total) by mouth 3 (three) times daily as needed for dizziness.   No facility-administered encounter medications on file as of 04/20/2016.       Review of Systems  Constitutional: Negative.  HENT: Negative.   Eyes: Negative.   Respiratory: Negative.   Cardiovascular: Negative.   Gastrointestinal: Negative.   Endocrine: Negative.   Genitourinary: Negative.   Musculoskeletal: Positive for arthralgias (left knee pain).  Skin: Negative.   Allergic/Immunologic: Negative.   Neurological: Negative.   Hematological: Negative.   Psychiatric/Behavioral: Negative.        Objective:   Physical Exam  Constitutional: She is oriented to person, place, and time. She appears well-developed and well-nourished. No distress.  Elderly pleasant 83 year old female who appears much younger than her stated age.  HENT:   Head: Normocephalic and atraumatic.  Nose: Nose normal.  Mouth/Throat: Oropharynx is clear and moist.  Bilateral ears cerumen  Eyes: Conjunctivae and EOM are normal. Pupils are equal, round, and reactive to light. Right eye exhibits no discharge. Left eye exhibits no discharge. No scleral icterus.  Neck: Normal range of motion. Neck supple. No thyromegaly present.  No Masses or bruits or adenopathy  Cardiovascular: Normal rate, regular rhythm and normal heart sounds.   No murmur heard. The heart is 72/m with a regular rate and rhythm  Pulmonary/Chest: Effort normal and breath sounds normal. No respiratory distress. She has no wheezes. She has no rales.  Abdominal: Soft. Bowel sounds are normal. She exhibits no mass. There is no tenderness. There is no rebound and no guarding.  Musculoskeletal: She exhibits edema and tenderness.  There is some pedal edema in both feet most likely secondary to the severe arthritic condition in her knees with left being greater than right. The patient continues to have bilateral knee pain in joint line tenderness in both knees with left being greater than the right. She has end-stage arthritis.  Lymphadenopathy:    She has no cervical adenopathy.  Neurological: She is alert and oriented to person, place, and time. She has normal reflexes. No cranial nerve deficit.  Skin: Skin is warm and dry. No rash noted.  Psychiatric: She has a normal mood and affect. Her behavior is normal. Judgment and thought content normal.  Nursing note and vitals reviewed.  BP (!) 161/68 (BP Location: Left Arm)   Pulse 69   Temp (!) 96.7 F (35.9 C) (Oral)   Ht _0  (1.6 m)   Wt 131 lb (59.4 kg)   BMI 23.21 kg/m        Assessment & Plan:  1. Vitamin D deficiency -Continue current treatment pending results of lab work - CBC with Differential/Platelet - VITAMIN D 25 Hydroxy (Vit-D Deficiency, Fractures)  2. Pure hypercholesterolemia -Continue current treatment pending  results of lab work - CBC with Differential/Platelet - Lipid panel  3. Essential hypertension -The repeat blood pressure systolic remained elevated but there will be no change in treatment because this patient lives by herself at home and does have episodes of dizziness. - BMP8+EGFR - CBC with Differential/Platelet - Hepatic function panel  4. Chronic pain of both knees -See orthopedics as needed  5. Dizziness and giddiness -Use walker and cane  6. Senile purpura (HCC) -No increased purpura noted today.  7. 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  8. Obstruction of ventilation tube of both ears by cerumen -Ear irrigation bilaterally  Patient Instructions  Medicare Annual Wellness Visit  [facility] and the medical providers at [facility] strive to bring you the best medical care.  In doing so we not only want to address your current medical conditions and concerns but also to detect new conditions early and prevent illness, disease and health-related problems.    Medicare offers a yearly Wellness Visit which allows our clinical staff to assess your need for preventative services including immunizations, lifestyle education, counseling to decrease risk of preventable diseases and screening for fall risk and other medical concerns.    This visit is provided free of charge (no copay) for all Medicare recipients. The clinical pharmacists at [facility] have begun to conduct these Wellness Visits which will also include a thorough review of all your medications.    As you primary medical provider recommend that you make an appointment for your Annual Wellness Visit if you have not done so already this year.  You may set up this  appointment before you leave today or you may call back ([phone]) and schedule an appointment.  Please make sure when you call that you mention that you are scheduling your Annual Wellness Visit with the clinical pharmacist so that the appointment may be made for the proper length of time.    Continue current medications. Continue good therapeutic lifestyle changes which include good diet and exercise. Fall precautions discussed with patient. If an FOBT was given today- please return it to our front desk. If you are over 73 years old - you may need Prevnar 39 or the adult Pneumonia vaccine.  **Flu shots are available--- please call and schedule a FLU-CLINIC appointment**  After your visit with [place] today you will receive a survey in the mail or online from [employer] regarding your care with [place]. Please take a moment to fill this out. Your feedback is very important to [place] as you can help [place] better understand your patient needs as well as improve your experience and satisfaction. WE CARE ABOUT YOU!!!  Make sure that you check your hearing aid periodically for build up of wax and change the part that goes into year here periodically Continue to drink plenty of fluids and stay well hydrated Continue to move slowly and always use a supportive device whether using a walker or the cane. Continue to drink plenty of fluids   Arrie Senate MD

## 2016-04-21 LAB — CBC WITH DIFFERENTIAL/PLATELET
BASOS: 0 %
Basophils Absolute: 0 10*3/uL (ref 0.0–0.2)
EOS (ABSOLUTE): 0.1 10*3/uL (ref 0.0–0.4)
EOS: 1 %
HEMATOCRIT: 38.7 % (ref 34.0–46.6)
HEMOGLOBIN: 13 g/dL (ref 11.1–15.9)
IMMATURE GRANS (ABS): 0 10*3/uL (ref 0.0–0.1)
IMMATURE GRANULOCYTES: 0 %
LYMPHS: 24 %
Lymphocytes Absolute: 1.5 10*3/uL (ref 0.7–3.1)
MCH: 32.6 pg (ref 26.6–33.0)
MCHC: 33.6 g/dL (ref 31.5–35.7)
MCV: 97 fL (ref 79–97)
MONOCYTES: 8 %
Monocytes Absolute: 0.5 10*3/uL (ref 0.1–0.9)
NEUTROS PCT: 67 %
Neutrophils Absolute: 4 10*3/uL (ref 1.4–7.0)
Platelets: 259 10*3/uL (ref 150–379)
RBC: 3.99 x10E6/uL (ref 3.77–5.28)
RDW: 13.3 % (ref 12.3–15.4)
WBC: 6 10*3/uL (ref 3.4–10.8)

## 2016-04-21 LAB — LIPID PANEL
CHOL/HDL RATIO: 2.3 ratio (ref 0.0–4.4)
Cholesterol, Total: 122 mg/dL (ref 100–199)
HDL: 52 mg/dL (ref >39)
LDL Calculated: 46 mg/dL (ref 0–99)
Triglycerides: 121 mg/dL (ref 0–149)
VLDL Cholesterol Cal: 24 mg/dL (ref 5–40)

## 2016-04-21 LAB — BMP8+EGFR
BUN / CREAT RATIO: 20 (ref 12–28)
BUN: 12 mg/dL (ref 10–36)
CO2: 26 mmol/L (ref 18–29)
CREATININE: 0.59 mg/dL (ref 0.57–1.00)
Calcium: 9 mg/dL (ref 8.7–10.3)
Chloride: 102 mmol/L (ref 96–106)
GFR, EST AFRICAN AMERICAN: 88 mL/min/{1.73_m2} (ref >59)
GFR, EST NON AFRICAN AMERICAN: 76 mL/min/{1.73_m2} (ref >59)
Glucose: 112 mg/dL — ABNORMAL HIGH (ref 65–99)
Potassium: 4.5 mmol/L (ref 3.5–5.2)
SODIUM: 143 mmol/L (ref 134–144)

## 2016-04-21 LAB — HEPATIC FUNCTION PANEL
ALBUMIN: 4 g/dL (ref 3.2–4.6)
ALK PHOS: 75 IU/L (ref 39–117)
ALT: 14 IU/L (ref 0–32)
AST: 19 IU/L (ref 0–40)
BILIRUBIN TOTAL: 0.6 mg/dL (ref 0.0–1.2)
BILIRUBIN, DIRECT: 0.23 mg/dL (ref 0.00–0.40)
Total Protein: 6.3 g/dL (ref 6.0–8.5)

## 2016-04-21 LAB — VITAMIN D 25 HYDROXY (VIT D DEFICIENCY, FRACTURES): VIT D 25 HYDROXY: 44.1 ng/mL (ref 30.0–100.0)

## 2016-05-05 ENCOUNTER — Encounter: Payer: Self-pay | Admitting: Pediatrics

## 2016-05-05 ENCOUNTER — Ambulatory Visit (INDEPENDENT_AMBULATORY_CARE_PROVIDER_SITE_OTHER): Payer: Medicare Other | Admitting: Pediatrics

## 2016-05-05 VITALS — BP 140/68 | HR 86 | Temp 97.0°F | Ht 62.0 in | Wt 135.0 lb

## 2016-05-05 DIAGNOSIS — L03116 Cellulitis of left lower limb: Secondary | ICD-10-CM

## 2016-05-05 MED ORDER — DOXYCYCLINE HYCLATE 100 MG PO TABS: 100.0000 mg | ORAL_TABLET | Freq: Two times a day (BID) | ORAL | 0 refills | Status: DC

## 2016-05-05 NOTE — Progress Notes (Signed)
  Subjective:   Patient ID: Marissa Burns, female    DOB: 05/10/1914, 83 y.o.   MRN: [id number] CC: Leg Swelling (left lower) and rednness (left lower leg)  HPI: Marissa Burns is a 83 y.o. female presenting for Leg Swelling (left lower) and rednness (left lower leg)  Started yesterday with soreness Several months ago hit L shin, has had a slowly healing area since then No breaks in the skin recently Uses lotion daily  Noticed red splotchy rash on b/l lower legs yesterday and today Leg was sore when she got up from nap today, family called ems for evaluation and they were told she should be seen in clinic Can walk on leg, does have soreness Here today with her sister No fevers Rash on legs doesn't itch Normal appetite Otherwise feeling well  Relevant past medical, surgical, family and social history reviewed. Allergies and medications reviewed and updated. History  Smoking Status  . Never Smoker  Smokeless Tobacco  . Never Used   ROS: Per HPI   Objective:    BP 140/68   Pulse 86   Temp 97 F (36.1 C) (Oral)   Ht '5\' 2"'$  (1.575 m)   Wt 135 lb (61.2 kg)   BMI 24.69 kg/m   Wt Readings from Last 3 Encounters:  05/05/16 135 lb (61.2 kg)  04/20/16 131 lb (59.4 kg)  12/14/15 136 lb (61.7 kg)    Gen: NAD, alert, cooperative with exam, NCAT EYES: EOMI, no conjunctival injection, or no icterus CV: NRRR, normal S1/S2, no murmur, distal pulses 2+ b/l Resp: CTABL, no wheezes, normal WOB Abd: +BS, soft, NTND. no guarding or organomegaly Ext: Non-pitting edema present b/l ankles and lower legs, warm Neuro: Alert and oriented Skin: L anterior shin with 2 cm purple-red healed ulcer, no breaks in skin, some blanching redness around it, TTP around area. Above and below healed ulceration with mulitple scattered red raised flat topped blnaching red papules 1-84m, a few larger 5-6 mm with some white crust. R LE with similar red blanching papules though fewer  Assessment & Plan:    MTennawas seen today for leg swelling and rednness.  Diagnoses and all orders for this visit:  Cellulitis of left lower extremity Given ttp will treat for cellulitis Can weight bear on leg Not sure the etiology of blanching red , not itching Continue thick moisturizer, checking labs as below Let uKore[place]now if not improving, any worsening -     CBC with Differential/Platelet -     CMP14+EGFR -     doxycycline (VIBRA-TABS) 100 MG tablet; Take 1 tablet (100 mg total) by mouth 2 (two) times daily.   Follow up plan: As scheduled CAssunta Found [place] [facility]

## 2016-05-07 LAB — CMP14+EGFR
ALBUMIN: 3.9 g/dL (ref 3.2–4.6)
ALK PHOS: 70 IU/L (ref 39–117)
ALT: 15 IU/L (ref 0–32)
AST: 20 IU/L (ref 0–40)
Albumin/Globulin Ratio: 2 (ref 1.2–2.2)
BILIRUBIN TOTAL: 0.3 mg/dL (ref 0.0–1.2)
BUN/Creatinine Ratio: 37 — ABNORMAL HIGH (ref 12–28)
BUN: 25 mg/dL (ref 10–36)
CHLORIDE: 106 mmol/L (ref 96–106)
CO2: 18 mmol/L (ref 18–29)
CREATININE: 0.67 mg/dL (ref 0.57–1.00)
Calcium: 8.7 mg/dL (ref 8.7–10.3)
GFR calc Af Amer: 84 mL/min/{1.73_m2} (ref >59)
GFR calc non Af Amer: 73 mL/min/{1.73_m2} (ref >59)
GLUCOSE: 136 mg/dL — AB (ref 65–99)
Globulin, Total: 2 g/dL (ref 1.5–4.5)
Potassium: 4.1 mmol/L (ref 3.5–5.2)
Sodium: 146 mmol/L — ABNORMAL HIGH (ref 134–144)
Total Protein: 5.9 g/dL — ABNORMAL LOW (ref 6.0–8.5)

## 2016-05-07 LAB — CBC WITH DIFFERENTIAL/PLATELET
BASOS ABS: 0 10*3/uL (ref 0.0–0.2)
Basos: 0 %
EOS (ABSOLUTE): 0.1 10*3/uL (ref 0.0–0.4)
Eos: 1 %
HEMOGLOBIN: 12.3 g/dL (ref 11.1–15.9)
Hematocrit: 37 % (ref 34.0–46.6)
IMMATURE GRANS (ABS): 0 10*3/uL (ref 0.0–0.1)
Immature Granulocytes: 0 %
LYMPHS ABS: 1.8 10*3/uL (ref 0.7–3.1)
LYMPHS: 25 %
MCH: 31.5 pg (ref 26.6–33.0)
MCHC: 33.2 g/dL (ref 31.5–35.7)
MCV: 95 fL (ref 79–97)
MONOCYTES: 6 %
Monocytes Absolute: 0.4 10*3/uL (ref 0.1–0.9)
NEUTROS ABS: 4.7 10*3/uL (ref 1.4–7.0)
Neutrophils: 68 %
PLATELETS: 277 10*3/uL (ref 150–379)
RBC: 3.9 x10E6/uL (ref 3.77–5.28)
RDW: 13.2 % (ref 12.3–15.4)
WBC: 7 10*3/uL (ref 3.4–10.8)

## 2016-05-29 ENCOUNTER — Telehealth: Payer: Self-pay | Admitting: Family Medicine

## 2016-05-29 NOTE — Telephone Encounter (Signed)
appt made with Dr. Wendi Snipes for next week

## 2016-06-05 ENCOUNTER — Ambulatory Visit (INDEPENDENT_AMBULATORY_CARE_PROVIDER_SITE_OTHER): Payer: Medicare Other

## 2016-06-05 ENCOUNTER — Encounter: Payer: Self-pay | Admitting: Family Medicine

## 2016-06-05 ENCOUNTER — Ambulatory Visit (INDEPENDENT_AMBULATORY_CARE_PROVIDER_SITE_OTHER): Payer: Medicare Other | Admitting: Family Medicine

## 2016-06-05 VITALS — BP 153/70 | HR 67 | Temp 97.0°F | Ht 62.0 in | Wt 135.0 lb

## 2016-06-05 DIAGNOSIS — M25562 Pain in left knee: Principal | ICD-10-CM

## 2016-06-05 DIAGNOSIS — M170 Bilateral primary osteoarthritis of knee: Secondary | ICD-10-CM | POA: Diagnosis not present

## 2016-06-05 DIAGNOSIS — M25561 Pain in right knee: Secondary | ICD-10-CM

## 2016-06-05 MED ORDER — DICLOFENAC SODIUM 1 % TD GEL: 2.0000 g | Freq: Four times a day (QID) | TRANSDERMAL | 2 refills | Status: DC

## 2016-06-05 NOTE — Progress Notes (Signed)
   HPI  Patient presents today here with knee pain.  Pt c/o R Knee pain worsening for 2 + weeks. Pt has Hx of L>R knee arthritis.  Patient states that she's had many knee injections of the left knee, she's had discussed supplementation in bilateral knees 2-3 years ago by orthopedics. She cannot remember another steroid injection in the right knee.  Patient has a follow-up with her orthopedist in about 3 weeks, she also complains of left knee pain and would like to have both injected, I recommended following up with him for the left knee.  PMH: Smoking status noted ROS: Per HPI  Objective: BP (!) 153/70   Pulse 67   Temp 97 F (36.1 C) (Oral)   Ht 5\' 2"  (1.575 m)   Wt 135 lb (61.2 kg)   BMI 24.69 kg/m  Gen: NAD, alert, cooperative with exam HEENT: NCAT, EOMI, PERRL CV: RRR, good S1/S2, no murmur Resp: CTABL, no wheezes, non-labored Ext: No edema, warm Neuro: Alert and oriented, No gross deficits  MSK: R knee without erythema,  bruising, or gross deformity Mild effusion No joint line tenderness.  ligamentously intact to Lachman's and with varus and valgus stress.  Negative McMurray's test  Right knee injection Area was prepped with Betadine 1 and wiped clear with alcohol, using cold spray the are was anesthetized. With 1 mL of 40 mg/mL Kenalog + 3 ml 1 % lido without and a 22 G X 1.5 inch needle the knee was injected via medial approach. Area covered with sterile bandage. Pt tolerated well.   Plain film shows severe medial compartment joint space loss on bilateral knees as well as sclerosis and osteophytes.  Assessment and plan:  # Bilateral knee arthritis, right greater than left Patient with current flare of right knee osteoarthritis. Patient with many injections in the left knee, offered right knee injection today and follow-up with her orthopedist to discuss left knee pain as scheduled. Kenalog injected and right knee today, Voltaren gel for bilateral knees as  needed. Informed consent discussed and placed on the chart.     Orders Placed This Encounter  Procedures  . DG Knee 1-2 Views Left    Standing Status:   Future    Number of Occurrences:   1    Standing Expiration Date:   08/05/2017    Order Specific Question:   Reason for Exam (SYMPTOM  OR DIAGNOSIS REQUIRED)    Answer:   knee pain    Order Specific Question:   Preferred imaging location?    Answer:   Internal  . DG Knee 1-2 Views Right    Standing Status:   Future    Number of Occurrences:   1    Standing Expiration Date:   08/05/2017    Order Specific Question:   Reason for Exam (SYMPTOM  OR DIAGNOSIS REQUIRED)    Answer:   knee pain    Order Specific Question:   Preferred imaging location?    Answer:   Internal    No orders of the defined types were placed in this encounter.   Laroy Apple, MD [facility] 06/05/2016, 11:28 AM

## 2016-06-05 NOTE — Patient Instructions (Addendum)
Great to see you!  Please seek medical care for any fever or redness in the knee after injection. A little tightness is normal but the pain should begin subsiding within 12 hours.   Use diclofenac gel up to 4 times daily, wait about 1 day before application to the knee we injected today.

## 2016-06-29 DIAGNOSIS — G8929 Other chronic pain: Secondary | ICD-10-CM | POA: Diagnosis not present

## 2016-06-29 DIAGNOSIS — M25562 Pain in left knee: Secondary | ICD-10-CM | POA: Diagnosis not present

## 2016-06-29 DIAGNOSIS — M1712 Unilateral primary osteoarthritis, left knee: Secondary | ICD-10-CM | POA: Diagnosis not present

## 2016-06-29 DIAGNOSIS — M1711 Unilateral primary osteoarthritis, right knee: Secondary | ICD-10-CM | POA: Diagnosis not present

## 2016-07-17 ENCOUNTER — Other Ambulatory Visit: Payer: Self-pay | Admitting: Family Medicine

## 2016-07-27 ENCOUNTER — Ambulatory Visit (INDEPENDENT_AMBULATORY_CARE_PROVIDER_SITE_OTHER): Payer: Medicare Other | Admitting: Family

## 2016-07-27 ENCOUNTER — Encounter: Payer: Self-pay | Admitting: Family

## 2016-07-27 VITALS — BP 149/67 | HR 67 | Temp 97.0°F | Ht 62.0 in | Wt 129.8 lb

## 2016-07-27 DIAGNOSIS — M1711 Unilateral primary osteoarthritis, right knee: Secondary | ICD-10-CM

## 2016-07-27 MED ORDER — BUPIVACAINE HCL 0.25 % IJ SOLN
1.0000 mL | Freq: Once | INTRAMUSCULAR | Status: AC
  Administered 2016-07-27: 1 mL via INTRA_ARTICULAR

## 2016-07-27 MED ORDER — METHYLPREDNISOLONE ACETATE 40 MG/ML IJ SUSP
40.0000 mg | Freq: Once | INTRAMUSCULAR | Status: AC
  Administered 2016-07-27: 40 mg via INTRA_ARTICULAR

## 2016-07-27 NOTE — Patient Instructions (Signed)
Joint Pain Joint pain, which is also called arthralgia, can be caused by many things. Joint pain often goes away when you follow your health care provider's instructions for relieving pain at home. However, joint pain can also be caused by conditions that require further treatment. Common causes of joint pain include:  Bruising in the area of the joint.  Overuse of the joint.  Wear and tear on the joints that occur with aging (osteoarthritis).  Various other forms of arthritis.  A buildup of a crystal form of uric acid in the joint (gout).  Infections of the joint (septic arthritis) or of the bone (osteomyelitis). Your health care provider may recommend medicine to help with the pain. If your joint pain continues, additional tests may be needed to diagnose your condition. Follow these instructions at home: Watch your condition for any changes. Follow these instructions as directed to lessen the pain that you are feeling.  Take medicines only as directed by your health care provider.  Rest the affected area for as long as your health care provider says that you should. If directed to do so, raise the painful joint above the level of your heart while you are sitting or lying down.  Do not do things that cause or worsen pain.  If directed, apply ice to the painful area:  Put ice in a plastic bag.  Place a towel between your skin and the bag.  Leave the ice on for 20 minutes, 2-3 times per day.  Wear an elastic bandage, splint, or sling as directed by your health care provider. Loosen the elastic bandage or splint if your fingers or toes become numb and tingle, or if they turn cold and blue.  Begin exercising or stretching the affected area as directed by your health care provider. Ask your health care provider what types of exercise are safe for you.  Keep all follow-up visits as directed by your health care provider. This is important. Contact a health care provider if:  Your  pain increases, and medicine does not help.  Your joint pain does not improve within 3 days.  You have increased bruising or swelling.  You have a fever.  You lose 10 lb (4.5 kg) or more without trying. Get help right away if:  You are not able to move the joint.  Your fingers or toes become numb or they turn cold and blue. This information is not intended to replace advice given to you by your health care provider. Make sure you discuss any questions you have with your health care provider. Document Released: 03/27/2005 Document Revised: 08/27/2015 Document Reviewed: 01/06/2014 Elsevier Interactive Patient Education  2017 [employer]vier Inc.  

## 2016-07-27 NOTE — Progress Notes (Signed)
   Subjective:    Patient ID: Marissa Burns, female    DOB: 01-28-1915, 83 y.o.   MRN: [id number]  PT presents to the office today with recurrent pain in her right knee.  Knee Pain   Treatments tried: voltaren gel.  Arthritis  Presents for follow-up visit. She complains of pain, joint swelling and joint warmth. The symptoms have been worsening. Affected locations include the right knee. Her pain is at a severity of 9/10. Pertinent negatives include no pain while resting.      Review of Systems  Musculoskeletal: Positive for arthralgias, arthritis and joint swelling.  All other systems reviewed and are negative.      Objective:   Physical Exam  Constitutional: She is oriented to person, place, and time. She appears well-developed and well-nourished. No distress.  Cardiovascular: Normal rate, regular rhythm, normal heart sounds and intact distal pulses.   No murmur heard. Pulmonary/Chest: Effort normal and breath sounds normal. No respiratory distress. She has no wheezes.  Musculoskeletal: Normal range of motion. She exhibits edema (trace in right knee) and tenderness (with flexion and extension).  Neurological: She is alert and oriented to person, place, and time.  Skin: Skin is warm and dry.  Psychiatric: She has a normal mood and affect. Her behavior is normal. Judgment and thought content normal.  Vitals reviewed.  leftknee prepped with betadine Injected with Marcaine .5% plain and methylprednisolone with 22 guage needle x 1. Patient tolerated well.  BP (!) 149/67   Pulse 67   Temp 97 F (36.1 C) (Oral)   Ht 5\' 2"  (1.575 m)   Wt 129 lb 12.8 oz (58.9 kg)   BMI 23.74 kg/m      Assessment & Plan:  1. Primary osteoarthritis of right knee -Rest -Ice  ROM exercises discussed RTO prn and keep follow up with PCP - methylPREDNISolone acetate (DEPO-MEDROL) injection 40 mg; Inject 1 mL (40 mg total) into the articular space once. - bupivacaine (MARCAINE) 0.25 % (with pres)  injection 1 mL; Inject 1 mL into the articular space once.   Evelina Dun, FNP

## 2016-09-11 ENCOUNTER — Ambulatory Visit (INDEPENDENT_AMBULATORY_CARE_PROVIDER_SITE_OTHER): Payer: Medicare Other | Admitting: Family Medicine

## 2016-09-11 ENCOUNTER — Encounter: Payer: Self-pay | Admitting: Family Medicine

## 2016-09-11 VITALS — BP 154/69 | HR 70 | Temp 97.5°F | Ht 62.0 in | Wt 128.6 lb

## 2016-09-11 DIAGNOSIS — I10 Essential (primary) hypertension: Secondary | ICD-10-CM | POA: Diagnosis not present

## 2016-09-11 DIAGNOSIS — K5909 Other constipation: Secondary | ICD-10-CM | POA: Diagnosis not present

## 2016-09-11 DIAGNOSIS — E7800 Pure hypercholesterolemia, unspecified: Secondary | ICD-10-CM | POA: Diagnosis not present

## 2016-09-11 DIAGNOSIS — D692 Other nonthrombocytopenic purpura: Secondary | ICD-10-CM

## 2016-09-11 DIAGNOSIS — R739 Hyperglycemia, unspecified: Secondary | ICD-10-CM

## 2016-09-11 DIAGNOSIS — E559 Vitamin D deficiency, unspecified: Secondary | ICD-10-CM | POA: Diagnosis not present

## 2016-09-11 DIAGNOSIS — H6123 Impacted cerumen, bilateral: Secondary | ICD-10-CM | POA: Diagnosis not present

## 2016-09-11 DIAGNOSIS — K5790 Diverticulosis of intestine, part unspecified, without perforation or abscess without bleeding: Secondary | ICD-10-CM | POA: Diagnosis not present

## 2016-09-11 DIAGNOSIS — M1990 Unspecified osteoarthritis, unspecified site: Secondary | ICD-10-CM

## 2016-09-11 DIAGNOSIS — K635 Polyp of colon: Secondary | ICD-10-CM

## 2016-09-11 DIAGNOSIS — L03116 Cellulitis of left lower limb: Secondary | ICD-10-CM

## 2016-09-11 LAB — BAYER DCA HB A1C WAIVED: HB A1C (BAYER DCA - WAIVED): 5.5 % (ref <7.0)

## 2016-09-11 MED ORDER — CEPHALEXIN 500 MG PO CAPS: 500.0000 mg | ORAL_CAPSULE | Freq: Three times a day (TID) | ORAL | 0 refills | Status: DC

## 2016-09-11 NOTE — Progress Notes (Signed)
Subjective:    Patient ID: Marissa Burns, female    DOB: 02/17/1915, 83 y.o.   MRN: [id number]  HPI Marissa Burns is here today for a 4 month follow up on hyperlipidemia, vitamin D deficiency and hypertension with no complaints or concerns at this time.  She is accompanied by her sister.The patient has her ongoing problems with arthritis in her knees. She is going to be 83 years old in December. She lives by herself. She complains with cellulitis in her left leg and does have some edema and is not wearing any support hose. She also complains of urinary frequency. She denies any chest pain or shortness of breath. She denies any trouble with nausea vomiting diarrhea or blood in the stool.      Patient Active Problem List   Diagnosis Date Noted  . BPPV (benign paroxysmal positional vertigo) 12/10/2014  . Visual impairment 01/22/2014  . Chronic constipation 10/08/2013  . Primary osteoarthritis of both knees 10/08/2013  . Hearing deficit 05/20/2013  . Hypertension 10/30/2012  . Hyperlipemia 10/30/2012  . Vitamin D deficiency 10/30/2012  . Arthritis 10/30/2012  . Colon polyp   . Diverticulosis   . Osteoarthritis   . Edema   . Osteoporosis   . Elevated blood sugar   . Cataract    Outpatient Encounter Prescriptions as of 09/11/2016  Medication Sig  . acetaminophen (TYLENOL) 500 MG tablet Take 1,000 mg by mouth every morning.   Marland Kitchen amLODipine (NORVASC) 5 MG tablet TAKE 1 TABLET (5 MG TOTAL) BY MOUTH DAILY.  Marland Kitchen aspirin 81 MG EC tablet Take 81 mg by mouth every morning.   Marland Kitchen atorvastatin (LIPITOR) 80 MG tablet TAKE 1 TABLET (80 MG TOTAL) BY MOUTH DAILY.  . benazepril (LOTENSIN) 20 MG tablet TAKE ONE TABLET BY MOUTH ONE TIME DAILY  . busPIRone (BUSPAR) 5 MG tablet TAKE ONE TABLET BY MOUTH TWICE DAILY FOR ANXIETY  . calcium carbonate (TUMS - DOSED IN MG ELEMENTAL CALCIUM) 500 MG chewable tablet Chew 1 tablet by mouth daily.    . Cholecalciferol (VITAMIN D3) 2000 UNITS TABS Take 2 tablets by  mouth daily after breakfast.   . diclofenac sodium (VOLTAREN) 1 % GEL Apply 2 g topically 4 (four) times daily.  . furosemide (LASIX) 20 MG tablet TAKE ONE TABLET BY MOUTH ONE  TIME DAILY  . Linaclotide (LINZESS) 145 MCG CAPS capsule Take 1 capsule (145 mcg total) by mouth daily.  Marland Kitchen PAZEO 0.7 % SOLN    No facility-administered encounter medications on file as of 09/11/2016.        Review of Systems  Constitutional: Negative.   HENT: Negative.   Eyes: Negative.   Respiratory: Negative.   Cardiovascular: Negative.   Gastrointestinal: Negative.   Endocrine: Negative.   Genitourinary: Negative.   Musculoskeletal: Positive for gait problem.  Skin: Negative.   Allergic/Immunologic: Negative.   Hematological: Negative.   Psychiatric/Behavioral: Negative.        Objective:   Physical Exam  Constitutional: She is oriented to person, place, and time. She appears well-nourished. No distress.  Elderly and somewhat frail and with hearing deficit.  HENT:  Head: Normocephalic and atraumatic.  Right Ear: External ear normal.  Left Ear: External ear normal.  Mouth/Throat: Oropharynx is clear and moist. No oropharyngeal exudate.  Some nasal congestion bilaterally  Eyes: Conjunctivae and EOM are normal. Pupils are equal, round, and reactive to light. Right eye exhibits no discharge. Left eye exhibits no discharge. No scleral icterus.  Neck: Normal range  of motion. Neck supple. No thyromegaly present.  No bruits or thyromegaly  Cardiovascular: Normal rate and regular rhythm.   Murmur heard. The heart is 60/m with a regular rate and rhythm and a grade 2/6 systolic ejection murmur  Pulmonary/Chest: Effort normal and breath sounds normal. No respiratory distress. She has no wheezes. She has no rales.  Clear anteriorly and posteriorly  Abdominal: Soft. Bowel sounds are normal. She exhibits no mass. There is no tenderness. There is no rebound and no guarding.  Some slight suprapubic tenderness  otherwise no organ enlargement masses or bruits  Musculoskeletal: She exhibits edema and tenderness.  One plus pedal edema left greater than right The patient uses a cane for ambulation due to her arthritis in her knees.  Lymphadenopathy:    She has no cervical adenopathy.  Neurological: She is alert and oriented to person, place, and time.  Skin: Skin is warm and dry. Rash noted. There is erythema.  There is an erythematous rash of the left lower extremity anteriorly with some pedal edema. This is most likely a cellulitis due to venous insufficiency.  Psychiatric: She has a normal mood and affect. Her behavior is normal. Judgment and thought content normal.  Nursing note and vitals reviewed.  BP (!) 154/69   Pulse 70   Temp 97.5 F (36.4 C) (Oral)   Ht _0  (1.575 m)   Wt 128 lb 9.6 oz (58.3 kg)   BMI 23.52 kg/m         Assessment & Plan:  1. Essential hypertension -The blood pressure is elevated today. It was elevated on 2 occasions. We are increasing the Lasix because of the edema in her feet and a cellulitis and this may help the blood pressure some also. - CBC with Differential/Platelet - BMP8+EGFR - Hepatic function panel - Lipid panel  2. Polyp of colon, unspecified part of colon, unspecified type -No further follow-up is planned. - CBC with Differential/Platelet - BMP8+EGFR - Fecal occult blood, imunochemical; Future  3. Chronic constipation -She is doing better with this because of the linzess - CBC with Differential/Platelet - BMP8+EGFR - Fecal occult blood, imunochemical; Future  4. Arthritis -This is an ongoing problem especially in her knees and she is seen the orthopedist on multiple occasions. - CBC with Differential/Platelet - BMP8+EGFR  5. Elevated blood sugar - Bayer DCA Hb A1c Waived - CBC with Differential/Platelet - BMP8+EGFR  6. Vitamin D deficiency -Continue current treatment pending results of lab work - CBC with  Differential/Platelet - BMP8+EGFR  7. Pure hypercholesterolemia -Continue atorvastatin pending results of lab work - CBC with Differential/Platelet - BMP8+EGFR - Hepatic function panel - Lipid panel  8. Diverticulosis of intestine without bleeding, unspecified intestinal tract location -She is currently doing well with this with no complaints today. - CBC with Differential/Platelet - BMP8+EGFR  9. Cellulitis of left lower extremity -This is a recurrent problem. This is been worse for the past 2-3 weeks. -She will take Keflex 500 mg 3 times a day for 10 days -She will increase her Lasix and take 40 mg daily for the next couple weeks until she is rechecked -She will start wearing support hose that we'll be put on the first thing with getting up in the morning  10. Senile purpura ([place]) -This is stable.  11. Hearing loss of both ears due to cerumen impaction -She continues to wear hearing aid devices specifically in the left ear.  Patient Instructions  Medicare Annual Wellness Visit  [facility] and the medical providers at [facility] strive to bring you the best medical care.  In doing so we not only want to address your current medical conditions and concerns but also to detect new conditions early and prevent illness, disease and health-related problems.    Medicare offers a yearly Wellness Visit which allows our clinical staff to assess your need for preventative services including immunizations, lifestyle education, counseling to decrease risk of preventable diseases and screening for fall risk and other medical concerns.    This visit is provided free of charge (no copay) for all Medicare recipients. The clinical pharmacists at [facility] have begun to conduct these Wellness Visits which will also include a thorough review of all your medications.    As you primary medical provider recommend that you make an  appointment for your Annual Wellness Visit if you have not done so already this year.  You may set up this appointment before you leave today or you may call back ([phone]) and schedule an appointment.  Please make sure when you call that you mention that you are scheduling your Annual Wellness Visit with the clinical pharmacist so that the appointment may be made for the proper length of time.     Continue current medications. Continue good therapeutic lifestyle changes which include good diet and exercise. Fall precautions discussed with patient. If an FOBT was given today- please return it to our front desk. If you are over 108 years old - you may need Prevnar 10 or the adult Pneumonia vaccine.   After your visit with [place] today you will receive a survey in the mail or online from [employer] regarding your care with [place]. Please take a moment to fill this out. Your feedback is very important to [place] as you can help [place] better understand your patient needs as well as improve your experience and satisfaction. WE CARE ABOUT YOU!!!   You will be asked to increase the Lasix to 2 daily instead of one daily. You'll be started on an antibiotic which she should take and complete because of the cellulitis in your legs She should wear support hose daily and the should be placed on the legs the first thing with getting up in the morning She should consider going to an assisted living facility and she says she has been thinking about this.  Arrie Senate MD

## 2016-09-11 NOTE — Patient Instructions (Addendum)
Medicare Annual Wellness Visit  [facility] and the medical providers at [facility] strive to bring you the best medical care.  In doing so we not only want to address your current medical conditions and concerns but also to detect new conditions early and prevent illness, disease and health-related problems.    Medicare offers a yearly Wellness Visit which allows our clinical staff to assess your need for preventative services including immunizations, lifestyle education, counseling to decrease risk of preventable diseases and screening for fall risk and other medical concerns.    This visit is provided free of charge (no copay) for all Medicare recipients. The clinical pharmacists at [facility] have begun to conduct these Wellness Visits which will also include a thorough review of all your medications.    As you primary medical provider recommend that you make an appointment for your Annual Wellness Visit if you have not done so already this year.  You may set up this appointment before you leave today or you may call back ([phone]) and schedule an appointment.  Please make sure when you call that you mention that you are scheduling your Annual Wellness Visit with the clinical pharmacist so that the appointment may be made for the proper length of time.     Continue current medications. Continue good therapeutic lifestyle changes which include good diet and exercise. Fall precautions discussed with patient. If an FOBT was given today- please return it to our front desk. If you are over 59 years old - you may need Prevnar 18 or the adult Pneumonia vaccine.   After your visit with [place] today you will receive a survey in the mail or online from [employer] regarding your care with [place]. Please take a moment to fill this out. Your feedback is very important to [place] as you can help [place] better understand your patient needs as well as  improve your experience and satisfaction. WE CARE ABOUT YOU!!!   You will be asked to increase the Lasix to 2 daily instead of one daily. You'll be started on an antibiotic which she should take and complete because of the cellulitis in your legs She should wear support hose daily and the should be placed on the legs the first thing with getting up in the morning She should consider going to an assisted living facility and she says she has been thinking about this.

## 2016-09-12 LAB — LIPID PANEL
CHOLESTEROL TOTAL: 119 mg/dL (ref 100–199)
Chol/HDL Ratio: 2.1 ratio (ref 0.0–4.4)
HDL: 57 mg/dL (ref >39)
LDL Calculated: 47 mg/dL (ref 0–99)
Triglycerides: 74 mg/dL (ref 0–149)
VLDL CHOLESTEROL CAL: 15 mg/dL (ref 5–40)

## 2016-09-12 LAB — CBC WITH DIFFERENTIAL/PLATELET
BASOS: 0 %
Basophils Absolute: 0 10*3/uL (ref 0.0–0.2)
EOS (ABSOLUTE): 0 10*3/uL (ref 0.0–0.4)
Eos: 0 %
HEMOGLOBIN: 13.3 g/dL (ref 11.1–15.9)
Hematocrit: 40.6 % (ref 34.0–46.6)
IMMATURE GRANS (ABS): 0 10*3/uL (ref 0.0–0.1)
Immature Granulocytes: 0 %
LYMPHS ABS: 1.5 10*3/uL (ref 0.7–3.1)
LYMPHS: 18 %
MCH: 32.3 pg (ref 26.6–33.0)
MCHC: 32.8 g/dL (ref 31.5–35.7)
MCV: 99 fL — AB (ref 79–97)
MONOCYTES: 7 %
Monocytes Absolute: 0.6 10*3/uL (ref 0.1–0.9)
NEUTROS ABS: 6.6 10*3/uL (ref 1.4–7.0)
Neutrophils: 75 %
Platelets: 272 10*3/uL (ref 150–379)
RBC: 4.12 x10E6/uL (ref 3.77–5.28)
RDW: 13.2 % (ref 12.3–15.4)
WBC: 8.8 10*3/uL (ref 3.4–10.8)

## 2016-09-12 LAB — BMP8+EGFR
BUN / CREAT RATIO: 27 (ref 12–28)
BUN: 16 mg/dL (ref 10–36)
CO2: 26 mmol/L (ref 18–29)
Calcium: 9.2 mg/dL (ref 8.7–10.3)
Chloride: 103 mmol/L (ref 96–106)
Creatinine, Ser: 0.6 mg/dL (ref 0.57–1.00)
GFR, EST AFRICAN AMERICAN: 87 mL/min/{1.73_m2} (ref >59)
GFR, EST NON AFRICAN AMERICAN: 76 mL/min/{1.73_m2} (ref >59)
Glucose: 102 mg/dL — ABNORMAL HIGH (ref 65–99)
Potassium: 4.8 mmol/L (ref 3.5–5.2)
Sodium: 143 mmol/L (ref 134–144)

## 2016-09-12 LAB — HEPATIC FUNCTION PANEL
ALT: 13 IU/L (ref 0–32)
AST: 20 IU/L (ref 0–40)
Albumin: 4.2 g/dL (ref 3.2–4.6)
Alkaline Phosphatase: 80 IU/L (ref 39–117)
BILIRUBIN, DIRECT: 0.23 mg/dL (ref 0.00–0.40)
Bilirubin Total: 0.6 mg/dL (ref 0.0–1.2)
Total Protein: 6.2 g/dL (ref 6.0–8.5)

## 2016-09-22 ENCOUNTER — Encounter (HOSPITAL_COMMUNITY): Payer: Self-pay | Admitting: Emergency Medicine

## 2016-09-22 ENCOUNTER — Emergency Department (HOSPITAL_COMMUNITY)
Admission: EM | Admit: 2016-09-22 | Discharge: 2016-09-22 | Disposition: A | Discharge status: Home / Self Care | Payer: Medicare Other | Attending: Emergency Medicine | Admitting: Emergency Medicine

## 2016-09-22 DIAGNOSIS — Z7982 Long term (current) use of aspirin: Secondary | ICD-10-CM | POA: Insufficient documentation

## 2016-09-22 DIAGNOSIS — Z049 Encounter for examination and observation for unspecified reason: Secondary | ICD-10-CM | POA: Diagnosis not present

## 2016-09-22 DIAGNOSIS — I10 Essential (primary) hypertension: Secondary | ICD-10-CM | POA: Insufficient documentation

## 2016-09-22 DIAGNOSIS — R1110 Vomiting, unspecified: Secondary | ICD-10-CM | POA: Diagnosis present

## 2016-09-22 DIAGNOSIS — R112 Nausea with vomiting, unspecified: Secondary | ICD-10-CM | POA: Insufficient documentation

## 2016-09-22 DIAGNOSIS — R531 Weakness: Secondary | ICD-10-CM | POA: Diagnosis not present

## 2016-09-22 DIAGNOSIS — Z79899 Other long term (current) drug therapy: Secondary | ICD-10-CM | POA: Insufficient documentation

## 2016-09-22 LAB — COMPREHENSIVE METABOLIC PANEL
ALBUMIN: 3.8 g/dL (ref 3.5–5.0)
ALK PHOS: 71 U/L (ref 38–126)
ALT: 14 U/L (ref 14–54)
ANION GAP: 7 (ref 5–15)
AST: 19 U/L (ref 15–41)
BILIRUBIN TOTAL: 0.8 mg/dL (ref 0.3–1.2)
BUN: 16 mg/dL (ref 6–20)
CALCIUM: 8.8 mg/dL — AB (ref 8.9–10.3)
CO2: 28 mmol/L (ref 22–32)
Chloride: 104 mmol/L (ref 101–111)
Creatinine, Ser: 0.49 mg/dL (ref 0.44–1.00)
GLUCOSE: 123 mg/dL — AB (ref 65–99)
Potassium: 4 mmol/L (ref 3.5–5.1)
Sodium: 139 mmol/L (ref 135–145)
TOTAL PROTEIN: 6.6 g/dL (ref 6.5–8.1)

## 2016-09-22 LAB — URINALYSIS, ROUTINE W REFLEX MICROSCOPIC
Bilirubin Urine: NEGATIVE
GLUCOSE, UA: NEGATIVE mg/dL
HGB URINE DIPSTICK: NEGATIVE
Ketones, ur: NEGATIVE mg/dL
NITRITE: NEGATIVE
PROTEIN: NEGATIVE mg/dL
SPECIFIC GRAVITY, URINE: 1.008 (ref 1.005–1.030)
pH: 7 (ref 5.0–8.0)

## 2016-09-22 LAB — CBC
HCT: 40.6 % (ref 36.0–46.0)
HEMOGLOBIN: 13.4 g/dL (ref 12.0–15.0)
MCH: 32.2 pg (ref 26.0–34.0)
MCHC: 33 g/dL (ref 30.0–36.0)
MCV: 97.6 fL (ref 78.0–100.0)
Platelets: 228 10*3/uL (ref 150–400)
RBC: 4.16 MIL/uL (ref 3.87–5.11)
RDW: 12.5 % (ref 11.5–15.5)
WBC: 7.8 10*3/uL (ref 4.0–10.5)

## 2016-09-22 LAB — LIPASE, BLOOD: Lipase: 21 U/L (ref 11–51)

## 2016-09-22 LAB — TROPONIN I: Troponin I: <0.03 ng/mL (ref <0.03)

## 2016-09-22 MED ORDER — ONDANSETRON 4 MG PO TBDP: 4.0000 mg | ORAL_TABLET | Freq: Three times a day (TID) | ORAL | 0 refills | Status: DC | PRN

## 2016-09-22 NOTE — ED Provider Notes (Signed)
[facility] DEPT Provider Note   CSN: [id number] Arrival date & time: 09/22/16  0756     History   Chief Complaint Chief Complaint  Patient presents with  . Emesis    HPI Marissa Burns is a 84 y.o. female with past medical history as outlined below and completion of keflex for a lower extremity cellulitis yesterday presenting with one episode of emesis and a looser than normal stool this am in association with a near fall.  She currently denies any nausea, abdominal pain, denies cp, sob, fever.  She reports she almost fell while back from the bathroom and was afraid of injuring herself, so called ems.  She reports progressively worsened ability to ambulate secondary to severe arthritis in her knees, stating she has been fairly housebound for the past 6 weeks due to decreased mobility.  She has family check on her daily, but otherwise is along.  She and her pcp discussed assisted living at her last ov with him but she has not committed to this currently.  The history is provided by the patient.    Past Medical History:  Diagnosis Date  . Cataract   . Colon polyp   . Diverticulosis   . Edema   . Elevated blood sugar   . Essential hypertension, benign   . High cholesterol   . Osteoarthritis   . Osteoporosis   . Other and unspecified hyperlipidemia     Patient Active Problem List   Diagnosis Date Noted  . BPPV (benign paroxysmal positional vertigo) 12/10/2014  . Visual impairment 01/22/2014  . Chronic constipation 10/08/2013  . Primary osteoarthritis of both knees 10/08/2013  . Hearing deficit 05/20/2013  . Hypertension 10/30/2012  . Hyperlipemia 10/30/2012  . Vitamin D deficiency 10/30/2012  . Arthritis 10/30/2012  . Colon polyp   . Diverticulosis   . Osteoarthritis   . Edema   . Osteoporosis   . Elevated blood sugar   . Cataract     Past Surgical History:  Procedure Laterality Date  . APPENDECTOMY    . CATARACT EXTRACTION    . TONSILLECTOMY      OB  History    No data available       Home Medications    Prior to Admission medications   Medication Sig Start Date End Date Taking? Authorizing Provider  acetaminophen (TYLENOL) 500 MG tablet Take 1,000 mg by mouth daily as needed for moderate pain.    Yes [provider]  amLODipine (NORVASC) 5 MG tablet TAKE 1 TABLET (5 MG TOTAL) BY MOUTH DAILY. 07/17/16  Yes Chipper Herb, MD  aspirin 81 MG EC tablet Take 81 mg by mouth every morning.    Yes [provider]  atorvastatin (LIPITOR) 80 MG tablet TAKE 1 TABLET (80 MG TOTAL) BY MOUTH DAILY. 07/17/16  Yes Chipper Herb, MD  benazepril (LOTENSIN) 20 MG tablet TAKE ONE TABLET BY MOUTH ONE TIME DAILY 07/17/16  Yes Chipper Herb, MD  busPIRone (BUSPAR) 5 MG tablet Take 5 mg by mouth 2 (two) times daily as needed (anxiety).   Yes [provider]  calcium carbonate (TUMS - DOSED IN MG ELEMENTAL CALCIUM) 500 MG chewable tablet Chew 1 tablet by mouth daily.     Yes [provider]  Cholecalciferol (VITAMIN D3) 2000 UNITS TABS Take 2 tablets by mouth daily after breakfast.    Yes [provider]  diclofenac sodium (VOLTAREN) 1 % GEL Apply 2 g topically 4 (four) times daily. Patient taking  differently: Apply 2 g topically daily as needed (pain).  06/05/16  Yes Timmothy Euler, MD  furosemide (LASIX) 20 MG tablet TAKE ONE TABLET BY MOUTH ONE  TIME DAILY 06/30/15  Yes Chipper Herb, MD  Linaclotide [facility]) 145 MCG CAPS capsule Take 1 capsule (145 mcg total) by mouth daily. Patient taking differently: Take 145 mcg by mouth daily as needed (constipation).  06/30/15  Yes Chipper Herb, MD  PAZEO 0.7 % SOLN Place 1 drop into both eyes daily.  07/12/16  Yes [provider]  cephALEXin (KEFLEX) 500 MG capsule Take 1 capsule (500 mg total) by mouth 3 (three) times daily. For 10 days Patient not taking: Reported on 09/22/2016 09/11/16   Chipper Herb, MD  ondansetron (ZOFRAN ODT) 4 MG disintegrating tablet  Take 1 tablet (4 mg total) by mouth every 8 (eight) hours as needed for nausea or vomiting. 09/22/16   Evalee Jefferson, PA-C    Family History History reviewed. No pertinent family history.  Social History Social History  Substance Use Topics  . Smoking status: Never Smoker  . Smokeless tobacco: Never Used  . Alcohol use No     Allergies   Alendronate sodium; Celebrex [celecoxib]; Dilacor [diltiazem hcl]; Evista [raloxifene hydrochloride]; and Propulsid [cisapride]   Review of Systems Review of Systems  Constitutional: Negative for fever.  HENT: Negative for congestion and sore throat.   Eyes: Negative.   Respiratory: Negative for chest tightness and shortness of breath.   Cardiovascular: Negative for chest pain.  Gastrointestinal: Positive for nausea and vomiting. Negative for abdominal pain.  Genitourinary: Negative.   Musculoskeletal: Positive for arthralgias and gait problem. Negative for joint swelling and neck pain.  Skin: Negative.  Negative for rash and wound.  Neurological: Negative for dizziness, weakness, light-headedness, numbness and headaches.  Psychiatric/Behavioral: Negative.      Physical Exam Updated Vital Signs BP (!) 147/58   Pulse 76   Temp 98 F (36.7 C) (Oral)   Resp 16   Ht 5\' 3"  (1.6 m)   Wt 59 kg (130 lb)   SpO2 95%   BMI 23.03 kg/m   Physical Exam  Constitutional: She appears well-developed and well-nourished.  HENT:  Head: Normocephalic and atraumatic.  Mouth/Throat: Oropharynx is clear and moist.  Eyes: Conjunctivae are normal.  Cardiovascular: Normal rate, regular rhythm, normal heart sounds and intact distal pulses.   Pulmonary/Chest: Effort normal and breath sounds normal. She has no wheezes.  Abdominal: Soft. Bowel sounds are normal. She exhibits no distension. There is no tenderness. There is no guarding.  Musculoskeletal: Normal range of motion. She exhibits edema.  Bilateral ankle edema.  No signs of cellulitis.  Neurological:  She is alert.  Skin: Skin is warm and dry.  Psychiatric: She has a normal mood and affect.  Nursing note and vitals reviewed.    ED Treatments / Results  Labs (all labs ordered are listed, but only abnormal results are displayed) Labs Reviewed  COMPREHENSIVE METABOLIC PANEL - Abnormal; Notable for the following:       Result Value   Glucose, Bld 123 (*)    Calcium 8.8 (*)    All other components within normal limits  URINALYSIS, ROUTINE W REFLEX MICROSCOPIC - Abnormal; Notable for the following:    Color, Urine STRAW (*)    Leukocytes, UA SMALL (*)    Bacteria, UA RARE (*)    Squamous Epithelial / LPF 0-5 (*)    Crystals PRESENT (*)    All other  components within normal limits  LIPASE, BLOOD  CBC  TROPONIN I    EKG  EKG Interpretation  Date/Time:  Friday September 22 2016 08:01:10 EDT Ventricular Rate:  83 PR Interval:    QRS Duration: 86 QT Interval:  378 QTC Calculation: 445 R Axis:   43 Text Interpretation:  Sinus rhythm Low voltage, extremity leads No STEMI.  Confirmed by Nanda Quinton [phone]) on 09/22/2016 8:10:58 AM       Radiology No results found.  Procedures Procedures (including critical care time)  Medications Ordered in ED Medications - No data to display   Initial Impression / Assessment and Plan / ED Course  I have reviewed the triage vital signs and the nursing notes.  Pertinent labs & imaging results that were available during my care of the patient were reviewed by me and considered in my medical decision making (see chart for details).     Labs reviewed, pt in ed without continued n/v/d.  Sister and next door neighbor now at the bedside.  Offered home health consult for PT/OT, consideration of home health aide, etc.  Family states she already has a Education officer, museum that comes in monthly and family and neighbor take care of her, has life line, no further needs at this time.   Advised pt to f/u with pcp if she decides she would like to consider  assisted living.  Pt aware.  She will be prescribed zofran if she develops return of n/v.  Prn f/u anticipated.  Pt seen by Dr. Laverta Baltimore prior to dc home.  Final Clinical Impressions(s) / ED Diagnoses   Final diagnoses:  Non-intractable vomiting with nausea, unspecified vomiting type    New Prescriptions New Prescriptions   ONDANSETRON (ZOFRAN ODT) 4 MG DISINTEGRATING TABLET    Take 1 tablet (4 mg total) by mouth every 8 (eight) hours as needed for nausea or vomiting.     Evalee Jefferson, PA-C 09/22/16 2105    Margette Fast, MD 09/24/16 2129

## 2016-09-22 NOTE — ED Notes (Signed)
ED Provider at bedside. 

## 2016-09-22 NOTE — ED Triage Notes (Addendum)
PT presents to the ED today with [facility] for N/V/D that started yesterday. PT states recent antibiotic completion for cellulitis. PT alert and oriented. EMS gave 4mg  of Zofran PO prior to arrival and CBG 103.

## 2016-09-22 NOTE — Care Management Note (Signed)
Case Management Note  Patient Details  Name: [name] MRN: [id number] Date of Birth: 08-May-1914    Expected Discharge Date:       09/22/2016           Expected Discharge Plan:  Home/Self Care  In-House Referral:     Discharge planning Services  CM Consult  Post Acute Care Choice:    Choice offered to:     DME Arranged:    DME Agency:     HH Arranged:  Patient Refused [place] Agency:     Status of Service:  Completed, signed off  If discussed at [employer] of Stay Meetings, dates discussed:    Additional Comments: Patient in ED. CM consulted for [facility] needs. Per neighbor and sister at beside, patient is independent and lives alone. Neighbor checks on patient multiple times a day. Patient has a cane and RW PTA to use with if needed. Patient has Medicare and Medicaid. She has a Medicaid CSW. Family has contacts. PCP has discussed ALF with patient, she is not interested at this time. She does have a life alert necklace. She and family are aware of how to pursue ALF if patient becomes interested. No CM needs.  Deigo Alonso, Chauncey Reading, RN 09/22/2016, 12:10 PM

## 2016-09-27 ENCOUNTER — Encounter: Payer: Self-pay | Admitting: Family Medicine

## 2016-09-27 ENCOUNTER — Ambulatory Visit (INDEPENDENT_AMBULATORY_CARE_PROVIDER_SITE_OTHER): Payer: Medicare Other | Admitting: Family Medicine

## 2016-09-27 VITALS — BP 148/72 | HR 70 | Temp 98.0°F | Ht 63.0 in | Wt 129.0 lb

## 2016-09-27 DIAGNOSIS — R110 Nausea: Secondary | ICD-10-CM | POA: Diagnosis not present

## 2016-09-27 DIAGNOSIS — R531 Weakness: Secondary | ICD-10-CM

## 2016-09-27 DIAGNOSIS — R2681 Unsteadiness on feet: Secondary | ICD-10-CM | POA: Diagnosis not present

## 2016-09-27 DIAGNOSIS — M25561 Pain in right knee: Secondary | ICD-10-CM | POA: Diagnosis not present

## 2016-09-27 DIAGNOSIS — M25562 Pain in left knee: Secondary | ICD-10-CM | POA: Diagnosis not present

## 2016-09-27 DIAGNOSIS — L03116 Cellulitis of left lower limb: Secondary | ICD-10-CM | POA: Diagnosis not present

## 2016-09-27 NOTE — Progress Notes (Signed)
Subjective:    Patient ID: [name], female    DOB: 11-11-1914, 83 y.o.   MRN: [id number]  HPI Patient here today for 2 week follow up on left lower leg cellulitis. She has completed the Keflex.Since the last visit, the patient ended up in the hospital ER cause of intractable nausea and vomiting. By the time she got to the ER she was feeling better and she never had take anything for nausea. She is here today primarily to follow up on BiPAP plus to follow-up on the cellulitis in the leg. The patient has finished her Keflex and she stop wearing support hose when she finished the Keflex.     Patient Active Problem List   Diagnosis Date Noted  . BPPV (benign paroxysmal positional vertigo) 12/10/2014  . Visual impairment 01/22/2014  . Chronic constipation 10/08/2013  . Primary osteoarthritis of both knees 10/08/2013  . Hearing deficit 05/20/2013  . Hypertension 10/30/2012  . Hyperlipemia 10/30/2012  . Vitamin D deficiency 10/30/2012  . Arthritis 10/30/2012  . Colon polyp   . Diverticulosis   . Osteoarthritis   . Edema   . Osteoporosis   . Elevated blood sugar   . Cataract    Outpatient Encounter Prescriptions as of 09/27/2016  Medication Sig  . acetaminophen (TYLENOL) 500 MG tablet Take 1,000 mg by mouth daily as needed for moderate pain.   Marland Kitchen amLODipine (NORVASC) 5 MG tablet TAKE 1 TABLET (5 MG TOTAL) BY MOUTH DAILY.  Marland Kitchen aspirin 81 MG EC tablet Take 81 mg by mouth every morning.   Marland Kitchen atorvastatin (LIPITOR) 80 MG tablet TAKE 1 TABLET (80 MG TOTAL) BY MOUTH DAILY.  . benazepril (LOTENSIN) 20 MG tablet TAKE ONE TABLET BY MOUTH ONE TIME DAILY  . busPIRone (BUSPAR) 5 MG tablet Take 5 mg by mouth 2 (two) times daily as needed (anxiety).  . calcium carbonate (TUMS - DOSED IN MG ELEMENTAL CALCIUM) 500 MG chewable tablet Chew 1 tablet by mouth daily.    . Cholecalciferol (VITAMIN D3) 2000 UNITS TABS Take 2 tablets by mouth daily after breakfast.   . diclofenac sodium (VOLTAREN) 1 %  GEL Apply 2 g topically 4 (four) times daily. (Patient taking differently: Apply 2 g topically daily as needed (pain). )  . furosemide (LASIX) 20 MG tablet TAKE ONE TABLET BY MOUTH ONE  TIME DAILY  . Linaclotide (LINZESS) 145 MCG CAPS capsule Take 1 capsule (145 mcg total) by mouth daily. (Patient taking differently: Take 145 mcg by mouth daily as needed (constipation). )  . PAZEO 0.7 % SOLN Place 1 drop into both eyes daily.   . [DISCONTINUED] cephALEXin (KEFLEX) 500 MG capsule Take 1 capsule (500 mg total) by mouth 3 (three) times daily. For 10 days  . ondansetron (ZOFRAN ODT) 4 MG disintegrating tablet Take 1 tablet (4 mg total) by mouth every 8 (eight) hours as needed for nausea or vomiting. (Patient not taking: Reported on 09/27/2016)   No facility-administered encounter medications on file as of 09/27/2016.       Review of Systems  Constitutional: Negative.   HENT: Negative.   Eyes: Negative.   Respiratory: Negative.   Cardiovascular: Negative.   Gastrointestinal: Negative.   Endocrine: Negative.   Genitourinary: Negative.   Musculoskeletal: Negative.   Skin: Negative.        Recent left lower leg cellulitis and edema  Allergic/Immunologic: Negative.   Neurological: Negative.   Hematological: Negative.   Psychiatric/Behavioral: Negative.        Objective:  Physical Exam  Constitutional: She is oriented to person, place, and time.  The patient is elderly and hard of hearing. She is pretty determined that she is not going to go to an assisted living facility.  HENT:  Head: Normocephalic and atraumatic.  Eyes: Conjunctivae and EOM are normal. Pupils are equal, round, and reactive to light. Right eye exhibits no discharge. Left eye exhibits no discharge. No scleral icterus.  Neck: Normal range of motion.  Cardiovascular: Normal rate, regular rhythm and normal heart sounds.   No murmur heard. Pulmonary/Chest: Effort normal and breath sounds normal. No respiratory distress. She  has no wheezes. She has no rales.  Abdominal: Soft. She exhibits no mass. There is no tenderness. There is no rebound and no guarding.  Musculoskeletal: She exhibits edema.  There is some pedal edema today but the redness of the legs appeared to be improved. The patient has stiffness in both knees secondary to end-stage arthritis. She comes to the office using only a cane but I encouraged her to use her walker. The other sister says that she will not do this.  Neurological: She is alert and oriented to person, place, and time.  Skin: Skin is warm and dry. No rash noted. There is erythema.  There is only minimal erythema if any and the edema in the lower extremity appears to be improved except for the pedal edema. She does not have any support hose on today.  Psychiatric: She has a normal mood and affect. Her behavior is normal. Judgment and thought content normal.  Nursing note and vitals reviewed.  BP (!) 148/72 (BP Location: Right Arm)   Pulse 70   Temp 98 F (36.7 C) (Oral)   Ht '5\' 3"'  (1.6 m)   Wt 129 lb (58.5 kg)   BMI 22.85 kg/m         Assessment & Plan:  1. Nausea -This is resolved. - CBC with Differential/Platelet - BMP8+EGFR  2. Cellulitis of left lower extremity -She has finished the antibiotic and this is much improved.  3. Gait instability -[place] is an ongoing problem and a new prescription will be written for a more narrow walker that she can use around the house more effectively - For home use only DME Walker  4. General weakness - For home use only DME Walker  5. Pain in both knees, unspecified chronicity -She has end-stage arthritis. In both knees. - For home use only DME Walker  Patient Instructions  Please consider again going to an assisted living facility Please discuss this with your grandson and your sister I would highly recommend this because of the needs that you have an increase risk of falling and injuries because of your age and arthritis We'll  give you a prescription for a new walker that is more narrow that you can use at home or at the assisted living facility Please put your support hose on the first thing with arising in the morning and continue with the current dose of Lasix  Arrie Senate MD

## 2016-09-27 NOTE — Patient Instructions (Signed)
Please consider again going to an assisted living facility Please discuss this with your grandson and your sister I would highly recommend this because of the needs that you have an increase risk of falling and injuries because of your age and arthritis We'll give you a prescription for a new walker that is more narrow that you can use at home or at the assisted living facility Please put your support hose on the first thing with arising in the morning and continue with the current dose of Lasix

## 2016-09-28 LAB — CBC WITH DIFFERENTIAL/PLATELET
BASOS ABS: 0 10*3/uL (ref 0.0–0.2)
Basos: 0 %
EOS (ABSOLUTE): 0 10*3/uL (ref 0.0–0.4)
Eos: 1 %
Hematocrit: 37.1 % (ref 34.0–46.6)
Hemoglobin: 12.7 g/dL (ref 11.1–15.9)
IMMATURE GRANS (ABS): 0 10*3/uL (ref 0.0–0.1)
IMMATURE GRANULOCYTES: 0 %
LYMPHS: 24 %
Lymphocytes Absolute: 1.5 10*3/uL (ref 0.7–3.1)
MCH: 32.5 pg (ref 26.6–33.0)
MCHC: 34.2 g/dL (ref 31.5–35.7)
MCV: 95 fL (ref 79–97)
MONOCYTES: 8 %
Monocytes Absolute: 0.5 10*3/uL (ref 0.1–0.9)
NEUTROS PCT: 67 %
Neutrophils Absolute: 4.2 10*3/uL (ref 1.4–7.0)
PLATELETS: 263 10*3/uL (ref 150–379)
RBC: 3.91 x10E6/uL (ref 3.77–5.28)
RDW: 13 % (ref 12.3–15.4)
WBC: 6.3 10*3/uL (ref 3.4–10.8)

## 2016-09-28 LAB — BMP8+EGFR
BUN / CREAT RATIO: 30 — AB (ref 12–28)
BUN: 17 mg/dL (ref 10–36)
CALCIUM: 8.8 mg/dL (ref 8.7–10.3)
CHLORIDE: 105 mmol/L (ref 96–106)
CO2: 23 mmol/L (ref 20–29)
Creatinine, Ser: 0.56 mg/dL — ABNORMAL LOW (ref 0.57–1.00)
GFR calc Af Amer: 89 mL/min/{1.73_m2} (ref >59)
GFR calc non Af Amer: 77 mL/min/{1.73_m2} (ref >59)
Glucose: 92 mg/dL (ref 65–99)
Potassium: 4 mmol/L (ref 3.5–5.2)
Sodium: 143 mmol/L (ref 134–144)

## 2016-09-28 NOTE — Progress Notes (Signed)
Patient aware.

## 2016-10-02 ENCOUNTER — Telehealth: Payer: Self-pay | Admitting: Family Medicine

## 2016-10-02 NOTE — Telephone Encounter (Signed)
Sister called - she is aware that walker without seat was ordered and that's what Dr Laurance Flatten wanted.

## 2016-10-09 ENCOUNTER — Telehealth: Payer: Self-pay | Admitting: Family Medicine

## 2016-10-09 NOTE — Telephone Encounter (Signed)
NA -jhb 10/09/16

## 2016-10-11 ENCOUNTER — Encounter (HOSPITAL_COMMUNITY): Payer: Self-pay | Admitting: Emergency Medicine

## 2016-10-11 ENCOUNTER — Emergency Department (HOSPITAL_COMMUNITY)
Admission: EM | Admit: 2016-10-11 | Discharge: 2016-10-11 | Disposition: A | Discharge status: Home / Self Care | Payer: Medicare Other | Attending: Emergency Medicine | Admitting: Emergency Medicine

## 2016-10-11 DIAGNOSIS — R609 Edema, unspecified: Secondary | ICD-10-CM

## 2016-10-11 DIAGNOSIS — L03115 Cellulitis of right lower limb: Secondary | ICD-10-CM | POA: Diagnosis not present

## 2016-10-11 DIAGNOSIS — I10 Essential (primary) hypertension: Secondary | ICD-10-CM | POA: Diagnosis not present

## 2016-10-11 DIAGNOSIS — R2243 Localized swelling, mass and lump, lower limb, bilateral: Secondary | ICD-10-CM | POA: Diagnosis present

## 2016-10-11 DIAGNOSIS — Z79899 Other long term (current) drug therapy: Secondary | ICD-10-CM | POA: Insufficient documentation

## 2016-10-11 DIAGNOSIS — L03116 Cellulitis of left lower limb: Secondary | ICD-10-CM | POA: Insufficient documentation

## 2016-10-11 DIAGNOSIS — Z049 Encounter for examination and observation for unspecified reason: Secondary | ICD-10-CM | POA: Diagnosis not present

## 2016-10-11 DIAGNOSIS — R600 Localized edema: Secondary | ICD-10-CM | POA: Diagnosis not present

## 2016-10-11 LAB — CBC WITH DIFFERENTIAL/PLATELET
BASOS PCT: 0 %
Basophils Absolute: 0 10*3/uL (ref 0.0–0.1)
EOS PCT: 2 %
Eosinophils Absolute: 0.1 10*3/uL (ref 0.0–0.7)
HEMATOCRIT: 35.6 % — AB (ref 36.0–46.0)
Hemoglobin: 11.9 g/dL — ABNORMAL LOW (ref 12.0–15.0)
Lymphocytes Relative: 25 %
Lymphs Abs: 2 10*3/uL (ref 0.7–4.0)
MCH: 32.6 pg (ref 26.0–34.0)
MCHC: 33.4 g/dL (ref 30.0–36.0)
MCV: 97.5 fL (ref 78.0–100.0)
MONO ABS: 0.6 10*3/uL (ref 0.1–1.0)
MONOS PCT: 8 %
NEUTROS ABS: 5.4 10*3/uL (ref 1.7–7.7)
Neutrophils Relative %: 65 %
PLATELETS: 297 10*3/uL (ref 150–400)
RBC: 3.65 MIL/uL — ABNORMAL LOW (ref 3.87–5.11)
RDW: 12.3 % (ref 11.5–15.5)
WBC: 8.2 10*3/uL (ref 4.0–10.5)

## 2016-10-11 LAB — BASIC METABOLIC PANEL
ANION GAP: 8 (ref 5–15)
BUN: 22 mg/dL — AB (ref 6–20)
CALCIUM: 8.1 mg/dL — AB (ref 8.9–10.3)
CO2: 27 mmol/L (ref 22–32)
Chloride: 109 mmol/L (ref 101–111)
Creatinine, Ser: 0.62 mg/dL (ref 0.44–1.00)
GFR calc Af Amer: >60 mL/min (ref >60)
GLUCOSE: 112 mg/dL — AB (ref 65–99)
Potassium: 3.7 mmol/L (ref 3.5–5.1)
Sodium: 144 mmol/L (ref 135–145)

## 2016-10-11 MED ORDER — AMOXICILLIN-POT CLAVULANATE 875-125 MG PO TABS
1.0000 | ORAL_TABLET | Freq: Once | ORAL | Status: AC
  Administered 2016-10-11: 1 via ORAL
  Filled 2016-10-11: qty 1

## 2016-10-11 MED ORDER — AMOXICILLIN-POT CLAVULANATE 875-125 MG PO TABS: 1.0000 | ORAL_TABLET | Freq: Two times a day (BID) | ORAL | 0 refills | Status: DC

## 2016-10-11 MED ORDER — FUROSEMIDE 20 MG PO TABS: 20.0000 mg | ORAL_TABLET | Freq: Two times a day (BID) | ORAL | 3 refills | Status: DC

## 2016-10-11 NOTE — ED Provider Notes (Signed)
[facility] DEPT Provider Note   CSN: [id number] Arrival date & time: 10/11/16  1658     History   Chief Complaint Chief Complaint  Patient presents with  . Leg Swelling    HPI Marissa Burns is a 83 y.o. female.  Patient presents for evaluation of her leg pain, swelling and redness.  She reports this constellation of problems has been present for 2 weeks.  She was seen at her PCP office, on 09/27/16 for ER follow-up.  At that time the doctor noted that her lower extremity edema, had resolved and indicators for cellulitis in the lower legs, also were absent.  She had been treated for cellulitis, at an office visit, on 09/11/16, with Keflex.  She was in the ED on 09/22/16 with nonspecific nausea and vomiting.  Patient has a chronic gait disorder, uses a cane to walk, and lives alone.  Previously, on the prior ED visit, she stated that she did not need home health care.  She has resisted going to assisted living, which her physician has advised.  She denies fever, chills, shortness of breath, cough, weakness or dizziness.  There are no other known modifying factors.  HPI  Past Medical History:  Diagnosis Date  . Cataract   . Colon polyp   . Diverticulosis   . Edema   . Elevated blood sugar   . Essential hypertension, benign   . High cholesterol   . Osteoarthritis   . Osteoporosis   . Other and unspecified hyperlipidemia     Patient Active Problem List   Diagnosis Date Noted  . BPPV (benign paroxysmal positional vertigo) 12/10/2014  . Visual impairment 01/22/2014  . Chronic constipation 10/08/2013  . Primary osteoarthritis of both knees 10/08/2013  . Hearing deficit 05/20/2013  . Hypertension 10/30/2012  . Hyperlipemia 10/30/2012  . Vitamin D deficiency 10/30/2012  . Arthritis 10/30/2012  . Colon polyp   . Diverticulosis   . Osteoarthritis   . Edema   . Osteoporosis   . Elevated blood sugar   . Cataract     Past Surgical History:  Procedure Laterality Date  .  APPENDECTOMY    . CATARACT EXTRACTION    . TONSILLECTOMY      OB History    No data available       Home Medications    Prior to Admission medications   Medication Sig Start Date End Date Taking? Authorizing Provider  acetaminophen (TYLENOL) 500 MG tablet Take 1,000 mg by mouth daily as needed for moderate pain.     [provider]  amLODipine (NORVASC) 5 MG tablet TAKE 1 TABLET (5 MG TOTAL) BY MOUTH DAILY. 07/17/16   Chipper Herb, MD  amoxicillin-clavulanate (AUGMENTIN) 875-125 MG tablet Take 1 tablet by mouth every 12 (twelve) hours. 10/11/16   Daleen Bo, MD  aspirin 81 MG EC tablet Take 81 mg by mouth every morning.     [provider]  atorvastatin (LIPITOR) 80 MG tablet TAKE 1 TABLET (80 MG TOTAL) BY MOUTH DAILY. 07/17/16   Chipper Herb, MD  benazepril (LOTENSIN) 20 MG tablet TAKE ONE TABLET BY MOUTH ONE TIME DAILY 07/17/16   Chipper Herb, MD  busPIRone (BUSPAR) 5 MG tablet Take 5 mg by mouth 2 (two) times daily as needed (anxiety).    [provider]  calcium carbonate (TUMS - DOSED IN MG ELEMENTAL CALCIUM) 500 MG chewable tablet Chew 1 tablet by mouth daily.      [provider]  Cholecalciferol (  VITAMIN D3) 2000 UNITS TABS Take 2 tablets by mouth daily after breakfast.     [provider]  diclofenac sodium (VOLTAREN) 1 % GEL Apply 2 g topically 4 (four) times daily. Patient taking differently: Apply 2 g topically daily as needed (pain).  06/05/16   Timmothy Euler, MD  furosemide (LASIX) 20 MG tablet Take 1 tablet (20 mg total) by mouth 2 (two) times daily. TAKE ONE TABLET BY MOUTH ONE  TIME DAILY 10/11/16   Daleen Bo, MD  Linaclotide [facility]) 145 MCG CAPS capsule Take 1 capsule (145 mcg total) by mouth daily. Patient taking differently: Take 145 mcg by mouth daily as needed (constipation).  06/30/15   Chipper Herb, MD  ondansetron (ZOFRAN ODT) 4 MG disintegrating tablet Take 1 tablet (4 mg total) by mouth every 8  (eight) hours as needed for nausea or vomiting. Patient not taking: Reported on 09/27/2016 09/22/16   Evalee Jefferson, PA-C  PAZEO 0.7 % SOLN Place 1 drop into both eyes daily.  07/12/16   [provider]    Family History History reviewed. No pertinent family history.  Social History Social History  Substance Use Topics  . Smoking status: Never Smoker  . Smokeless tobacco: Never Used  . Alcohol use No     Allergies   Alendronate sodium; Celebrex [celecoxib]; Dilacor [diltiazem hcl]; Evista [raloxifene hydrochloride]; and Propulsid [cisapride]   Review of Systems Review of Systems  All other systems reviewed and are negative.    Physical Exam Updated Vital Signs BP (!) 136/58   Pulse 70   Temp 97.7 F (36.5 C) (Oral)   Resp 17   Ht 5\' 3"  (1.6 m)   Wt 58.5 kg (129 lb)   SpO2 96%   BMI 22.85 kg/m   Physical Exam  Constitutional: She is oriented to person, place, and time. She appears well-developed and well-nourished.  HENT:  Head: Normocephalic and atraumatic.  Eyes: Conjunctivae and EOM are normal. Pupils are equal, round, and reactive to light.  Neck: Normal range of motion and phonation normal. Neck supple.  Cardiovascular: Normal rate and regular rhythm.   Pulmonary/Chest: Effort normal and breath sounds normal. She exhibits no tenderness.  Abdominal: Soft. She exhibits no distension. There is no tenderness. There is no guarding.  Musculoskeletal: Normal range of motion. She exhibits edema (3+ to the knees bilaterally.) and tenderness (Bilateral lower legs, mild). She exhibits no deformity.  Neurological: She is alert and oriented to person, place, and time. She exhibits normal muscle tone.  No dysarthria aphasia or nystagmus.  Moderately hard of hearing.  Skin: Skin is warm and dry.  Bilateral lower leg rash, depicted in photograph, and characterized by redness, peeling skin, and areas of excoriation.  No vesicles or petechiae are associated.  Psychiatric:  She has a normal mood and affect. Her behavior is normal. Judgment and thought content normal.  Nursing note and vitals reviewed.      ED Treatments / Results  Labs (all labs ordered are listed, but only abnormal results are displayed) Labs Reviewed  BASIC METABOLIC PANEL - Abnormal; Notable for the following:       Result Value   Glucose, Bld 112 (*)    BUN 22 (*)    Calcium 8.1 (*)    All other components within normal limits  CBC WITH DIFFERENTIAL/PLATELET - Abnormal; Notable for the following:    RBC 3.65 (*)    Hemoglobin 11.9 (*)    HCT 35.6 (*)  All other components within normal limits    EKG  EKG Interpretation  Date/Time:  Wednesday October 11 2016 17:06:42 EDT Ventricular Rate:  93 PR Interval:    QRS Duration: 94 QT Interval:  383 QTC Calculation: 477 R Axis:   58 Text Interpretation:  Sinus or ectopic atrial rhythm Supraventricular bigeminy Low voltage, extremity and precordial leads since last tracing no significant change Confirmed by Daleen Bo [phone]) on 10/11/2016 7:37:50 PM       Radiology No results found.  Procedures Procedures (including critical care time)  Medications Ordered in ED Medications  amoxicillin-clavulanate (AUGMENTIN) 875-125 MG per tablet 1 tablet (1 tablet Oral Given 10/11/16 1830)     Initial Impression / Assessment and Plan / ED Course  I have reviewed the triage vital signs and the nursing notes.  Pertinent labs & imaging results that were available during my care of the patient were reviewed by me and considered in my medical decision making (see chart for details).      Patient Vitals for the past 24 hrs:  BP Temp Temp src Pulse Resp SpO2 Height Weight  10/11/16 1930 (!) 136/58 - - - 17 - - -  10/11/16 1925 136/66 - - 70 (!) 24 96 % - -  10/11/16 1830 - - - 62 14 94 % - -  10/11/16 1800 - - - 66 (!) 24 93 % - -  10/11/16 1730 - - - 68 17 94 % - -  10/11/16 1715 - - - 83 16 96 % - -  10/11/16 1705 (!) 158/61  97.7 F (36.5 C) Oral 89 18 97 % - -  10/11/16 1701 - - - - - - 5\' 3"  (1.6 m) 58.5 kg (129 lb)    At discharge- reevaluation with update and discussion. After initial assessment and treatment, an updated evaluation reveals she remains comfortable has no further complaints.  Findings discussed with patient and family members. Donesha Wallander L    Final Clinical Impressions(s) / ED Diagnoses   Final diagnoses:  Peripheral edema  Bilateral lower leg cellulitis   Right lower leg edema, with cellulitis.  Doubt sepsis or metabolic instability.  Patient is debilitated elderly female, who will benefit from home health services.  She lives alone.  She has a friend who lives next door who helps her.  Her sister is also involved.  Nursing Notes Reviewed/ Care Coordinated Applicable Imaging Reviewed Interpretation of Laboratory Data incorporated into ED treatment  The patient appears reasonably screened and/or stabilized for discharge and I doubt any other medical condition or other [facility] requiring further screening, evaluation, or treatment in the ED at this time prior to discharge.  Plan: Home Medications-continue usual medications, increase Lasix to twice daily, for 1 week then resume 20 mg a day.; Home Treatments-rest, wound care, leg elevation; return here if the recommended treatment, does not improve the symptoms; Recommended follow up-to be checkup 1 week and as needed.   New Prescriptions Discharge Medication List as of 10/11/2016  7:51 PM    START taking these medications   Details  amoxicillin-clavulanate (AUGMENTIN) 875-125 MG tablet Take 1 tablet by mouth every 12 (twelve) hours., Starting Wed 10/11/2016, Print         Daleen Bo, MD 10/11/16 [phone]

## 2016-10-11 NOTE — ED Triage Notes (Signed)
Per EMS, pt brought in today for BLE leg swelling. Pt reports left leg swelling and intermittent pain for last several weeks. Pt reports RLE began swelling on Sunday and reports "my skin has been weeping." moderate swelling and redness noted to BLE. Pt denies any pain at this time.

## 2016-10-11 NOTE — Discharge Instructions (Signed)
Lower leg cellulitis has returned.  To help treat this, continue to elevate the legs above the heart is much as possible.  Additional treatments include warm compresses several times a day and daily cleansing with soap and water.  We are going to increase her dose of Lasix for 1 week, to improve the urine output.   Make sure that you are getting plenty of rest, and drinking a lot of fluids.  Also try to eat 3 good well-balanced meals each day.  We are going to ask that home health nurse comes out to see you, and also that you be evaluated for additional home health services.  You can also call your social worker, to see if they can help with that process.  Return here, if needed, for problems.

## 2016-10-12 ENCOUNTER — Telehealth: Payer: Self-pay

## 2016-10-12 NOTE — Telephone Encounter (Signed)
Marissa Burns, one of the case workers for [employer] called about Marissa Burns.  She was in the ER recently for edema and cellulitis.  Marissa Burns had spoken to Marissa sister, [name].  The social worker feels that Marissa Burns needs some in home care and assistance with ADL's, possibly meals.  She said her sister sounds like she has her hands full and is doing all she can do but she is only able to do so much because of her age.  The social worker is going to see Marissa Burns this afternoon and is going to talk with her about some of the services that can be put in place for her.  She would like for you to go ahead and start the process to see what she may need.

## 2016-10-17 ENCOUNTER — Other Ambulatory Visit: Payer: Self-pay | Admitting: Family Medicine

## 2016-10-17 NOTE — Telephone Encounter (Signed)
Has appt 10/24/16 for hosp f/u and face to face

## 2016-10-20 ENCOUNTER — Ambulatory Visit: Payer: Medicare Other | Admitting: Family Medicine

## 2016-10-22 ENCOUNTER — Other Ambulatory Visit: Payer: Self-pay | Admitting: Family Medicine

## 2016-10-24 ENCOUNTER — Ambulatory Visit (INDEPENDENT_AMBULATORY_CARE_PROVIDER_SITE_OTHER): Payer: Medicare Other | Admitting: Family Medicine

## 2016-10-24 VITALS — BP 130/56 | HR 67 | Temp 97.1°F | Ht 63.0 in

## 2016-10-24 DIAGNOSIS — R601 Generalized edema: Secondary | ICD-10-CM

## 2016-10-24 DIAGNOSIS — H9193 Unspecified hearing loss, bilateral: Secondary | ICD-10-CM | POA: Diagnosis not present

## 2016-10-24 DIAGNOSIS — L03116 Cellulitis of left lower limb: Secondary | ICD-10-CM

## 2016-10-24 NOTE — Progress Notes (Signed)
Subjective:  Patient ID: Marissa Burns, female    DOB: Aug 23, 1914  Age: 83 y.o. MRN: [id number]  CC: face to face (pt here today for Face to Face to get Home Health as well as following up from ED visit at [facility] for leg edema.)   HPI Marissa Burns presents for The first patient says she does want home health and she said she does not want it. She is having a lot of tenderness in the right calf over the lateral section there is a sore in the location. She does have some persistent edema. The patient is advanced in age and has some hearing trouble and also some memory deficit. Repeating herself multiple times during the conversation.  Depression screen [facility] 2/9 10/24/2016 09/27/2016 09/11/2016  Decreased Interest 1 1 2   Down, Depressed, Hopeless 0 0 3  PHQ - 2 Score 1 1 5   Altered sleeping - - 0  Tired, decreased energy - - 2  Change in appetite - - 0  Feeling bad or failure about yourself  - - 2  Trouble concentrating - - 1  Moving slowly or fidgety/restless - - 3  Suicidal thoughts - - 1  PHQ-9 Score - - 14  Difficult doing work/chores - - Somewhat difficult    History Marissa Burns has a past medical history of Cataract; Colon polyp; Diverticulosis; Edema; Elevated blood sugar; Essential hypertension, benign; High cholesterol; Osteoarthritis; Osteoporosis; and Other and unspecified hyperlipidemia.   She has a past surgical history that includes Cataract extraction; Appendectomy; and Tonsillectomy.   Her family history is not on file.She reports that she has never smoked. She has never used smokeless tobacco. She reports that she does not drink alcohol or use drugs.    ROS Review of Systems  Constitutional: Negative for activity change, appetite change and fever.  HENT: Negative for congestion, rhinorrhea and sore throat.   Eyes: Negative for visual disturbance.  Respiratory: Negative for cough and shortness of breath.   Cardiovascular: Negative for chest pain and palpitations.    Gastrointestinal: Negative for abdominal pain, diarrhea and nausea.  Genitourinary: Negative for dysuria.  Musculoskeletal: Negative for arthralgias and myalgias.    Objective:  BP (!) 130/56   Pulse 67   Temp (!) 97.1 F (36.2 C) (Oral)   Ht 5\' 3"  (1.6 m)   BP Readings from Last 3 Encounters:  10/24/16 (!) 130/56  10/11/16 (!) 136/58  09/27/16 (!) 148/72    Wt Readings from Last 3 Encounters:  10/11/16 129 lb (58.5 kg)  09/27/16 129 lb (58.5 kg)  09/22/16 130 lb (59 kg)     Physical Exam  Constitutional: She is oriented to person, place, and time. She appears well-developed and well-nourished. No distress.  HENT:  Head: Normocephalic and atraumatic.  Right Ear: External ear normal.  Left Ear: External ear normal.  Nose: Nose normal.  Mouth/Throat: Oropharynx is clear and moist.  Eyes: Pupils are equal, round, and reactive to light. Conjunctivae and EOM are normal.  Neck: Normal range of motion. Neck supple. No thyromegaly present.  Cardiovascular: Normal rate, regular rhythm and normal heart sounds.   No murmur heard. Pulmonary/Chest: Effort normal and breath sounds normal. No respiratory distress. She has no wheezes. She has no rales.  Abdominal: Soft. Bowel sounds are normal. She exhibits no distension. There is no tenderness.  Musculoskeletal: She exhibits edema.  Lymphadenopathy:    She has no cervical adenopathy.  Neurological: She is alert and oriented to person, place, and  time. She has normal reflexes.  Skin: Skin is warm and dry. There is erythema (left lower extremity).  Psychiatric: She has a normal mood and affect. Her behavior is normal. Judgment and thought content normal.      Assessment & Plan:   Marissa Burns was seen today for face to face.  Diagnoses and all orders for this visit:  Generalized edema  Hearing problem of both ears  Cellulitis of left lower extremity    I am having Marissa Burns maintain her aspirin, calcium carbonate, Vitamin  D3, acetaminophen, diclofenac sodium, PAZEO, busPIRone, ondansetron, amoxicillin-clavulanate, furosemide, atorvastatin, benazepril, amLODipine, and LINZESS.  Allergies as of 10/24/2016      Reactions   Alendronate Sodium Other (See Comments)   unknown   Celebrex [celecoxib] Other (See Comments)   unknown   Dilacor [diltiazem Hcl] Other (See Comments)   unknown   Evista [raloxifene Hydrochloride] Other (See Comments)   unknown   Propulsid [cisapride] Other (See Comments)   unknown      Medication List       Accurate as of 10/24/16 11:59 PM. Always use your most recent med list.          acetaminophen 500 MG tablet Commonly known as:  TYLENOL Take 1,000 mg by mouth daily as needed for moderate pain.   amLODipine 5 MG tablet Commonly known as:  NORVASC TAKE 1 TABLET (5 MG TOTAL) BY MOUTH DAILY.   amoxicillin-clavulanate 875-125 MG tablet Commonly known as:  AUGMENTIN Take 1 tablet by mouth every 12 (twelve) hours.   aspirin 81 MG EC tablet Take 81 mg by mouth every morning.   atorvastatin 80 MG tablet Commonly known as:  LIPITOR TAKE 1 TABLET (80 MG TOTAL) BY MOUTH DAILY.   benazepril 20 MG tablet Commonly known as:  LOTENSIN TAKE ONE TABLET BY MOUTH ONE TIME DAILY   busPIRone 5 MG tablet Commonly known as:  BUSPAR Take 5 mg by mouth 2 (two) times daily as needed (anxiety).   calcium carbonate 500 MG chewable tablet Commonly known as:  TUMS - dosed in mg elemental calcium Chew 1 tablet by mouth daily.   diclofenac sodium 1 % Gel Commonly known as:  VOLTAREN Apply 2 g topically 4 (four) times daily.   furosemide 20 MG tablet Commonly known as:  LASIX Take 1 tablet (20 mg total) by mouth 2 (two) times daily. TAKE ONE TABLET BY MOUTH ONE  TIME DAILY   LINZESS 145 MCG Caps capsule Generic drug:  linaclotide TAKE 1 CAPSULE (145 MCG TOTAL) BY MOUTH DAILY.   ondansetron 4 MG disintegrating tablet Commonly known as:  ZOFRAN ODT Take 1 tablet (4 mg total) by  mouth every 8 (eight) hours as needed for nausea or vomiting.   PAZEO 0.7 % Soln Generic drug:  Olopatadine HCl Place 1 drop into both eyes daily.   Vitamin D3 2000 units Tabs Take 2 tablets by mouth daily after breakfast.        Follow-up: Return in about 2 weeks (around 11/07/2016).  Claretta Fraise, M.D.

## 2016-11-12 ENCOUNTER — Encounter (HOSPITAL_COMMUNITY): Payer: Self-pay | Admitting: Cardiology

## 2016-11-12 ENCOUNTER — Emergency Department (HOSPITAL_COMMUNITY)
Admission: EM | Admit: 2016-11-12 | Discharge: 2016-11-12 | Disposition: A | Discharge status: Home / Self Care | Payer: Medicare Other | Attending: Emergency Medicine | Admitting: Emergency Medicine

## 2016-11-12 ENCOUNTER — Emergency Department (HOSPITAL_COMMUNITY): Payer: Medicare Other

## 2016-11-12 DIAGNOSIS — Z79899 Other long term (current) drug therapy: Secondary | ICD-10-CM | POA: Diagnosis not present

## 2016-11-12 DIAGNOSIS — S01111A Laceration without foreign body of right eyelid and periocular area, initial encounter: Secondary | ICD-10-CM | POA: Diagnosis not present

## 2016-11-12 DIAGNOSIS — W19XXXA Unspecified fall, initial encounter: Secondary | ICD-10-CM

## 2016-11-12 DIAGNOSIS — I10 Essential (primary) hypertension: Secondary | ICD-10-CM | POA: Diagnosis not present

## 2016-11-12 DIAGNOSIS — S0990XA Unspecified injury of head, initial encounter: Secondary | ICD-10-CM

## 2016-11-12 DIAGNOSIS — Y939 Activity, unspecified: Secondary | ICD-10-CM | POA: Diagnosis not present

## 2016-11-12 DIAGNOSIS — Z049 Encounter for examination and observation for unspecified reason: Secondary | ICD-10-CM | POA: Diagnosis not present

## 2016-11-12 DIAGNOSIS — Y999 Unspecified external cause status: Secondary | ICD-10-CM | POA: Insufficient documentation

## 2016-11-12 DIAGNOSIS — S6992XA Unspecified injury of left wrist, hand and finger(s), initial encounter: Secondary | ICD-10-CM | POA: Insufficient documentation

## 2016-11-12 DIAGNOSIS — S52502A Unspecified fracture of the lower end of left radius, initial encounter for closed fracture: Secondary | ICD-10-CM | POA: Diagnosis not present

## 2016-11-12 DIAGNOSIS — S0181XA Laceration without foreign body of other part of head, initial encounter: Secondary | ICD-10-CM | POA: Insufficient documentation

## 2016-11-12 DIAGNOSIS — Y929 Unspecified place or not applicable: Secondary | ICD-10-CM | POA: Insufficient documentation

## 2016-11-12 DIAGNOSIS — M25532 Pain in left wrist: Secondary | ICD-10-CM | POA: Diagnosis not present

## 2016-11-12 DIAGNOSIS — W0110XA Fall on same level from slipping, tripping and stumbling with subsequent striking against unspecified object, initial encounter: Secondary | ICD-10-CM | POA: Insufficient documentation

## 2016-11-12 NOTE — ED Triage Notes (Signed)
Tripped today while walking across a deck carrying items.  Laceration above left eye.  Injury to left wrist.

## 2016-11-12 NOTE — ED Notes (Signed)
Dinner provided.

## 2016-11-12 NOTE — ED Triage Notes (Signed)
Carrying garbage across deck and fell onto L wrist and elbow

## 2016-11-12 NOTE — ED Notes (Signed)
Dr Z in to assess 

## 2016-11-12 NOTE — ED Notes (Signed)
Pt with small abrasion to her L forehead L wrist iced, elevated as well as warm blanket provided  Pt has deformity to her L wrist- ring removed

## 2016-11-12 NOTE — Discharge Instructions (Signed)
I keep the splint on and in place. Keep it dry. Call Dr. Ruthe Mannan office on Monday for follow-up. Elevate your left arm is much as possible. CT scan of your head showed no acute injuries from the fall. The wound to the left eyebrow eye area will heal on its own.

## 2016-11-12 NOTE — ED Provider Notes (Signed)
[facility] DEPT Provider Note   CSN: [id number] Arrival date & time: 11/12/16  1428     History   Chief Complaint Chief Complaint  Patient presents with  . Fall    HPI Marissa Burns is a 83 y.o. female.  Patient stumbled and fell striking her head on the left side as well as injuring her left wrist.Patient without any other complaints. No loss of consciousness. Patient not on blood thinners. Patient's been able to get up on her feet and ambulate since the fall. Patient states her tetanus is up-to-date.      Past Medical History:  Diagnosis Date  . Cataract   . Colon polyp   . Diverticulosis   . Edema   . Elevated blood sugar   . Essential hypertension, benign   . High cholesterol   . Osteoarthritis   . Osteoporosis   . Other and unspecified hyperlipidemia     Patient Active Problem List   Diagnosis Date Noted  . BPPV (benign paroxysmal positional vertigo) 12/10/2014  . Visual impairment 01/22/2014  . Chronic constipation 10/08/2013  . Primary osteoarthritis of both knees 10/08/2013  . Hearing deficit 05/20/2013  . Hypertension 10/30/2012  . Hyperlipemia 10/30/2012  . Vitamin D deficiency 10/30/2012  . Arthritis 10/30/2012  . Colon polyp   . Diverticulosis   . Osteoarthritis   . Edema   . Osteoporosis   . Elevated blood sugar   . Cataract     Past Surgical History:  Procedure Laterality Date  . APPENDECTOMY    . CATARACT EXTRACTION    . TONSILLECTOMY      OB History    No data available       Home Medications    Prior to Admission medications   Medication Sig Start Date End Date Taking? Authorizing Provider  acetaminophen (TYLENOL) 500 MG tablet Take 1,000 mg by mouth daily as needed for moderate pain.     [provider]  amLODipine (NORVASC) 5 MG tablet TAKE 1 TABLET (5 MG TOTAL) BY MOUTH DAILY. 10/18/16   Chipper Herb, MD  amoxicillin-clavulanate (AUGMENTIN) 875-125 MG tablet Take 1 tablet by mouth every 12 (twelve)  hours. 10/11/16   Daleen Bo, MD  aspirin 81 MG EC tablet Take 81 mg by mouth every morning.     [provider]  atorvastatin (LIPITOR) 80 MG tablet TAKE 1 TABLET (80 MG TOTAL) BY MOUTH DAILY. 10/18/16   Chipper Herb, MD  benazepril (LOTENSIN) 20 MG tablet TAKE ONE TABLET BY MOUTH ONE TIME DAILY 10/18/16   Chipper Herb, MD  busPIRone (BUSPAR) 5 MG tablet Take 5 mg by mouth 2 (two) times daily as needed (anxiety).    [provider]  calcium carbonate (TUMS - DOSED IN MG ELEMENTAL CALCIUM) 500 MG chewable tablet Chew 1 tablet by mouth daily.      [provider]  Cholecalciferol (VITAMIN D3) 2000 UNITS TABS Take 2 tablets by mouth daily after breakfast.     [provider]  diclofenac sodium (VOLTAREN) 1 % GEL Apply 2 g topically 4 (four) times daily. Patient taking differently: Apply 2 g topically daily as needed (pain).  06/05/16   Timmothy Euler, MD  furosemide (LASIX) 20 MG tablet Take 1 tablet (20 mg total) by mouth 2 (two) times daily. TAKE ONE TABLET BY MOUTH ONE  TIME DAILY 10/11/16   Daleen Bo, MD  LINZESS 145 MCG CAPS capsule TAKE 1 CAPSULE (145 MCG TOTAL) BY MOUTH DAILY. 10/23/16  Chipper Herb, MD  ondansetron (ZOFRAN ODT) 4 MG disintegrating tablet Take 1 tablet (4 mg total) by mouth every 8 (eight) hours as needed for nausea or vomiting. Patient not taking: Reported on 09/27/2016 09/22/16   Evalee Jefferson, PA-C  PAZEO 0.7 % SOLN Place 1 drop into both eyes daily.  07/12/16   [provider]    Family History No family history on file.  Social History Social History  Substance Use Topics  . Smoking status: Never Smoker  . Smokeless tobacco: Never Used  . Alcohol use No     Allergies   Alendronate sodium; Celebrex [celecoxib]; Dilacor [diltiazem hcl]; Evista [raloxifene hydrochloride]; and Propulsid [cisapride]   Review of Systems Review of Systems  Constitutional: Negative for fever.  HENT: Negative for congestion.     Eyes: Negative for redness.  Respiratory: Negative for shortness of breath.   Cardiovascular: Negative for chest pain.  Gastrointestinal: Negative for abdominal pain.  Genitourinary: Negative for dysuria.  Musculoskeletal: Negative for back pain and neck pain.  Skin: Positive for wound.  Neurological: Negative for headaches.  Hematological: Does not bruise/bleed easily.  Psychiatric/Behavioral: Negative for confusion.     Physical Exam Updated Vital Signs BP (!) 131/59   Pulse 71   Temp 98.2 F (36.8 C) (Oral)   Resp 18   Ht 1.6 m (5\' 3" )   Wt 58.5 kg (129 lb)   SpO2 94%   BMI 22.85 kg/m   Physical Exam  Constitutional: She is oriented to person, place, and time. She appears well-developed and well-nourished. No distress.  HENT:  Bruising and swelling to the left eyebrow laterally 2 cm laceration no active bleeding.  Eyes: Pupils are equal, round, and reactive to light. Conjunctivae and EOM are normal.  Neck: Normal range of motion. Neck supple.  Cardiovascular: Normal rate and regular rhythm.   Pulmonary/Chest: Effort normal and breath sounds normal. No respiratory distress.  Abdominal: Soft. Bowel sounds are normal. There is no tenderness.  Musculoskeletal: Normal range of motion.  Deformity and swelling to left wrist area. Radial pulses 2+. Refill is less than 2 seconds. Sensations intact. No evidence of injury to the elbow or shoulder.  Neurological: She is alert and oriented to person, place, and time. No cranial nerve deficit or sensory deficit. She exhibits normal muscle tone. Coordination normal.  Skin: Skin is warm.  Nursing note and vitals reviewed.    ED Treatments / Results  Labs (all labs ordered are listed, but only abnormal results are displayed) Labs Reviewed - No data to display  EKG  EKG Interpretation None       Radiology Dg Wrist Complete Left  Result Date: 11/12/2016 CLINICAL DATA:  Fall, pain. EXAM: LEFT WRIST - COMPLETE 3+ VIEW  COMPARISON:  None. FINDINGS: Displaced fracture of the distal left radius, with dorsal angulation deformity. Additional displaced fracture of the ulnar styloid, likely chronic. No carpal bone fracture or dislocation seen. Degenerative change at the first [facility] joint, moderate in degree with associated osseous spurring. IMPRESSION: Displaced/comminuted fracture of the distal left radius. Electronically Signed   By: Franki Cabot M.D.   On: 11/12/2016 15:07   Ct Head Wo Contrast  Result Date: 11/12/2016 CLINICAL DATA:  Tripped and fell while walking on deck. LEFT periorbital laceration. EXAM: CT HEAD WITHOUT CONTRAST TECHNIQUE: Contiguous axial images were obtained from the base of the skull through the vertex without intravenous contrast. COMPARISON:  CT HEAD December 07, 2014 FINDINGS: BRAIN: No intraparenchymal hemorrhage, mass effect nor  midline shift. The ventricles and sulci are normal for age. Patchy supratentorial white matter hypodensities within normal range for patient's age, though non-specific are most compatible with chronic small vessel ischemic disease. Old basal ganglia lacunar infarcts. Old small LEFT cerebellar infarct. No acute large vascular territory infarcts. No abnormal extra-axial fluid collections. Basal cisterns are patent. VASCULAR: Moderate to severe calcific atherosclerosis of the carotid siphons. SKULL: No skull fracture. Moderate degenerative changes temporomandibular joints. LEFT lateral orbital soft tissue swelling without subcutaneous gas or radiopaque foreign bodies. SINUSES/ORBITS: The mastoid air-cells and included paranasal sinuses are well-aerated.The included ocular globes and orbital contents are non-suspicious. Status post bilateral ocular lens implants. OTHER: None. IMPRESSION: 1. LEFT periorbital soft tissue swelling without postseptal extent. No acute intracranial process. 2. Stable examination including moderate chronic small vessel ischemic disease in scattered lacunar  infarcts. Electronically Signed   By: Elon Alas M.D.   On: 11/12/2016 18:08    Procedures Procedures (including critical care time)  Medications Ordered in ED Medications - No data to display   Initial Impression / Assessment and Plan / ED Course  I have reviewed the triage vital signs and the nursing notes.  Pertinent labs & imaging results that were available during my care of the patient were reviewed by me and considered in my medical decision making (see chart for details).    Patient stumbled with her walker on her patio which has a hard tile surface. Shortly prior to arrival. Resulting in injury to her left wrist area. X-rays show distal radius comminuted fracture. With some displacement. Patient has some chronic injuries to the base of the thumb. For the distal radius fracture placed in a sugar tong splint along with a sling. Patient once to follow-up locally with Dr. Aline Brochure.  Patient also struck the side of her head right around the left eye and eyebrow area 2 cm laceration there that's close no active bleeding. CT of the head showed no evidence of any acute traumatic injury.  Final Clinical Impressions(s) / ED Diagnoses   Final diagnoses:  Fall, initial encounter  Closed fracture of distal end of left radius, unspecified fracture morphology, initial encounter  Minor head injury, initial encounter  Facial laceration, initial encounter    New Prescriptions New Prescriptions   No medications on file     Fredia Sorrow, MD 11/12/16 1851

## 2016-11-13 ENCOUNTER — Telehealth: Payer: Self-pay | Admitting: Family Medicine

## 2016-11-13 NOTE — Telephone Encounter (Signed)
Per Tommi Rumps with [facility] they will try and see this pt today!Vira Agar aware

## 2016-11-13 NOTE — Telephone Encounter (Signed)
There is an order for [facility] in chart - active from 10/11/16. I will contact [facility] and see if they can see this pt.

## 2016-11-13 NOTE — Telephone Encounter (Signed)
Please address

## 2016-11-14 ENCOUNTER — Ambulatory Visit (INDEPENDENT_AMBULATORY_CARE_PROVIDER_SITE_OTHER): Payer: Medicare Other | Admitting: Orthopedic Surgery

## 2016-11-14 ENCOUNTER — Encounter: Payer: Self-pay | Admitting: Orthopedic Surgery

## 2016-11-14 VITALS — BP 120/72 | HR 86

## 2016-11-14 DIAGNOSIS — S52532A Colles' fracture of left radius, initial encounter for closed fracture: Secondary | ICD-10-CM | POA: Diagnosis not present

## 2016-11-14 NOTE — Progress Notes (Signed)
  NEW PATIENT OFFICE VISIT    Chief Complaint  Patient presents with  . Wrist Injury    Left wrist pain status post fall, 11/12/2016    This is a 83 year old female who presents with a comminuted fracture of the left wrist status post fall 2 days ago. She was placed in a splint and advised to follow-up with an orthopedist. She actually has mild dull constant pain left wrist for 2 days duration    Review of Systems  Respiratory: Negative for shortness of breath.   Cardiovascular: Negative for chest pain.  Skin: Negative for rash.  Neurological: Negative for tingling and sensory change.    Past Medical History:  Diagnosis Date  . Cataract   . Colon polyp   . Diverticulosis   . Edema   . Elevated blood sugar   . Essential hypertension, benign   . High cholesterol   . Osteoarthritis   . Osteoporosis   . Other and unspecified hyperlipidemia     Past Surgical History:  Procedure Laterality Date  . APPENDECTOMY    . CATARACT EXTRACTION    . TONSILLECTOMY      No family history on file. Social History  Substance Use Topics  . Smoking status: Never Smoker  . Smokeless tobacco: Never Used  . Alcohol use No    There were no vitals taken for this visit.  Physical Exam  Constitutional: She appears well-developed and well-nourished.  Neurological: She is alert. No sensory deficit.  Psychiatric: Her behavior is normal.  Vitals reviewed.    She presents in a wheelchair Ortho Exam   Realignment of her right upper extremity seems normal with normal elbow wrist and hand motion without joint subluxation atrophy skin lesion sensory change and normal pulse and perfusion  THE LEFT ARM IS IN A SPLINT  The fingers are slightly swollen., There are arthritic nodules in the small joints. Capillary refill is normal and sensation is normal  The x-ray shows an intra-articular comminuted fracture of the left distal radius with angulation and displacement  Encounter Diagnosis   Name Primary?  . Closed Colles' fracture of left radius, initial encounter Yes    PLAN:   Recommend referral to a hand specialist due to the intra-articular nature including the dorsal displacement of the lunate fossa.

## 2016-11-15 ENCOUNTER — Telehealth: Payer: Self-pay | Admitting: Family Medicine

## 2016-11-15 NOTE — Telephone Encounter (Signed)
I called and spoke with the patient's sister [name]. I am in full agreement that with the patient's condition that she needs to be in a skilled nursing facility so that she can get the care that she needs with her daily activities and with ambulation. The sister said she would discuss this with the patient's grandson and hopefully they can convince her to go where she gets better care.

## 2016-11-15 NOTE — Telephone Encounter (Signed)
Please review

## 2016-11-15 NOTE — Telephone Encounter (Signed)
Spoke with Marissa Burns.  She and Home Health say pt needs to be in a nursing home. Marissa Burns knows the procedure of getting her placed - can you call the Pt and speak with her sometime today about your thoughts on the nursing home.Marland Kitchen  She will listen to "Dr Laurance Flatten"

## 2016-11-20 DIAGNOSIS — S52532A Colles' fracture of left radius, initial encounter for closed fracture: Secondary | ICD-10-CM | POA: Diagnosis not present

## 2016-11-21 DIAGNOSIS — R531 Weakness: Secondary | ICD-10-CM | POA: Diagnosis not present

## 2016-11-21 DIAGNOSIS — K5904 Chronic idiopathic constipation: Secondary | ICD-10-CM | POA: Diagnosis not present

## 2016-11-21 DIAGNOSIS — I69015 Cognitive social or emotional deficit following nontraumatic subarachnoid hemorrhage: Secondary | ICD-10-CM | POA: Diagnosis not present

## 2016-11-21 DIAGNOSIS — M1389 Other specified arthritis, multiple sites: Secondary | ICD-10-CM | POA: Diagnosis not present

## 2016-11-21 DIAGNOSIS — S52532D Colles' fracture of left radius, subsequent encounter for closed fracture with routine healing: Secondary | ICD-10-CM | POA: Diagnosis not present

## 2016-11-21 DIAGNOSIS — F419 Anxiety disorder, unspecified: Secondary | ICD-10-CM | POA: Diagnosis not present

## 2016-11-22 DIAGNOSIS — F419 Anxiety disorder, unspecified: Secondary | ICD-10-CM | POA: Diagnosis not present

## 2016-11-22 DIAGNOSIS — S52532D Colles' fracture of left radius, subsequent encounter for closed fracture with routine healing: Secondary | ICD-10-CM | POA: Diagnosis not present

## 2016-11-22 DIAGNOSIS — S52532A Colles' fracture of left radius, initial encounter for closed fracture: Secondary | ICD-10-CM | POA: Diagnosis not present

## 2016-11-22 DIAGNOSIS — I69015 Cognitive social or emotional deficit following nontraumatic subarachnoid hemorrhage: Secondary | ICD-10-CM | POA: Diagnosis not present

## 2016-11-22 DIAGNOSIS — K5904 Chronic idiopathic constipation: Secondary | ICD-10-CM | POA: Diagnosis not present

## 2016-11-22 DIAGNOSIS — M1389 Other specified arthritis, multiple sites: Secondary | ICD-10-CM | POA: Diagnosis not present

## 2016-11-22 DIAGNOSIS — E559 Vitamin D deficiency, unspecified: Secondary | ICD-10-CM | POA: Diagnosis not present

## 2016-11-23 DIAGNOSIS — E039 Hypothyroidism, unspecified: Secondary | ICD-10-CM | POA: Diagnosis not present

## 2016-11-23 DIAGNOSIS — E119 Type 2 diabetes mellitus without complications: Secondary | ICD-10-CM | POA: Diagnosis not present

## 2016-11-23 DIAGNOSIS — S52532D Colles' fracture of left radius, subsequent encounter for closed fracture with routine healing: Secondary | ICD-10-CM | POA: Diagnosis not present

## 2016-11-23 DIAGNOSIS — E7800 Pure hypercholesterolemia, unspecified: Secondary | ICD-10-CM | POA: Diagnosis not present

## 2016-11-23 DIAGNOSIS — D518 Other vitamin B12 deficiency anemias: Secondary | ICD-10-CM | POA: Diagnosis not present

## 2016-11-23 DIAGNOSIS — I69015 Cognitive social or emotional deficit following nontraumatic subarachnoid hemorrhage: Secondary | ICD-10-CM | POA: Diagnosis not present

## 2016-11-23 DIAGNOSIS — E782 Mixed hyperlipidemia: Secondary | ICD-10-CM | POA: Diagnosis not present

## 2016-11-23 DIAGNOSIS — D688 Other specified coagulation defects: Secondary | ICD-10-CM | POA: Diagnosis not present

## 2016-11-23 DIAGNOSIS — Z79899 Other long term (current) drug therapy: Secondary | ICD-10-CM | POA: Diagnosis not present

## 2016-11-23 DIAGNOSIS — M1389 Other specified arthritis, multiple sites: Secondary | ICD-10-CM | POA: Diagnosis not present

## 2016-11-23 DIAGNOSIS — E559 Vitamin D deficiency, unspecified: Secondary | ICD-10-CM | POA: Diagnosis not present

## 2016-11-24 DIAGNOSIS — S52532D Colles' fracture of left radius, subsequent encounter for closed fracture with routine healing: Secondary | ICD-10-CM | POA: Diagnosis not present

## 2016-11-24 DIAGNOSIS — M1389 Other specified arthritis, multiple sites: Secondary | ICD-10-CM | POA: Diagnosis not present

## 2016-11-24 DIAGNOSIS — I69015 Cognitive social or emotional deficit following nontraumatic subarachnoid hemorrhage: Secondary | ICD-10-CM | POA: Diagnosis not present

## 2016-11-25 DIAGNOSIS — S52532D Colles' fracture of left radius, subsequent encounter for closed fracture with routine healing: Secondary | ICD-10-CM | POA: Diagnosis not present

## 2016-11-25 DIAGNOSIS — I69015 Cognitive social or emotional deficit following nontraumatic subarachnoid hemorrhage: Secondary | ICD-10-CM | POA: Diagnosis not present

## 2016-11-25 DIAGNOSIS — M1389 Other specified arthritis, multiple sites: Secondary | ICD-10-CM | POA: Diagnosis not present

## 2016-11-27 DIAGNOSIS — S52532D Colles' fracture of left radius, subsequent encounter for closed fracture with routine healing: Secondary | ICD-10-CM | POA: Diagnosis not present

## 2016-11-27 DIAGNOSIS — I69015 Cognitive social or emotional deficit following nontraumatic subarachnoid hemorrhage: Secondary | ICD-10-CM | POA: Diagnosis not present

## 2016-11-27 DIAGNOSIS — M1389 Other specified arthritis, multiple sites: Secondary | ICD-10-CM | POA: Diagnosis not present

## 2016-11-28 DIAGNOSIS — M1389 Other specified arthritis, multiple sites: Secondary | ICD-10-CM | POA: Diagnosis not present

## 2016-11-28 DIAGNOSIS — S52532D Colles' fracture of left radius, subsequent encounter for closed fracture with routine healing: Secondary | ICD-10-CM | POA: Diagnosis not present

## 2016-11-28 DIAGNOSIS — I69015 Cognitive social or emotional deficit following nontraumatic subarachnoid hemorrhage: Secondary | ICD-10-CM | POA: Diagnosis not present

## 2016-11-29 DIAGNOSIS — M1389 Other specified arthritis, multiple sites: Secondary | ICD-10-CM | POA: Diagnosis not present

## 2016-11-29 DIAGNOSIS — S52532D Colles' fracture of left radius, subsequent encounter for closed fracture with routine healing: Secondary | ICD-10-CM | POA: Diagnosis not present

## 2016-11-29 DIAGNOSIS — I69015 Cognitive social or emotional deficit following nontraumatic subarachnoid hemorrhage: Secondary | ICD-10-CM | POA: Diagnosis not present

## 2016-11-30 DIAGNOSIS — S52532D Colles' fracture of left radius, subsequent encounter for closed fracture with routine healing: Secondary | ICD-10-CM | POA: Diagnosis not present

## 2016-11-30 DIAGNOSIS — M1389 Other specified arthritis, multiple sites: Secondary | ICD-10-CM | POA: Diagnosis not present

## 2016-11-30 DIAGNOSIS — I69015 Cognitive social or emotional deficit following nontraumatic subarachnoid hemorrhage: Secondary | ICD-10-CM | POA: Diagnosis not present

## 2016-12-01 DIAGNOSIS — S52532D Colles' fracture of left radius, subsequent encounter for closed fracture with routine healing: Secondary | ICD-10-CM | POA: Diagnosis not present

## 2016-12-01 DIAGNOSIS — I69015 Cognitive social or emotional deficit following nontraumatic subarachnoid hemorrhage: Secondary | ICD-10-CM | POA: Diagnosis not present

## 2016-12-01 DIAGNOSIS — M1389 Other specified arthritis, multiple sites: Secondary | ICD-10-CM | POA: Diagnosis not present

## 2016-12-02 DIAGNOSIS — S52532D Colles' fracture of left radius, subsequent encounter for closed fracture with routine healing: Secondary | ICD-10-CM | POA: Diagnosis not present

## 2016-12-02 DIAGNOSIS — M1389 Other specified arthritis, multiple sites: Secondary | ICD-10-CM | POA: Diagnosis not present

## 2016-12-02 DIAGNOSIS — I69015 Cognitive social or emotional deficit following nontraumatic subarachnoid hemorrhage: Secondary | ICD-10-CM | POA: Diagnosis not present

## 2016-12-03 DIAGNOSIS — M1389 Other specified arthritis, multiple sites: Secondary | ICD-10-CM | POA: Diagnosis not present

## 2016-12-03 DIAGNOSIS — I69015 Cognitive social or emotional deficit following nontraumatic subarachnoid hemorrhage: Secondary | ICD-10-CM | POA: Diagnosis not present

## 2016-12-03 DIAGNOSIS — S52532D Colles' fracture of left radius, subsequent encounter for closed fracture with routine healing: Secondary | ICD-10-CM | POA: Diagnosis not present

## 2016-12-04 DIAGNOSIS — S52532D Colles' fracture of left radius, subsequent encounter for closed fracture with routine healing: Secondary | ICD-10-CM | POA: Diagnosis not present

## 2016-12-04 DIAGNOSIS — I69015 Cognitive social or emotional deficit following nontraumatic subarachnoid hemorrhage: Secondary | ICD-10-CM | POA: Diagnosis not present

## 2016-12-04 DIAGNOSIS — M1389 Other specified arthritis, multiple sites: Secondary | ICD-10-CM | POA: Diagnosis not present

## 2016-12-05 DIAGNOSIS — I69015 Cognitive social or emotional deficit following nontraumatic subarachnoid hemorrhage: Secondary | ICD-10-CM | POA: Diagnosis not present

## 2016-12-05 DIAGNOSIS — M1389 Other specified arthritis, multiple sites: Secondary | ICD-10-CM | POA: Diagnosis not present

## 2016-12-05 DIAGNOSIS — S52532D Colles' fracture of left radius, subsequent encounter for closed fracture with routine healing: Secondary | ICD-10-CM | POA: Diagnosis not present

## 2016-12-06 DIAGNOSIS — S52532D Colles' fracture of left radius, subsequent encounter for closed fracture with routine healing: Secondary | ICD-10-CM | POA: Diagnosis not present

## 2016-12-06 DIAGNOSIS — I69015 Cognitive social or emotional deficit following nontraumatic subarachnoid hemorrhage: Secondary | ICD-10-CM | POA: Diagnosis not present

## 2016-12-06 DIAGNOSIS — M1389 Other specified arthritis, multiple sites: Secondary | ICD-10-CM | POA: Diagnosis not present

## 2016-12-07 DIAGNOSIS — I69015 Cognitive social or emotional deficit following nontraumatic subarachnoid hemorrhage: Secondary | ICD-10-CM | POA: Diagnosis not present

## 2016-12-07 DIAGNOSIS — S52532D Colles' fracture of left radius, subsequent encounter for closed fracture with routine healing: Secondary | ICD-10-CM | POA: Diagnosis not present

## 2016-12-07 DIAGNOSIS — Z79899 Other long term (current) drug therapy: Secondary | ICD-10-CM | POA: Diagnosis not present

## 2016-12-07 DIAGNOSIS — M1389 Other specified arthritis, multiple sites: Secondary | ICD-10-CM | POA: Diagnosis not present

## 2016-12-08 DIAGNOSIS — M1389 Other specified arthritis, multiple sites: Secondary | ICD-10-CM | POA: Diagnosis not present

## 2016-12-08 DIAGNOSIS — S52532D Colles' fracture of left radius, subsequent encounter for closed fracture with routine healing: Secondary | ICD-10-CM | POA: Diagnosis not present

## 2016-12-08 DIAGNOSIS — I69015 Cognitive social or emotional deficit following nontraumatic subarachnoid hemorrhage: Secondary | ICD-10-CM | POA: Diagnosis not present

## 2016-12-11 DIAGNOSIS — S52532D Colles' fracture of left radius, subsequent encounter for closed fracture with routine healing: Secondary | ICD-10-CM | POA: Diagnosis not present

## 2016-12-12 DIAGNOSIS — S52532D Colles' fracture of left radius, subsequent encounter for closed fracture with routine healing: Secondary | ICD-10-CM | POA: Diagnosis not present

## 2016-12-13 DIAGNOSIS — S52532D Colles' fracture of left radius, subsequent encounter for closed fracture with routine healing: Secondary | ICD-10-CM | POA: Diagnosis not present

## 2016-12-14 DIAGNOSIS — S52532D Colles' fracture of left radius, subsequent encounter for closed fracture with routine healing: Secondary | ICD-10-CM | POA: Diagnosis not present

## 2016-12-14 DIAGNOSIS — M1711 Unilateral primary osteoarthritis, right knee: Secondary | ICD-10-CM | POA: Diagnosis not present

## 2016-12-15 DIAGNOSIS — S52532D Colles' fracture of left radius, subsequent encounter for closed fracture with routine healing: Secondary | ICD-10-CM | POA: Diagnosis not present

## 2016-12-16 DIAGNOSIS — S52532D Colles' fracture of left radius, subsequent encounter for closed fracture with routine healing: Secondary | ICD-10-CM | POA: Diagnosis not present

## 2016-12-18 DIAGNOSIS — S52532D Colles' fracture of left radius, subsequent encounter for closed fracture with routine healing: Secondary | ICD-10-CM | POA: Diagnosis not present

## 2016-12-19 DIAGNOSIS — S52532D Colles' fracture of left radius, subsequent encounter for closed fracture with routine healing: Secondary | ICD-10-CM | POA: Diagnosis not present

## 2016-12-20 DIAGNOSIS — S52532D Colles' fracture of left radius, subsequent encounter for closed fracture with routine healing: Secondary | ICD-10-CM | POA: Diagnosis not present

## 2016-12-21 DIAGNOSIS — M1389 Other specified arthritis, multiple sites: Secondary | ICD-10-CM | POA: Diagnosis not present

## 2016-12-21 DIAGNOSIS — S52532D Colles' fracture of left radius, subsequent encounter for closed fracture with routine healing: Secondary | ICD-10-CM | POA: Diagnosis not present

## 2016-12-21 DIAGNOSIS — I10 Essential (primary) hypertension: Secondary | ICD-10-CM | POA: Diagnosis not present

## 2016-12-21 DIAGNOSIS — R531 Weakness: Secondary | ICD-10-CM | POA: Diagnosis not present

## 2016-12-21 DIAGNOSIS — F419 Anxiety disorder, unspecified: Secondary | ICD-10-CM | POA: Diagnosis not present

## 2016-12-22 DIAGNOSIS — S52532D Colles' fracture of left radius, subsequent encounter for closed fracture with routine healing: Secondary | ICD-10-CM | POA: Diagnosis not present

## 2016-12-25 DIAGNOSIS — S52532D Colles' fracture of left radius, subsequent encounter for closed fracture with routine healing: Secondary | ICD-10-CM | POA: Diagnosis not present

## 2016-12-26 DIAGNOSIS — S52532D Colles' fracture of left radius, subsequent encounter for closed fracture with routine healing: Secondary | ICD-10-CM | POA: Diagnosis not present

## 2016-12-27 DIAGNOSIS — S52532D Colles' fracture of left radius, subsequent encounter for closed fracture with routine healing: Secondary | ICD-10-CM | POA: Diagnosis not present

## 2016-12-28 ENCOUNTER — Ambulatory Visit: Payer: Medicare Other | Admitting: Family Medicine

## 2016-12-28 DIAGNOSIS — S52532D Colles' fracture of left radius, subsequent encounter for closed fracture with routine healing: Secondary | ICD-10-CM | POA: Diagnosis not present

## 2016-12-29 DIAGNOSIS — K5904 Chronic idiopathic constipation: Secondary | ICD-10-CM | POA: Diagnosis not present

## 2016-12-29 DIAGNOSIS — E559 Vitamin D deficiency, unspecified: Secondary | ICD-10-CM | POA: Diagnosis not present

## 2016-12-29 DIAGNOSIS — S52532D Colles' fracture of left radius, subsequent encounter for closed fracture with routine healing: Secondary | ICD-10-CM | POA: Diagnosis not present

## 2016-12-29 DIAGNOSIS — F419 Anxiety disorder, unspecified: Secondary | ICD-10-CM | POA: Diagnosis not present

## 2016-12-29 DIAGNOSIS — E785 Hyperlipidemia, unspecified: Secondary | ICD-10-CM | POA: Diagnosis not present

## 2017-01-01 DIAGNOSIS — S52532D Colles' fracture of left radius, subsequent encounter for closed fracture with routine healing: Secondary | ICD-10-CM | POA: Diagnosis not present

## 2017-01-02 DIAGNOSIS — S52532D Colles' fracture of left radius, subsequent encounter for closed fracture with routine healing: Secondary | ICD-10-CM | POA: Diagnosis not present

## 2017-01-03 DIAGNOSIS — S52532D Colles' fracture of left radius, subsequent encounter for closed fracture with routine healing: Secondary | ICD-10-CM | POA: Diagnosis not present

## 2017-01-04 DIAGNOSIS — I10 Essential (primary) hypertension: Secondary | ICD-10-CM | POA: Diagnosis not present

## 2017-01-04 DIAGNOSIS — R609 Edema, unspecified: Secondary | ICD-10-CM | POA: Diagnosis not present

## 2017-01-04 DIAGNOSIS — S52532D Colles' fracture of left radius, subsequent encounter for closed fracture with routine healing: Secondary | ICD-10-CM | POA: Diagnosis not present

## 2017-01-04 DIAGNOSIS — S52532A Colles' fracture of left radius, initial encounter for closed fracture: Secondary | ICD-10-CM | POA: Diagnosis not present

## 2017-01-04 DIAGNOSIS — N182 Chronic kidney disease, stage 2 (mild): Secondary | ICD-10-CM | POA: Diagnosis not present

## 2017-01-05 DIAGNOSIS — S52532D Colles' fracture of left radius, subsequent encounter for closed fracture with routine healing: Secondary | ICD-10-CM | POA: Diagnosis not present

## 2017-01-05 DIAGNOSIS — Z79899 Other long term (current) drug therapy: Secondary | ICD-10-CM | POA: Diagnosis not present

## 2017-01-07 DIAGNOSIS — S52532D Colles' fracture of left radius, subsequent encounter for closed fracture with routine healing: Secondary | ICD-10-CM | POA: Diagnosis not present

## 2017-01-08 DIAGNOSIS — S52532D Colles' fracture of left radius, subsequent encounter for closed fracture with routine healing: Secondary | ICD-10-CM | POA: Diagnosis not present

## 2017-01-09 DIAGNOSIS — S52532D Colles' fracture of left radius, subsequent encounter for closed fracture with routine healing: Secondary | ICD-10-CM | POA: Diagnosis not present

## 2017-01-10 DIAGNOSIS — S52532D Colles' fracture of left radius, subsequent encounter for closed fracture with routine healing: Secondary | ICD-10-CM | POA: Diagnosis not present

## 2017-01-11 DIAGNOSIS — S52532D Colles' fracture of left radius, subsequent encounter for closed fracture with routine healing: Secondary | ICD-10-CM | POA: Diagnosis not present

## 2017-01-11 DIAGNOSIS — N182 Chronic kidney disease, stage 2 (mild): Secondary | ICD-10-CM | POA: Diagnosis not present

## 2017-01-11 DIAGNOSIS — M1389 Other specified arthritis, multiple sites: Secondary | ICD-10-CM | POA: Diagnosis not present

## 2017-01-11 DIAGNOSIS — I10 Essential (primary) hypertension: Secondary | ICD-10-CM | POA: Diagnosis not present

## 2017-01-11 DIAGNOSIS — R609 Edema, unspecified: Secondary | ICD-10-CM | POA: Diagnosis not present

## 2017-01-12 DIAGNOSIS — S52532D Colles' fracture of left radius, subsequent encounter for closed fracture with routine healing: Secondary | ICD-10-CM | POA: Diagnosis not present

## 2017-01-15 DIAGNOSIS — S52532D Colles' fracture of left radius, subsequent encounter for closed fracture with routine healing: Secondary | ICD-10-CM | POA: Diagnosis not present

## 2017-01-16 DIAGNOSIS — S52532D Colles' fracture of left radius, subsequent encounter for closed fracture with routine healing: Secondary | ICD-10-CM | POA: Diagnosis not present

## 2017-01-17 DIAGNOSIS — S52532D Colles' fracture of left radius, subsequent encounter for closed fracture with routine healing: Secondary | ICD-10-CM | POA: Diagnosis not present

## 2017-01-18 DIAGNOSIS — S52532D Colles' fracture of left radius, subsequent encounter for closed fracture with routine healing: Secondary | ICD-10-CM | POA: Diagnosis not present

## 2017-01-23 DIAGNOSIS — R609 Edema, unspecified: Secondary | ICD-10-CM | POA: Diagnosis not present

## 2017-01-23 DIAGNOSIS — N182 Chronic kidney disease, stage 2 (mild): Secondary | ICD-10-CM | POA: Diagnosis not present

## 2017-01-23 DIAGNOSIS — R531 Weakness: Secondary | ICD-10-CM | POA: Diagnosis not present

## 2017-01-23 DIAGNOSIS — F419 Anxiety disorder, unspecified: Secondary | ICD-10-CM | POA: Diagnosis not present

## 2017-01-24 ENCOUNTER — Ambulatory Visit: Payer: Medicare Other | Admitting: Family Medicine

## 2017-01-25 ENCOUNTER — Encounter: Payer: Self-pay | Admitting: Family Medicine

## 2017-01-25 ENCOUNTER — Ambulatory Visit (INDEPENDENT_AMBULATORY_CARE_PROVIDER_SITE_OTHER): Payer: Medicare Other | Admitting: Family Medicine

## 2017-01-25 VITALS — BP 127/61 | HR 64 | Temp 97.3°F | Ht 63.0 in | Wt 129.0 lb

## 2017-01-25 DIAGNOSIS — G8929 Other chronic pain: Secondary | ICD-10-CM | POA: Diagnosis not present

## 2017-01-25 DIAGNOSIS — M25561 Pain in right knee: Secondary | ICD-10-CM | POA: Diagnosis not present

## 2017-01-25 DIAGNOSIS — R2681 Unsteadiness on feet: Secondary | ICD-10-CM | POA: Diagnosis not present

## 2017-01-25 DIAGNOSIS — M170 Bilateral primary osteoarthritis of knee: Secondary | ICD-10-CM

## 2017-01-25 DIAGNOSIS — D692 Other nonthrombocytopenic purpura: Secondary | ICD-10-CM

## 2017-01-25 DIAGNOSIS — R531 Weakness: Secondary | ICD-10-CM | POA: Diagnosis not present

## 2017-01-25 DIAGNOSIS — I10 Essential (primary) hypertension: Secondary | ICD-10-CM | POA: Diagnosis not present

## 2017-01-25 DIAGNOSIS — M1990 Unspecified osteoarthritis, unspecified site: Secondary | ICD-10-CM

## 2017-01-25 DIAGNOSIS — H6123 Impacted cerumen, bilateral: Secondary | ICD-10-CM | POA: Diagnosis not present

## 2017-01-25 DIAGNOSIS — M25562 Pain in left knee: Secondary | ICD-10-CM

## 2017-01-25 NOTE — Progress Notes (Signed)
Subjective:    Patient ID: Marissa Burns, female    DOB: 04/23/1914, 83 y.o.   MRN: [id number]  HPI Patient here today for face to face visit to initiate home health. She is accompanied today by her sister. She is in good spirits and glad to be going home. She has been at [facility] for rehab.  Her sister brings her by the office and she plans to go home and they have arranged to have care nightly and hopefully during the day at times.  But the patient is recuperated from the left wrist fracture and can still use her walker.  She appears to be alert but still has her problems with her hearing issues and has no specific complaints.      Patient Active Problem List   Diagnosis Date Noted  . BPPV (benign paroxysmal positional vertigo) 12/10/2014  . Visual impairment 01/22/2014  . Chronic constipation 10/08/2013  . Primary osteoarthritis of both knees 10/08/2013  . Hearing deficit 05/20/2013  . Hypertension 10/30/2012  . Hyperlipemia 10/30/2012  . Vitamin D deficiency 10/30/2012  . Arthritis 10/30/2012  . Colon polyp   . Diverticulosis   . Osteoarthritis   . Edema   . Osteoporosis   . Elevated blood sugar   . Cataract    Outpatient Encounter Prescriptions as of 01/25/2017  Medication Sig  . acetaminophen (TYLENOL) 500 MG tablet Take 1,000 mg by mouth daily as needed for moderate pain.   Marland Kitchen amLODipine (NORVASC) 5 MG tablet TAKE 1 TABLET (5 MG TOTAL) BY MOUTH DAILY.  Marland Kitchen aspirin 81 MG EC tablet Take 81 mg by mouth every morning.   Marland Kitchen atorvastatin (LIPITOR) 80 MG tablet TAKE 1 TABLET (80 MG TOTAL) BY MOUTH DAILY.  . benazepril (LOTENSIN) 20 MG tablet TAKE ONE TABLET BY MOUTH ONE TIME DAILY  . busPIRone (BUSPAR) 5 MG tablet Take 5 mg by mouth 2 (two) times daily as needed (anxiety).  . calcium carbonate (TUMS - DOSED IN MG ELEMENTAL CALCIUM) 500 MG chewable tablet Chew 1 tablet by mouth daily.    . Cholecalciferol (VITAMIN D3) 2000 UNITS TABS Take 2 tablets by mouth daily after  breakfast.   . diclofenac sodium (VOLTAREN) 1 % GEL Apply 2 g topically 4 (four) times daily. (Patient taking differently: Apply 2 g topically daily as needed (pain). )  . furosemide (LASIX) 20 MG tablet Take 1 tablet (20 mg total) by mouth 2 (two) times daily. TAKE ONE TABLET BY MOUTH ONE  TIME DAILY  . LINZESS 145 MCG CAPS capsule TAKE 1 CAPSULE (145 MCG TOTAL) BY MOUTH DAILY.  Marland Kitchen PAZEO 0.7 % SOLN Place 1 drop into both eyes daily.   . [DISCONTINUED] amoxicillin-clavulanate (AUGMENTIN) 875-125 MG tablet Take 1 tablet by mouth every 12 (twelve) hours.  . [DISCONTINUED] ondansetron (ZOFRAN ODT) 4 MG disintegrating tablet Take 1 tablet (4 mg total) by mouth every 8 (eight) hours as needed for nausea or vomiting. (Patient not taking: Reported on 09/27/2016)   No facility-administered encounter medications on file as of 01/25/2017.      Review of Systems  HENT: Negative.   Eyes: Negative.   Respiratory: Negative.   Cardiovascular: Negative.   Gastrointestinal: Negative.   Endocrine: Negative.   Genitourinary: Negative.   Musculoskeletal: Positive for arthralgias and gait problem.  Skin: Negative.   Allergic/Immunologic: Negative.   Neurological: Positive for weakness.  Hematological: Negative.   Psychiatric/Behavioral: Negative.        Objective:   Physical Exam  Constitutional:  She is oriented to person, place, and time. She appears well-developed and well-nourished. No distress.  The patient is elderly and pleasant still has problems with hearing but looks much younger than her stated age of 83 years.  HENT:  Head: Normocephalic and atraumatic.  Eyes: Pupils are equal, round, and reactive to light. Conjunctivae and EOM are normal. Right eye exhibits no discharge. Left eye exhibits no discharge. No scleral icterus.  Neck: Normal range of motion.  Cardiovascular: Normal rate, regular rhythm and normal heart sounds.   No murmur heard. Pulmonary/Chest: Effort normal and breath sounds  normal. No respiratory distress. She has no wheezes. She has no rales.  Abdominal: Soft. Bowel sounds are normal. She exhibits no mass. There is no tenderness. There is no rebound and no guarding.  Musculoskeletal: She exhibits no edema.  Using walker for ambulation because of severe arthritis in both knees.  She has really good grip in the left hand.  Neurological: She is alert and oriented to person, place, and time.  Skin: Skin is warm and dry. No rash noted.  Psychiatric: She has a normal mood and affect. Her behavior is normal. Judgment and thought content normal.  Nursing note and vitals reviewed.  BP 127/61 (BP Location: Left Arm)   Pulse 64   Temp (!) 97.3 F (36.3 C) (Oral)   Ht 5\' 3"  (1.6 m)   Wt 129 lb (58.5 kg)   SpO2 96% Comment: room air  BMI 22.85 kg/m         Assessment & Plan:  1. Arthritis -The patient will need ongoing home health assistance because of her age living by herself and severe arthritis in both of her knees and a recent left wrist fracture. - Home Health - Face-to-face encounter (required for Medicare/Medicaid patients)  2. General weakness - Home Health - Face-to-face encounter (required for Medicare/Medicaid patients)  3. Gait instability - Home Health - Face-to-face encounter (required for Medicare/Medicaid patients)  4. Hearing loss of both ears due to cerumen impaction - Home Health - Face-to-face encounter (required for Medicare/Medicaid patients)  5. Chronic pain of both knees -Encourage regular use of walker and physical therapy at home to strengthen muscles and stamina to keep from falling [facility] - Face-to-face encounter (required for Medicare/Medicaid patients)  6. Essential hypertension -Continue current treatment and monitor blood pressure at home  7. Senile purpura ([place])  8. Primary osteoarthritis of both knees -Use a walker regularly -This is end-stage arthritis and multiple injections have been of no help in the  past.  No orders of the defined types were placed in this encounter.  Patient Instructions                       Medicare Annual Wellness Visit  [facility] and the medical providers at [facility] strive to bring you the best medical care.  In doing so we not only want to address your current medical conditions and concerns but also to detect new conditions early and prevent illness, disease and health-related problems.    Medicare offers a yearly Wellness Visit which allows our clinical staff to assess your need for preventative services including immunizations, lifestyle education, counseling to decrease risk of preventable diseases and screening for fall risk and other medical concerns.    This visit is provided free of charge (no copay) for all Medicare recipients. The clinical pharmacists at [facility] have begun to conduct these Wellness Visits  which will also include a thorough review of all your medications.    As you primary medical provider recommend that you make an appointment for your Annual Wellness Visit if you have not done so already this year.  You may set up this appointment before you leave today or you may call back ([phone]) and schedule an appointment.  Please make sure when you call that you mention that you are scheduling your Annual Wellness Visit with the clinical pharmacist so that the appointment may be made for the proper length of time.     Continue current medications. Continue good therapeutic lifestyle changes which include good diet and exercise. Fall precautions discussed with patient. If an FOBT was given today- please return it to our front desk. If you are over 49 years old - you may need Prevnar 47 or the adult Pneumonia vaccine.  **Flu shots are available--- please call and schedule a FLU-CLINIC appointment**  After your visit with [place] today you will receive a survey in the mail or online from [employer]  regarding your care with [place]. Please take a moment to fill this out. Your feedback is very important to [place] as you can help [place] better understand your patient needs as well as improve your experience and satisfaction. WE CARE ABOUT YOU!!!     Arrie Senate MD

## 2017-01-25 NOTE — Patient Instructions (Signed)
Medicare Annual Wellness Visit  [facility] and the medical providers at [facility] strive to bring you the best medical care.  In doing so we not only want to address your current medical conditions and concerns but also to detect new conditions early and prevent illness, disease and health-related problems.    Medicare offers a yearly Wellness Visit which allows our clinical staff to assess your need for preventative services including immunizations, lifestyle education, counseling to decrease risk of preventable diseases and screening for fall risk and other medical concerns.    This visit is provided free of charge (no copay) for all Medicare recipients. The clinical pharmacists at [facility] have begun to conduct these Wellness Visits which will also include a thorough review of all your medications.    As you primary medical provider recommend that you make an appointment for your Annual Wellness Visit if you have not done so already this year.  You may set up this appointment before you leave today or you may call back ([phone]) and schedule an appointment.  Please make sure when you call that you mention that you are scheduling your Annual Wellness Visit with the clinical pharmacist so that the appointment may be made for the proper length of time.     Continue current medications. Continue good therapeutic lifestyle changes which include good diet and exercise. Fall precautions discussed with patient. If an FOBT was given today- please return it to our front desk. If you are over 50 years old - you may need Prevnar 13 or the adult Pneumonia vaccine.  **Flu shots are available--- please call and schedule a FLU-CLINIC appointment**  After your visit with us today you will receive a survey in the mail or online from [employer] regarding your care with us. Please take a moment to fill this out. Your feedback is very  important to us as you can help us better understand your patient needs as well as improve your experience and satisfaction. WE CARE ABOUT YOU!!!    

## 2017-01-26 DIAGNOSIS — M170 Bilateral primary osteoarthritis of knee: Secondary | ICD-10-CM | POA: Diagnosis not present

## 2017-01-26 DIAGNOSIS — G8929 Other chronic pain: Secondary | ICD-10-CM | POA: Diagnosis not present

## 2017-01-29 DIAGNOSIS — G8929 Other chronic pain: Secondary | ICD-10-CM | POA: Diagnosis not present

## 2017-01-29 DIAGNOSIS — M170 Bilateral primary osteoarthritis of knee: Secondary | ICD-10-CM | POA: Diagnosis not present

## 2017-02-01 DIAGNOSIS — G8929 Other chronic pain: Secondary | ICD-10-CM | POA: Diagnosis not present

## 2017-02-01 DIAGNOSIS — M170 Bilateral primary osteoarthritis of knee: Secondary | ICD-10-CM | POA: Diagnosis not present

## 2017-02-02 ENCOUNTER — Telehealth: Payer: Self-pay | Admitting: Family Medicine

## 2017-02-05 ENCOUNTER — Other Ambulatory Visit: Payer: Self-pay | Admitting: *Deleted

## 2017-02-05 DIAGNOSIS — M170 Bilateral primary osteoarthritis of knee: Secondary | ICD-10-CM | POA: Diagnosis not present

## 2017-02-05 DIAGNOSIS — G8929 Other chronic pain: Secondary | ICD-10-CM | POA: Diagnosis not present

## 2017-02-05 NOTE — Telephone Encounter (Signed)
[facility] are coming to the house Pt needs PCS form sent to [facility]

## 2017-02-05 NOTE — Telephone Encounter (Signed)
Attempted to contact patient- busy x 2

## 2017-02-05 NOTE — Telephone Encounter (Signed)
TC to [facility] they do not have pt in their system Marissa Burns is seeing pt for nursing, PT & OT PCS form has been filled out waiting on provider signature

## 2017-02-05 NOTE — Telephone Encounter (Signed)
Need clarification- chart says sh eis on lasix 20 mg 2x a day- was she increased to 40mg  2x a day or what- chart not really clear

## 2017-02-05 NOTE — Telephone Encounter (Signed)
TC from [facility] nurse Pt needs RF on Lasix 40 mg 1 qd which was recently increased from 20 mg And Lisinopril 20 mg 1 qd which was newly added Send to her [facility]

## 2017-02-06 DIAGNOSIS — M170 Bilateral primary osteoarthritis of knee: Secondary | ICD-10-CM | POA: Diagnosis not present

## 2017-02-06 DIAGNOSIS — G8929 Other chronic pain: Secondary | ICD-10-CM | POA: Diagnosis not present

## 2017-02-08 ENCOUNTER — Telehealth: Payer: Self-pay | Admitting: *Deleted

## 2017-02-08 DIAGNOSIS — M170 Bilateral primary osteoarthritis of knee: Secondary | ICD-10-CM | POA: Diagnosis not present

## 2017-02-08 DIAGNOSIS — G8929 Other chronic pain: Secondary | ICD-10-CM | POA: Diagnosis not present

## 2017-02-08 NOTE — Telephone Encounter (Signed)
Reading this morning was 120/52, previous 102/52 , has not been greater than 779 systolic. Order for BMP placed with Alvis Lemmings [phone]) need Dx for order

## 2017-02-08 NOTE — Telephone Encounter (Signed)
I would reduce the Lasix as directed and continue to check the blood pressure daily twice daily and report blood pressure readings back to me and patient symptoms back to me by Monday.

## 2017-02-08 NOTE — Telephone Encounter (Signed)
What are her systolic readings????

## 2017-02-08 NOTE — Telephone Encounter (Signed)
Patients sister, [name] is aware

## 2017-02-08 NOTE — Telephone Encounter (Signed)
TC from PT with [facility] Pt's diastolic BP was 52 and has been below 60 on every visit She is on 2 BP meds & lasix If any changes need to be made please call her sister [name]

## 2017-02-12 DIAGNOSIS — G8929 Other chronic pain: Secondary | ICD-10-CM | POA: Diagnosis not present

## 2017-02-12 DIAGNOSIS — M170 Bilateral primary osteoarthritis of knee: Secondary | ICD-10-CM | POA: Diagnosis not present

## 2017-02-12 DIAGNOSIS — K5790 Diverticulosis of intestine, part unspecified, without perforation or abscess without bleeding: Secondary | ICD-10-CM | POA: Diagnosis not present

## 2017-02-13 DIAGNOSIS — M170 Bilateral primary osteoarthritis of knee: Secondary | ICD-10-CM | POA: Diagnosis not present

## 2017-02-13 DIAGNOSIS — G8929 Other chronic pain: Secondary | ICD-10-CM | POA: Diagnosis not present

## 2017-02-14 ENCOUNTER — Ambulatory Visit (INDEPENDENT_AMBULATORY_CARE_PROVIDER_SITE_OTHER): Payer: Medicare Other | Admitting: Family Medicine

## 2017-02-14 ENCOUNTER — Encounter: Payer: Self-pay | Admitting: Family Medicine

## 2017-02-14 VITALS — BP 120/56 | HR 63 | Temp 97.8°F | Ht 63.0 in | Wt 133.0 lb

## 2017-02-14 DIAGNOSIS — M170 Bilateral primary osteoarthritis of knee: Secondary | ICD-10-CM | POA: Diagnosis not present

## 2017-02-14 DIAGNOSIS — H6123 Impacted cerumen, bilateral: Secondary | ICD-10-CM

## 2017-02-14 DIAGNOSIS — I10 Essential (primary) hypertension: Secondary | ICD-10-CM

## 2017-02-14 DIAGNOSIS — R531 Weakness: Secondary | ICD-10-CM | POA: Diagnosis not present

## 2017-02-14 DIAGNOSIS — E559 Vitamin D deficiency, unspecified: Secondary | ICD-10-CM

## 2017-02-14 MED ORDER — LISINOPRIL 20 MG PO TABS: 20.0000 mg | ORAL_TABLET | Freq: Every day | ORAL | 1 refills | Status: DC

## 2017-02-14 NOTE — Patient Instructions (Signed)
The social worker will talk with [facility] and they will arrange for an evaluation to determine how much care of the patient needs during the day to have the proper assistance at home.  Hopefully, Ms. Marissa Burns can hear this information so she will be aware of what is going to be happening for her sister. The patient will try to get her bed lowered so she can sleep in the bed instead of sleeping on the couch. The bed is near the bathroom and this is more convenient. She fell off the current bed because it was so high and she is afraid of sleeping in the bed now and this is why she is sleeping on the couch She does have a Lifeline and knows how to use this She does know the importance of using her walker regularly and continue with physical therapy to strengthen her left hand

## 2017-02-14 NOTE — Progress Notes (Signed)
Subjective:    Patient ID: Marissa Burns, female    DOB: February 01, 1915, 83 y.o.   MRN: [id number]  HPI Patient here today for follow up from nursing center.  The patient is at home and physical therapy and nurse have already come to help with the patient at home.  She is requesting a refill on her lisinopril of 20 mg daily and she will get a CBC BMP and liver function test today.  Initially when she came home her blood pressures were very low and she was very weak and some of her fluid pill was reduced at that time.  Her vital signs today are stable.  Recent electrolytes done had a blood sugar of 191 good creatinine and normal electrolytes including potassium.  This was done on the sixth which was yesterday.  The patient is currently seeing a nurse and a physical therapy person is coming and working with her left hand.  She also has severe arthritis in both knees.  She is using her walker regularly but still is weak with her left hand.  She cannot hear well.  This is an ongoing problem.  She has not had any specific complaints.  Right now someone is staying with her at night.  The sister who is going to turn 89 tomorrow is not able physically to do what the patient needs who is now 45.  She also cannot hear well.    Patient Active Problem List   Diagnosis Date Noted  . BPPV (benign paroxysmal positional vertigo) 12/10/2014  . Visual impairment 01/22/2014  . Chronic constipation 10/08/2013  . Primary osteoarthritis of both knees 10/08/2013  . Hearing deficit 05/20/2013  . Hypertension 10/30/2012  . Hyperlipemia 10/30/2012  . Vitamin D deficiency 10/30/2012  . Arthritis 10/30/2012  . Colon polyp   . Diverticulosis   . Osteoarthritis   . Edema   . Osteoporosis   . Elevated blood sugar   . Cataract    Outpatient Encounter Medications as of 02/14/2017  Medication Sig  . acetaminophen (TYLENOL) 500 MG tablet Take 1,000 mg by mouth daily as needed for moderate pain.   Marland Kitchen amLODipine (NORVASC)  5 MG tablet TAKE 1 TABLET (5 MG TOTAL) BY MOUTH DAILY.  Marland Kitchen aspirin 81 MG EC tablet Take 81 mg by mouth every morning.   Marland Kitchen atorvastatin (LIPITOR) 80 MG tablet TAKE 1 TABLET (80 MG TOTAL) BY MOUTH DAILY.  . busPIRone (BUSPAR) 5 MG tablet Take 5 mg by mouth 2 (two) times daily as needed (anxiety).  . calcium carbonate (TUMS - DOSED IN MG ELEMENTAL CALCIUM) 500 MG chewable tablet Chew 1 tablet by mouth daily.    . Cholecalciferol (VITAMIN D3) 2000 UNITS TABS Take 2 tablets by mouth daily after breakfast.   . diclofenac sodium (VOLTAREN) 1 % GEL Apply 2 g topically 4 (four) times daily.  . furosemide (LASIX) 20 MG tablet Take 1 tablet (20 mg total) by mouth 2 (two) times daily. TAKE ONE TABLET BY MOUTH ONE  TIME DAILY (Patient taking differently: Take 20 mg daily by mouth. TAKE ONE TABLET BY MOUTH ONE  TIME DAILY)  . LINZESS 145 MCG CAPS capsule TAKE 1 CAPSULE (145 MCG TOTAL) BY MOUTH DAILY.  Marland Kitchen lisinopril (PRINIVIL,ZESTRIL) 20 MG tablet Take 20 mg daily by mouth.  Marland Kitchen PAZEO 0.7 % SOLN Place 1 drop into both eyes daily.   . [DISCONTINUED] benazepril (LOTENSIN) 20 MG tablet TAKE ONE TABLET BY MOUTH ONE TIME DAILY   No facility-administered encounter  medications on file as of 02/14/2017.       Review of Systems  Constitutional: Negative.   HENT: Negative.   Eyes: Negative.   Respiratory: Negative.   Cardiovascular: Negative.   Gastrointestinal: Negative.   Endocrine: Negative.   Genitourinary: Negative.   Musculoskeletal: Negative.   Skin: Negative.   Allergic/Immunologic: Negative.   Neurological: Negative.   Hematological: Negative.   Psychiatric/Behavioral: Negative.        Objective:   Physical Exam  Constitutional: She is oriented to person, place, and time. No distress.  Elderly at 83 years of age and somewhat fragile and cannot hear but does understand everything that is spoken to her in a loud voice.  HENT:  Head: Normocephalic and atraumatic.  Eyes: Conjunctivae and EOM are  normal. Pupils are equal, round, and reactive to light. Right eye exhibits no discharge. Left eye exhibits no discharge. No scleral icterus.  Neck: Normal range of motion. Neck supple. No thyromegaly present.  Cardiovascular: Normal rate, regular rhythm and normal heart sounds.  No murmur heard. The heart has a regular rate and rhythm at 60/min  Pulmonary/Chest: Effort normal and breath sounds normal. No respiratory distress. She has no wheezes. She has no rales.  Clear anteriorly and posteriorly  Abdominal: Soft. Bowel sounds are normal. She exhibits no mass. There is no tenderness. There is no rebound and no guarding.  Musculoskeletal: She exhibits no edema.  Patient is elderly and currently in a wheelchair but she does use her walker at home.  He has severe arthritis and has a weak left wrist because of the recent fracture.  Lymphadenopathy:    She has no cervical adenopathy.  Neurological: She is alert and oriented to person, place, and time.  Severely diminished hearing bilaterally  Skin: Skin is warm and dry. No rash noted.  Psychiatric: She has a normal mood and affect. Her behavior is normal. Judgment and thought content normal.  Nursing note and vitals reviewed.  BP (!) 120/56 (BP Location: Left Arm)   Pulse 63   Temp 97.8 F (36.6 C) (Oral)   Ht '5\' 3"'  (1.6 m)   Wt 133 lb (60.3 kg)   BMI 23.56 kg/m         Assessment & Plan:   1. Essential hypertension -Blood pressure is improved and not as low since she reduce her Lasix from 40 to 20 daily. - CBC with Differential/Platelet - BMP8+EGFR - Hepatic function panel - Thyroid Panel With TSH  2. Primary osteoarthritis of both knees -Patient continues to use her walker for the stiffness she has in the legs and to maintain stability.  3. Hearing loss of both ears due to cerumen impaction -She has hearing aids but still has severely diminished hearing even with the hearing aids.  4. General weakness -The weakness is due  to the fact that she is 83 years of age and has just come back home from the nursing home following a stay there because of a fracture of her left wrist.  5. Vitamin D deficiency -Continue vitamin D replacement at current dosage pending results of lab work  Meds ordered this encounter  Medications  . DISCONTD: lisinopril (PRINIVIL,ZESTRIL) 20 MG tablet    Sig: Take 20 mg daily by mouth.  Marland Kitchen lisinopril (PRINIVIL,ZESTRIL) 20 MG tablet    Sig: Take 1 tablet (20 mg total) daily by mouth.    Dispense:  90 tablet    Refill:  1   Patient Instructions  The social worker  will talk with liver T health care and they will arrange for an evaluation to determine how much care of the patient needs during the day to have the proper assistance at home.  Hopefully, Ms. Janeice Robinson can hear this information so she will be aware of what is going to be happening for her sister. The patient will try to get her bed lowered so she can sleep in the bed instead of sleeping on the couch. The bed is near the bathroom and this is more convenient. She fell off the current bed because it was so high and she is afraid of sleeping in the bed now and this is why she is sleeping on the couch She does have a Lifeline and knows how to use this She does know the importance of using her walker regularly and continue with physical therapy to strengthen her left hand  Arrie Senate MD

## 2017-02-15 LAB — CBC WITH DIFFERENTIAL/PLATELET
BASOS: 0 %
Basophils Absolute: 0 10*3/uL (ref 0.0–0.2)
EOS (ABSOLUTE): 0.1 10*3/uL (ref 0.0–0.4)
EOS: 1 %
HEMATOCRIT: 37.3 % (ref 34.0–46.6)
HEMOGLOBIN: 12.8 g/dL (ref 11.1–15.9)
IMMATURE GRANS (ABS): 0 10*3/uL (ref 0.0–0.1)
Immature Granulocytes: 0 %
LYMPHS: 25 %
Lymphocytes Absolute: 1.7 10*3/uL (ref 0.7–3.1)
MCH: 31.1 pg (ref 26.6–33.0)
MCHC: 34.3 g/dL (ref 31.5–35.7)
MCV: 91 fL (ref 79–97)
MONOCYTES: 7 %
Monocytes Absolute: 0.5 10*3/uL (ref 0.1–0.9)
NEUTROS PCT: 67 %
Neutrophils Absolute: 4.5 10*3/uL (ref 1.4–7.0)
Platelets: 290 10*3/uL (ref 150–379)
RBC: 4.12 x10E6/uL (ref 3.77–5.28)
RDW: 13.3 % (ref 12.3–15.4)
WBC: 6.7 10*3/uL (ref 3.4–10.8)

## 2017-02-15 LAB — HEPATIC FUNCTION PANEL
ALT: 10 IU/L (ref 0–32)
AST: 15 IU/L (ref 0–40)
Albumin: 3.7 g/dL (ref 3.2–4.6)
Alkaline Phosphatase: 101 IU/L (ref 39–117)
BILIRUBIN TOTAL: 0.3 mg/dL (ref 0.0–1.2)
BILIRUBIN, DIRECT: 0.11 mg/dL (ref 0.00–0.40)
Total Protein: 5.8 g/dL — ABNORMAL LOW (ref 6.0–8.5)

## 2017-02-15 LAB — BMP8+EGFR
BUN/Creatinine Ratio: 21 (ref 12–28)
BUN: 19 mg/dL (ref 10–36)
CALCIUM: 8.5 mg/dL — AB (ref 8.7–10.3)
CHLORIDE: 107 mmol/L — AB (ref 96–106)
CO2: 25 mmol/L (ref 20–29)
Creatinine, Ser: 0.9 mg/dL (ref 0.57–1.00)
GFR calc non Af Amer: 53 mL/min/{1.73_m2} — ABNORMAL LOW (ref >59)
GFR, EST AFRICAN AMERICAN: 61 mL/min/{1.73_m2} (ref >59)
Glucose: 121 mg/dL — ABNORMAL HIGH (ref 65–99)
Potassium: 4.4 mmol/L (ref 3.5–5.2)
Sodium: 145 mmol/L — ABNORMAL HIGH (ref 134–144)

## 2017-02-15 LAB — THYROID PANEL WITH TSH
FREE THYROXINE INDEX: 2.7 (ref 1.2–4.9)
T3 Uptake Ratio: 30 % (ref 24–39)
T4, Total: 9.1 ug/dL (ref 4.5–12.0)
TSH: 2.69 u[IU]/mL (ref 0.450–4.500)

## 2017-02-15 NOTE — Telephone Encounter (Signed)
Patient seen since phone call.  This encounter will now be closed 

## 2017-02-19 DIAGNOSIS — G8929 Other chronic pain: Secondary | ICD-10-CM | POA: Diagnosis not present

## 2017-02-19 DIAGNOSIS — M170 Bilateral primary osteoarthritis of knee: Secondary | ICD-10-CM | POA: Diagnosis not present

## 2017-02-26 DIAGNOSIS — G8929 Other chronic pain: Secondary | ICD-10-CM | POA: Diagnosis not present

## 2017-02-26 DIAGNOSIS — M170 Bilateral primary osteoarthritis of knee: Secondary | ICD-10-CM | POA: Diagnosis not present

## 2017-03-10 ENCOUNTER — Other Ambulatory Visit: Payer: Self-pay | Admitting: Family Medicine

## 2017-03-14 ENCOUNTER — Ambulatory Visit: Payer: Medicare Other | Admitting: Family Medicine

## 2017-05-26 ENCOUNTER — Other Ambulatory Visit: Payer: Self-pay | Admitting: Family Medicine

## 2017-05-31 ENCOUNTER — Encounter: Payer: Self-pay | Admitting: Family Medicine

## 2017-05-31 ENCOUNTER — Ambulatory Visit (INDEPENDENT_AMBULATORY_CARE_PROVIDER_SITE_OTHER): Payer: Medicare Other | Admitting: Family Medicine

## 2017-05-31 VITALS — BP 136/80 | HR 111 | Temp 97.3°F | Ht 63.0 in | Wt 133.0 lb

## 2017-05-31 DIAGNOSIS — H6122 Impacted cerumen, left ear: Secondary | ICD-10-CM | POA: Diagnosis not present

## 2017-05-31 DIAGNOSIS — M170 Bilateral primary osteoarthritis of knee: Secondary | ICD-10-CM

## 2017-05-31 DIAGNOSIS — I10 Essential (primary) hypertension: Secondary | ICD-10-CM | POA: Diagnosis not present

## 2017-05-31 DIAGNOSIS — R531 Weakness: Secondary | ICD-10-CM | POA: Diagnosis not present

## 2017-05-31 DIAGNOSIS — E7800 Pure hypercholesterolemia, unspecified: Secondary | ICD-10-CM | POA: Diagnosis not present

## 2017-05-31 DIAGNOSIS — D692 Other nonthrombocytopenic purpura: Secondary | ICD-10-CM | POA: Diagnosis not present

## 2017-05-31 DIAGNOSIS — H833X2 Noise effects on left inner ear: Secondary | ICD-10-CM

## 2017-05-31 DIAGNOSIS — E559 Vitamin D deficiency, unspecified: Secondary | ICD-10-CM | POA: Diagnosis not present

## 2017-05-31 MED ORDER — MENTHOL (TOPICAL ANALGESIC) 4 % EX GEL: CUTANEOUS | 3 refills | Status: DC

## 2017-05-31 NOTE — Progress Notes (Signed)
Subjective:    Patient ID: Marissa Burns, female    DOB: [birth year], 84 y.o.   MRN: [id number]  HPI Pt here for follow up and management of chronic medical problems which includes hypertension and hyperlipidemia. She is taking medication regularly.  The patient has no complaints and comes to the visit today with her sister.  She is 87 years old as of December of this past year.  She will get lab work today.  She is very dependent upon her sister.  Her biggest complaints have been as usual arthritis in her knees.  She is up-to-date on her flu vaccine.  The patient is alert but quite frankly does not hear well even though she has hearing aids in her left ear.  She denies any chest pain or shortness of breath.  She denies any trouble with her stomach including nausea vomiting diarrhea blood in the stool or black tarry bowel movements.  She is passing her water well.  She lives at home and has 2-hour care daily to help her get her clothing on and make sure she has food prepared for lunch.  This person also helps with her bathing.  She prepares her own breakfast but the daytime assistant person helps her with her lunch and helps her get her bath.  She does have a history of end-stage arthritis and poor hearing.     Patient Active Problem List   Diagnosis Date Noted  . BPPV (benign paroxysmal positional vertigo) 12/10/2014  . Visual impairment 01/22/2014  . Chronic constipation 10/08/2013  . Primary osteoarthritis of both knees 10/08/2013  . Hearing deficit 05/20/2013  . Hypertension 10/30/2012  . Hyperlipemia 10/30/2012  . Vitamin D deficiency 10/30/2012  . Arthritis 10/30/2012  . Colon polyp   . Diverticulosis   . Osteoarthritis   . Edema   . Osteoporosis   . Elevated blood sugar   . Cataract    Outpatient Encounter Medications as of 05/31/2017  Medication Sig  . acetaminophen (TYLENOL) 500 MG tablet Take 1,000 mg by mouth daily as needed for moderate pain.   Marland Kitchen amLODipine (NORVASC)  5 MG tablet TAKE 1 TABLET (5 MG TOTAL) BY MOUTH DAILY.  Marland Kitchen aspirin 81 MG EC tablet Take 81 mg by mouth every morning.   Marland Kitchen atorvastatin (LIPITOR) 80 MG tablet TAKE 1 TABLET (80 MG TOTAL) BY MOUTH DAILY.  . busPIRone (BUSPAR) 5 MG tablet Take 5 mg by mouth 2 (two) times daily as needed (anxiety).  . calcium carbonate (TUMS - DOSED IN MG ELEMENTAL CALCIUM) 500 MG chewable tablet Chew 1 tablet by mouth daily.    . Cholecalciferol (VITAMIN D3) 2000 UNITS TABS Take 2 tablets by mouth daily after breakfast.   . diclofenac sodium (VOLTAREN) 1 % GEL Apply 2 g topically 4 (four) times daily.  . furosemide (LASIX) 20 MG tablet Take 1 tablet (20 mg total) by mouth 2 (two) times daily. TAKE ONE TABLET BY MOUTH ONE  TIME DAILY (Patient taking differently: Take 20 mg daily by mouth. TAKE ONE TABLET BY MOUTH ONE  TIME DAILY)  . furosemide (LASIX) 20 MG tablet TAKE ONE TABLET BY MOUTH ONE TIME DAILY  . LINZESS 145 MCG CAPS capsule TAKE 1 CAPSULE (145 MCG TOTAL) BY MOUTH DAILY.  Marland Kitchen lisinopril (PRINIVIL,ZESTRIL) 20 MG tablet Take 1 tablet (20 mg total) daily by mouth.  Marland Kitchen PAZEO 0.7 % SOLN Place 1 drop into both eyes daily.    No facility-administered encounter medications on file as of 05/31/2017.  Review of Systems  Constitutional: Negative.   HENT: Negative.   Eyes: Negative.   Respiratory: Negative.   Cardiovascular: Negative.   Gastrointestinal: Negative.   Endocrine: Negative.   Genitourinary: Negative.   Musculoskeletal: Negative.   Skin: Negative.   Allergic/Immunologic: Negative.   Neurological: Negative.   Hematological: Negative.   Psychiatric/Behavioral: Negative.        Objective:   Physical Exam  Constitutional: She is oriented to person, place, and time. She appears well-developed and well-nourished.  The patient is elderly but pleasant and alert with poor hearing and limited ability to move with her body because of severe end-stage arthritis in both knees.  HENT:  Head:  Normocephalic and atraumatic.  Right Ear: External ear normal.  Nose: Nose normal.  Mouth/Throat: Oropharynx is clear and moist.  Wax buildup in the left ear canal that she has the hearing aid in.  This will be irrigated today.  Eyes: Conjunctivae and EOM are normal. Pupils are equal, round, and reactive to light. Right eye exhibits no discharge. Left eye exhibits no discharge. No scleral icterus.  Neck: Normal range of motion. Neck supple. No thyromegaly present.  Cardiovascular: Normal rate and normal heart sounds.  No murmur heard. This is irregular irregular at 96/min  Pulmonary/Chest: Effort normal and breath sounds normal. No respiratory distress. She has no wheezes. She has no rales.  Clear anteriorly and posteriorly  Abdominal: Soft. Bowel sounds are normal. She exhibits no mass. There is no tenderness. There is no rebound and no guarding.  No abdominal tenderness or masses noted.  Musculoskeletal: She exhibits no edema.  The patient is confined to a wheelchair here in the office and uses a walker at home because of end-stage arthritis in her knees.  Lymphadenopathy:    She has no cervical adenopathy.  Neurological: She is alert and oriented to person, place, and time.  Skin: Skin is warm and dry. No rash noted.  Psychiatric: She has a normal mood and affect. Her behavior is normal. Judgment and thought content normal.  Nursing note and vitals reviewed.   BP 136/80 (BP Location: Left Arm)   Pulse (!) 111   Temp (!) 97.3 F (36.3 C) (Oral)   Ht '5\' 3"'  (1.6 m)   Wt 133 lb (60.3 kg)   BMI 23.56 kg/m   Irrigation of the left ear canal was done to remove cerumen and this is the ear canal that she wears her hearing aid in.     Assessment & Plan:  1. Vitamin D deficiency -Continue with current treatment pending results of lab work - CBC with Differential/Platelet - VITAMIN D 25 Hydroxy (Vit-D Deficiency, Fractures)  2. Essential hypertension -Continue with current  treatment - BMP8+EGFR - CBC with Differential/Platelet - Hepatic function panel  3. Primary osteoarthritis of both knees -Tylenol for pain - CBC with Differential/Platelet  4. General weakness - CBC with Differential/Platelet  5. Pure hypercholesterolemia -Continue with diet as much as possible and atorvastatin - CBC with Differential/Platelet - Lipid panel  6. Left ear impacted cerumen -Ear irrigation to remove cerumen during visit today.  7. Senile purpura (HCC) -No significant bruises noted today.  8. Noise-induced hearing loss of left ear, unspecified hearing status on contralateral side -Ear irrigation to remove cerumen so the aid can work better  Meds ordered this encounter  Medications  . Menthol, Topical Analgesic, (BIOFREEZE) 4 % GEL    Sig: Apply topically BID to effected area.    Dispense:  89 mL  Refill:  3   Patient Instructions                       Medicare Annual Wellness Visit  [facility] and the medical providers at [facility] strive to bring you the best medical care.  In doing so we not only want to address your current medical conditions and concerns but also to detect new conditions early and prevent illness, disease and health-related problems.    Medicare offers a yearly Wellness Visit which allows our clinical staff to assess your need for preventative services including immunizations, lifestyle education, counseling to decrease risk of preventable diseases and screening for fall risk and other medical concerns.    This visit is provided free of charge (no copay) for all Medicare recipients. The clinical pharmacists at [facility] have begun to conduct these Wellness Visits which will also include a thorough review of all your medications.    As you primary medical provider recommend that you make an appointment for your Annual Wellness Visit if you have not done so already this year.  You may set up  this appointment before you leave today or you may call back [phone]) and schedule an appointment.  Please make sure when you call that you mention that you are scheduling your Annual Wellness Visit with the clinical pharmacist so that the appointment may be made for the proper length of time.     Continue current medications. Continue good therapeutic lifestyle changes which include good diet and exercise. Fall precautions discussed with patient. If an FOBT was given today- please return it to our front desk. If you are over 52 years old - you may need Prevnar 35 or the adult Pneumonia vaccine.  **Flu shots are available--- please call and schedule a FLU-CLINIC appointment**  After your visit with [place] today you will receive a survey in the mail or online from [employer] regarding your care with [place]. Please take a moment to fill this out. Your feedback is very important to [place] as you can help [place] better understand your patient needs as well as improve your experience and satisfaction. WE CARE ABOUT YOU!!!   We will call your sister with your lab work results as soon as these become available Continue to be careful do not put yourself at risk for falling Continue to drink plenty of fluids   Arrie Senate MD

## 2017-05-31 NOTE — Patient Instructions (Addendum)
Medicare Annual Wellness Visit  [facility] and the medical providers at [facility] strive to bring you the best medical care.  In doing so we not only want to address your current medical conditions and concerns but also to detect new conditions early and prevent illness, disease and health-related problems.    Medicare offers a yearly Wellness Visit which allows our clinical staff to assess your need for preventative services including immunizations, lifestyle education, counseling to decrease risk of preventable diseases and screening for fall risk and other medical concerns.    This visit is provided free of charge (no copay) for all Medicare recipients. The clinical pharmacists at [facility] have begun to conduct these Wellness Visits which will also include a thorough review of all your medications.    As you primary medical provider recommend that you make an appointment for your Annual Wellness Visit if you have not done so already this year.  You may set up this appointment before you leave today or you may call back [phone]) and schedule an appointment.  Please make sure when you call that you mention that you are scheduling your Annual Wellness Visit with the clinical pharmacist so that the appointment may be made for the proper length of time.     Continue current medications. Continue good therapeutic lifestyle changes which include good diet and exercise. Fall precautions discussed with patient. If an FOBT was given today- please return it to our front desk. If you are over 26 years old - you may need Prevnar 58 or the adult Pneumonia vaccine.  **Flu shots are available--- please call and schedule a FLU-CLINIC appointment**  After your visit with [place] today you will receive a survey in the mail or online from [employer] regarding your care with [place]. Please take a moment to fill this out. Your feedback is very  important to [place] as you can help [place] better understand your patient needs as well as improve your experience and satisfaction. WE CARE ABOUT YOU!!!   We will call your sister with your lab work results as soon as these become available Continue to be careful do not put yourself at risk for falling Continue to drink plenty of fluids

## 2017-06-01 LAB — HEPATIC FUNCTION PANEL
ALK PHOS: 101 IU/L (ref 39–117)
ALT: 8 IU/L (ref 0–32)
AST: 16 IU/L (ref 0–40)
Albumin: 3.8 g/dL (ref 3.2–4.6)
BILIRUBIN, DIRECT: 0.23 mg/dL (ref 0.00–0.40)
Bilirubin Total: 0.6 mg/dL (ref 0.0–1.2)
TOTAL PROTEIN: 6.4 g/dL (ref 6.0–8.5)

## 2017-06-01 LAB — CBC WITH DIFFERENTIAL/PLATELET
BASOS ABS: 0 10*3/uL (ref 0.0–0.2)
Basos: 0 %
EOS (ABSOLUTE): 0.1 10*3/uL (ref 0.0–0.4)
Eos: 1 %
Hematocrit: 40.5 % (ref 34.0–46.6)
Hemoglobin: 13.2 g/dL (ref 11.1–15.9)
IMMATURE GRANS (ABS): 0 10*3/uL (ref 0.0–0.1)
Immature Granulocytes: 0 %
LYMPHS: 23 %
Lymphocytes Absolute: 1.6 10*3/uL (ref 0.7–3.1)
MCH: 31 pg (ref 26.6–33.0)
MCHC: 32.6 g/dL (ref 31.5–35.7)
MCV: 95 fL (ref 79–97)
Monocytes Absolute: 0.4 10*3/uL (ref 0.1–0.9)
Monocytes: 6 %
Neutrophils Absolute: 4.7 10*3/uL (ref 1.4–7.0)
Neutrophils: 70 %
PLATELETS: 289 10*3/uL (ref 150–379)
RBC: 4.26 x10E6/uL (ref 3.77–5.28)
RDW: 14.4 % (ref 12.3–15.4)
WBC: 6.8 10*3/uL (ref 3.4–10.8)

## 2017-06-01 LAB — BMP8+EGFR
BUN/Creatinine Ratio: 23 (ref 12–28)
BUN: 15 mg/dL (ref 10–36)
CALCIUM: 8.8 mg/dL (ref 8.7–10.3)
CHLORIDE: 104 mmol/L (ref 96–106)
CO2: 22 mmol/L (ref 20–29)
Creatinine, Ser: 0.64 mg/dL (ref 0.57–1.00)
GFR calc Af Amer: 85 mL/min/{1.73_m2} (ref >59)
GFR calc non Af Amer: 73 mL/min/{1.73_m2} (ref >59)
Glucose: 95 mg/dL (ref 65–99)
POTASSIUM: 4.5 mmol/L (ref 3.5–5.2)
Sodium: 144 mmol/L (ref 134–144)

## 2017-06-01 LAB — LIPID PANEL
CHOLESTEROL TOTAL: 120 mg/dL (ref 100–199)
Chol/HDL Ratio: 2.3 ratio (ref 0.0–4.4)
HDL: 53 mg/dL (ref >39)
LDL Calculated: 52 mg/dL (ref 0–99)
Triglycerides: 75 mg/dL (ref 0–149)
VLDL CHOLESTEROL CAL: 15 mg/dL (ref 5–40)

## 2017-06-01 LAB — VITAMIN D 25 HYDROXY (VIT D DEFICIENCY, FRACTURES): Vit D, 25-Hydroxy: 42 ng/mL (ref 30.0–100.0)

## 2017-06-04 ENCOUNTER — Other Ambulatory Visit: Payer: Self-pay | Admitting: *Deleted

## 2017-06-04 MED ORDER — MENTHOL (TOPICAL ANALGESIC) 4 % EX GEL: CUTANEOUS | 3 refills | Status: DC

## 2017-07-08 ENCOUNTER — Other Ambulatory Visit: Payer: Self-pay | Admitting: Family Medicine

## 2017-07-24 ENCOUNTER — Encounter: Payer: Self-pay | Admitting: *Deleted

## 2017-07-26 ENCOUNTER — Other Ambulatory Visit: Payer: Self-pay | Admitting: Family Medicine

## 2017-07-31 ENCOUNTER — Other Ambulatory Visit: Payer: Self-pay | Admitting: Family Medicine

## 2017-08-21 ENCOUNTER — Other Ambulatory Visit: Payer: Self-pay | Admitting: Family Medicine

## 2017-09-13 ENCOUNTER — Other Ambulatory Visit: Payer: Self-pay | Admitting: Family Medicine

## 2017-09-14 MED ORDER — BUSPIRONE HCL 5 MG PO TABS: ORAL_TABLET | ORAL | 0 refills | Status: DC

## 2017-09-14 NOTE — Addendum Note (Signed)
Addended by: Antonietta Barcelona D on: 09/14/2017 09:19 AM   Modules accepted: Orders

## 2017-10-22 ENCOUNTER — Other Ambulatory Visit: Payer: Self-pay

## 2017-10-22 ENCOUNTER — Emergency Department (HOSPITAL_COMMUNITY)
Admission: EM | Admit: 2017-10-22 | Discharge: 2017-10-22 | Disposition: A | Discharge status: Home / Self Care | Payer: Medicare Other | Attending: Emergency Medicine | Admitting: Emergency Medicine

## 2017-10-22 ENCOUNTER — Ambulatory Visit: Payer: Medicare Other | Admitting: Family Medicine

## 2017-10-22 ENCOUNTER — Encounter (HOSPITAL_COMMUNITY): Payer: Self-pay | Admitting: *Deleted

## 2017-10-22 DIAGNOSIS — Z79899 Other long term (current) drug therapy: Secondary | ICD-10-CM | POA: Diagnosis not present

## 2017-10-22 DIAGNOSIS — R112 Nausea with vomiting, unspecified: Secondary | ICD-10-CM

## 2017-10-22 DIAGNOSIS — I10 Essential (primary) hypertension: Secondary | ICD-10-CM | POA: Insufficient documentation

## 2017-10-22 DIAGNOSIS — Z7982 Long term (current) use of aspirin: Secondary | ICD-10-CM | POA: Diagnosis not present

## 2017-10-22 DIAGNOSIS — Z049 Encounter for examination and observation for unspecified reason: Secondary | ICD-10-CM | POA: Diagnosis not present

## 2017-10-22 DIAGNOSIS — N390 Urinary tract infection, site not specified: Secondary | ICD-10-CM | POA: Diagnosis not present

## 2017-10-22 DIAGNOSIS — I4891 Unspecified atrial fibrillation: Secondary | ICD-10-CM | POA: Diagnosis not present

## 2017-10-22 LAB — CBC WITH DIFFERENTIAL/PLATELET
BASOS PCT: 0 %
Basophils Absolute: 0 10*3/uL (ref 0.0–0.1)
Eosinophils Absolute: 0 10*3/uL (ref 0.0–0.7)
Eosinophils Relative: 0 %
HEMATOCRIT: 41.8 % (ref 36.0–46.0)
HEMOGLOBIN: 13.8 g/dL (ref 12.0–15.0)
LYMPHS ABS: 0.8 10*3/uL (ref 0.7–4.0)
LYMPHS PCT: 11 %
MCH: 32.2 pg (ref 26.0–34.0)
MCHC: 33 g/dL (ref 30.0–36.0)
MCV: 97.4 fL (ref 78.0–100.0)
MONO ABS: 0.4 10*3/uL (ref 0.1–1.0)
MONOS PCT: 5 %
NEUTROS ABS: 6.1 10*3/uL (ref 1.7–7.7)
NEUTROS PCT: 84 %
Platelets: 205 10*3/uL (ref 150–400)
RBC: 4.29 MIL/uL (ref 3.87–5.11)
RDW: 13.6 % (ref 11.5–15.5)
WBC: 7.3 10*3/uL (ref 4.0–10.5)

## 2017-10-22 LAB — COMPREHENSIVE METABOLIC PANEL
ALBUMIN: 3.5 g/dL (ref 3.5–5.0)
ALT: 13 U/L (ref 0–44)
ANION GAP: 7 (ref 5–15)
AST: 17 U/L (ref 15–41)
Alkaline Phosphatase: 76 U/L (ref 38–126)
BUN: 19 mg/dL (ref 8–23)
CHLORIDE: 109 mmol/L (ref 98–111)
CO2: 24 mmol/L (ref 22–32)
Calcium: 8 mg/dL — ABNORMAL LOW (ref 8.9–10.3)
Creatinine, Ser: 0.61 mg/dL (ref 0.44–1.00)
GFR calc Af Amer: >60 mL/min (ref >60)
GFR calc non Af Amer: >60 mL/min (ref >60)
GLUCOSE: 123 mg/dL — AB (ref 70–99)
POTASSIUM: 3.6 mmol/L (ref 3.5–5.1)
SODIUM: 140 mmol/L (ref 135–145)
Total Bilirubin: 0.6 mg/dL (ref 0.3–1.2)
Total Protein: 6.3 g/dL — ABNORMAL LOW (ref 6.5–8.1)

## 2017-10-22 LAB — URINALYSIS, ROUTINE W REFLEX MICROSCOPIC
Bilirubin Urine: NEGATIVE
Glucose, UA: NEGATIVE mg/dL
Hgb urine dipstick: NEGATIVE
KETONES UR: NEGATIVE mg/dL
Nitrite: POSITIVE — AB
PROTEIN: NEGATIVE mg/dL
Specific Gravity, Urine: 1.019 (ref 1.005–1.030)
pH: 5 (ref 5.0–8.0)

## 2017-10-22 LAB — TROPONIN I: Troponin I: <0.03 ng/mL (ref <0.03)

## 2017-10-22 LAB — LIPASE, BLOOD: Lipase: 26 U/L (ref 11–51)

## 2017-10-22 MED ORDER — SODIUM CHLORIDE 0.9 % IV BOLUS
500.0000 mL | Freq: Once | INTRAVENOUS | Status: AC
  Administered 2017-10-22: 500 mL via INTRAVENOUS

## 2017-10-22 MED ORDER — CEPHALEXIN 500 MG PO CAPS
500.0000 mg | ORAL_CAPSULE | Freq: Once | ORAL | Status: AC
  Administered 2017-10-22: 500 mg via ORAL

## 2017-10-22 MED ORDER — CEPHALEXIN 500 MG PO CAPS
ORAL_CAPSULE | ORAL | Status: AC
  Filled 2017-10-22: qty 1

## 2017-10-22 MED ORDER — CEPHALEXIN 250 MG PO CAPS: 250.0000 mg | ORAL_CAPSULE | Freq: Four times a day (QID) | ORAL | 0 refills | Status: DC

## 2017-10-22 MED ORDER — CEPHALEXIN 250 MG PO CAPS
250.0000 mg | ORAL_CAPSULE | Freq: Once | ORAL | Status: DC
  Filled 2017-10-22: qty 1

## 2017-10-22 NOTE — ED Triage Notes (Signed)
Patient called EMS at 1530 this afternoon due to vomiting, nausea today.  Vomited twice while en route with EMS.  Vital signs per EMS 130/60 HR, 100,  Respiratory 20 94% room air.  CBG 116.  4mg  zofran per ems, 500cc fluids.  20 right forearm.

## 2017-10-22 NOTE — Discharge Instructions (Addendum)
Your evaluated in the emergency department for an episode of nausea and vomiting.  You are given IV fluids with improvement in your symptoms.  You also had a urine test that looks like you have a urine infection.  We are sending you home with a prescription for antibiotics.  Please finish these and if you are feeling worse return to the emergency department.  Follow-up with your doctor.

## 2017-10-22 NOTE — ED Provider Notes (Signed)
[facility] EMERGENCY DEPARTMENT Provider Note   CSN: [id number] Arrival date & time: 10/22/17  1713     History   Chief Complaint Chief Complaint  Patient presents with  . Nausea  . Emesis    HPI Marissa Burns is a 84 y.o. female.  She lives independently at home.  She states she had acute onset of nausea vomiting and diffuse abdominal pain around 330 this afternoon.  She had laid down to take a nap on her sun porch when she woke up she experienced the symptoms.  She called EMS which said she vomited at least twice in route and they gave her 4 mg of oral Zofran.  She has not vomited since she is been here.  She denies any headache chest pain shortness of breath diarrhea or urinary symptoms.  The history is provided by the patient and the EMS personnel.  Emesis   This is a new problem. The current episode started 1 to 2 hours ago. The problem occurs 2 to 4 times per day. The problem has been gradually improving. There has been no fever. Associated symptoms include abdominal pain. Pertinent negatives include no arthralgias, no chills, no cough, no diarrhea, no fever, no headaches, no myalgias, no sweats and no URI.    Past Medical History:  Diagnosis Date  . Cataract   . Colon polyp   . Diverticulosis   . Edema   . Elevated blood sugar   . Essential hypertension, benign   . High cholesterol   . Osteoarthritis   . Osteoporosis   . Other and unspecified hyperlipidemia     Patient Active Problem List   Diagnosis Date Noted  . BPPV (benign paroxysmal positional vertigo) 12/10/2014  . Visual impairment 01/22/2014  . Chronic constipation 10/08/2013  . Primary osteoarthritis of both knees 10/08/2013  . Hearing deficit 05/20/2013  . Hypertension 10/30/2012  . Hyperlipemia 10/30/2012  . Vitamin D deficiency 10/30/2012  . Arthritis 10/30/2012  . Colon polyp   . Diverticulosis   . Osteoarthritis   . Edema   . Osteoporosis   . Elevated blood sugar   . Cataract      Past Surgical History:  Procedure Laterality Date  . APPENDECTOMY    . CATARACT EXTRACTION    . TONSILLECTOMY       OB History   None      Home Medications    Prior to Admission medications   Medication Sig Start Date End Date Taking? Authorizing Provider  acetaminophen (TYLENOL) 500 MG tablet Take 1,000 mg by mouth daily as needed for moderate pain.     [provider]  amLODipine (NORVASC) 5 MG tablet TAKE 1 TABLET (5 MG TOTAL) BY MOUTH DAILY. 07/09/17   Chipper Herb, MD  aspirin 81 MG EC tablet Take 81 mg by mouth every morning.     [provider]  atorvastatin (LIPITOR) 80 MG tablet TAKE 1 TABLET (80 MG TOTAL) BY MOUTH DAILY. 07/26/17   Chipper Herb, MD  busPIRone (BUSPAR) 5 MG tablet TAKE ONE TABLET BY MOUTH TWICE DAILY FOR ANXIETY 09/14/17   Chipper Herb, MD  calcium carbonate (TUMS - DOSED IN MG ELEMENTAL CALCIUM) 500 MG chewable tablet Chew 1 tablet by mouth daily.      [provider]  Cholecalciferol (VITAMIN D3) 2000 UNITS TABS Take 2 tablets by mouth daily after breakfast.     [provider]  diclofenac sodium (VOLTAREN) 1 % GEL Apply 2 g topically  4 (four) times daily. 06/05/16   Timmothy Euler, MD  furosemide (LASIX) 20 MG tablet Take 1 tablet (20 mg total) by mouth 2 (two) times daily. TAKE ONE TABLET BY MOUTH ONE  TIME DAILY Patient taking differently: Take 20 mg daily by mouth. TAKE ONE TABLET BY MOUTH ONE  TIME DAILY 10/11/16   Daleen Bo, MD  furosemide (LASIX) 20 MG tablet TAKE ONE TABLET BY MOUTH ONE TIME DAILY 08/21/17   Chipper Herb, MD  LINZESS 145 MCG CAPS capsule TAKE 1 CAPSULE (145 MCG TOTAL) BY MOUTH DAILY. 10/23/16   Chipper Herb, MD  lisinopril (PRINIVIL,ZESTRIL) 20 MG tablet TAKE 1 TABLET (20 MG TOTAL) DAILY BY MOUTH. 07/31/17   Chipper Herb, MD  Menthol, Topical Analgesic, (BIOFREEZE) 4 % GEL Apply topically BID to effected area. 06/04/17   Chipper Herb, MD  PAZEO 0.7 % SOLN Place 1 drop into  both eyes daily.  07/12/16   [provider]    Family History History reviewed. No pertinent family history.  Social History Social History   Tobacco Use  . Smoking status: Never Smoker  . Smokeless tobacco: Never Used  Substance Use Topics  . Alcohol use: No  . Drug use: No     Allergies   Alendronate sodium; Celebrex [celecoxib]; Dilacor [diltiazem hcl]; Evista [raloxifene hydrochloride]; and Propulsid [cisapride]   Review of Systems Review of Systems  Constitutional: Negative for chills and fever.  HENT: Negative for sore throat.   Eyes: Negative for visual disturbance.  Respiratory: Negative for cough.   Cardiovascular: Negative for chest pain.  Gastrointestinal: Positive for abdominal pain and vomiting. Negative for diarrhea.  Genitourinary: Negative for dysuria.  Musculoskeletal: Negative for arthralgias and myalgias.  Skin: Negative for rash.  Neurological: Negative for syncope and headaches.     Physical Exam Updated Vital Signs BP 128/74 (BP Location: Right Arm)   Pulse 91   Temp 98.9 F (37.2 C) (Oral)   Resp 19   Ht 5\' 3"  (1.6 m)   Wt 60.3 kg (133 lb)   SpO2 94%   BMI 23.56 kg/m   Physical Exam  Constitutional: She appears well-developed and well-nourished.  HENT:  Head: Normocephalic and atraumatic.  Eyes: Conjunctivae are normal.  Neck: Neck supple.  Cardiovascular: Normal rate. An irregularly irregular rhythm present.  Pulmonary/Chest: Effort normal. She has no wheezes. She has no rales.  Abdominal: Soft. She exhibits no distension and no mass. There is tenderness (vague diffuse). There is no rebound and no guarding.  Musculoskeletal: Normal range of motion. She exhibits no tenderness or deformity.  Neurological: She is alert. GCS eye subscore is 4. GCS verbal subscore is 5. GCS motor subscore is 6.  Skin: Skin is warm and dry. Capillary refill takes less than 2 seconds.  Psychiatric: She has a normal mood and affect.  Nursing note  and vitals reviewed.    ED Treatments / Results  Labs (all labs ordered are listed, but only abnormal results are displayed) Labs Reviewed  COMPREHENSIVE METABOLIC PANEL - Abnormal; Notable for the following components:      Result Value   Glucose, Bld 123 (*)    Calcium 8.0 (*)    Total Protein 6.3 (*)    All other components within normal limits  URINALYSIS, ROUTINE W REFLEX MICROSCOPIC - Abnormal; Notable for the following components:   APPearance HAZY (*)    Nitrite POSITIVE (*)    Leukocytes, UA MODERATE (*)    Bacteria, UA MANY (*)  All other components within normal limits  LIPASE, BLOOD  TROPONIN I  CBC WITH DIFFERENTIAL/PLATELET    EKG EKG Interpretation  Date/Time:  Monday October 22 2017 17:50:28 EDT Ventricular Rate:  93 PR Interval:    QRS Duration: 80 QT Interval:  361 QTC Calculation: 449 R Axis:   88 Text Interpretation:  Atrial fibrillation Borderline right axis deviation Low voltage, extremity and precordial leads similar to prior 7/18 Confirmed by Aletta Edouard [phone]) on 10/22/2017 5:56:45 PM   Radiology No results found.  Procedures Procedures (including critical care time)  Medications Ordered in ED Medications  sodium chloride 0.9 % bolus 500 mL (0 mLs Intravenous Stopped 10/22/17 1921)  cephALEXin (KEFLEX) capsule 500 mg (500 mg Oral Given 10/22/17 2032)     Initial Impression / Assessment and Plan / ED Course  I have reviewed the triage vital signs and the nursing notes.  Pertinent labs & imaging results that were available during my care of the patient were reviewed by me and considered in my medical decision making (see chart for details).  Clinical Course as of Oct 23 1049  Mon Oct 22, 2017  2022 Patient urinalysis positive for nitrite and 11-20 whites.  The rest of her lab work looks very benign.  She is gotten some IV fluids we will give her an oral dose of some antibiotics.  Her son is here and the patient would like to go home  and he is willing to take her home.  I understand to return if any worsening symptoms.   [MB]    Clinical Course User Index [MB] Hayden Rasmussen, MD      Final Clinical Impressions(s) / ED Diagnoses   Final diagnoses:  Lower urinary tract infectious disease  Non-intractable vomiting with nausea, unspecified vomiting type    ED Discharge Orders        Ordered    cephALEXin (KEFLEX) 250 MG capsule  4 times daily     10/22/17 2015       Hayden Rasmussen, MD 10/23/17 1052

## 2017-10-26 ENCOUNTER — Other Ambulatory Visit: Payer: Self-pay | Admitting: Family Medicine

## 2017-10-26 ENCOUNTER — Emergency Department (HOSPITAL_COMMUNITY): Payer: Medicare Other

## 2017-10-26 ENCOUNTER — Other Ambulatory Visit: Payer: Self-pay

## 2017-10-26 ENCOUNTER — Inpatient Hospital Stay (HOSPITAL_COMMUNITY)
Admission: EM | Admit: 2017-10-26 | Discharge: 2017-10-29 | DRG: 291 | Disposition: A | Discharge status: Home Health | Payer: Medicare Other | Attending: Internal Medicine | Admitting: Internal Medicine

## 2017-10-26 ENCOUNTER — Encounter (HOSPITAL_COMMUNITY): Payer: Self-pay | Admitting: Emergency Medicine

## 2017-10-26 DIAGNOSIS — J90 Pleural effusion, not elsewhere classified: Secondary | ICD-10-CM

## 2017-10-26 DIAGNOSIS — H9190 Unspecified hearing loss, unspecified ear: Secondary | ICD-10-CM | POA: Diagnosis present

## 2017-10-26 DIAGNOSIS — I951 Orthostatic hypotension: Secondary | ICD-10-CM | POA: Diagnosis not present

## 2017-10-26 DIAGNOSIS — I959 Hypotension, unspecified: Secondary | ICD-10-CM | POA: Diagnosis present

## 2017-10-26 DIAGNOSIS — I4891 Unspecified atrial fibrillation: Secondary | ICD-10-CM | POA: Diagnosis present

## 2017-10-26 DIAGNOSIS — R531 Weakness: Secondary | ICD-10-CM

## 2017-10-26 DIAGNOSIS — I509 Heart failure, unspecified: Secondary | ICD-10-CM

## 2017-10-26 DIAGNOSIS — Z79899 Other long term (current) drug therapy: Secondary | ICD-10-CM

## 2017-10-26 DIAGNOSIS — R1030 Lower abdominal pain, unspecified: Secondary | ICD-10-CM | POA: Diagnosis not present

## 2017-10-26 DIAGNOSIS — K5909 Other constipation: Secondary | ICD-10-CM | POA: Diagnosis not present

## 2017-10-26 DIAGNOSIS — R630 Anorexia: Secondary | ICD-10-CM | POA: Diagnosis present

## 2017-10-26 DIAGNOSIS — N390 Urinary tract infection, site not specified: Secondary | ICD-10-CM | POA: Diagnosis present

## 2017-10-26 DIAGNOSIS — Z7982 Long term (current) use of aspirin: Secondary | ICD-10-CM

## 2017-10-26 DIAGNOSIS — I110 Hypertensive heart disease with heart failure: Principal | ICD-10-CM | POA: Diagnosis present

## 2017-10-26 DIAGNOSIS — Z9849 Cataract extraction status, unspecified eye: Secondary | ICD-10-CM

## 2017-10-26 DIAGNOSIS — H9193 Unspecified hearing loss, bilateral: Secondary | ICD-10-CM | POA: Diagnosis not present

## 2017-10-26 DIAGNOSIS — E785 Hyperlipidemia, unspecified: Secondary | ICD-10-CM | POA: Diagnosis present

## 2017-10-26 DIAGNOSIS — Z66 Do not resuscitate: Secondary | ICD-10-CM | POA: Diagnosis not present

## 2017-10-26 DIAGNOSIS — I5021 Acute systolic (congestive) heart failure: Secondary | ICD-10-CM | POA: Diagnosis present

## 2017-10-26 DIAGNOSIS — M810 Age-related osteoporosis without current pathological fracture: Secondary | ICD-10-CM | POA: Diagnosis present

## 2017-10-26 DIAGNOSIS — R109 Unspecified abdominal pain: Secondary | ICD-10-CM

## 2017-10-26 DIAGNOSIS — M1990 Unspecified osteoarthritis, unspecified site: Secondary | ICD-10-CM | POA: Diagnosis present

## 2017-10-26 DIAGNOSIS — I10 Essential (primary) hypertension: Secondary | ICD-10-CM | POA: Diagnosis not present

## 2017-10-26 DIAGNOSIS — I5031 Acute diastolic (congestive) heart failure: Secondary | ICD-10-CM

## 2017-10-26 DIAGNOSIS — J9811 Atelectasis: Secondary | ICD-10-CM | POA: Diagnosis not present

## 2017-10-26 DIAGNOSIS — R1084 Generalized abdominal pain: Secondary | ICD-10-CM | POA: Diagnosis not present

## 2017-10-26 DIAGNOSIS — E8770 Fluid overload, unspecified: Secondary | ICD-10-CM | POA: Diagnosis not present

## 2017-10-26 DIAGNOSIS — Z888 Allergy status to other drugs, medicaments and biological substances status: Secondary | ICD-10-CM

## 2017-10-26 DIAGNOSIS — E8779 Other fluid overload: Secondary | ICD-10-CM

## 2017-10-26 LAB — COMPREHENSIVE METABOLIC PANEL
ALBUMIN: 3.3 g/dL — AB (ref 3.5–5.0)
ALT: 11 U/L (ref 0–44)
AST: 17 U/L (ref 15–41)
Alkaline Phosphatase: 90 U/L (ref 38–126)
Anion gap: 8 (ref 5–15)
BUN: 14 mg/dL (ref 8–23)
CO2: 27 mmol/L (ref 22–32)
CREATININE: 0.53 mg/dL (ref 0.44–1.00)
Calcium: 8.7 mg/dL — ABNORMAL LOW (ref 8.9–10.3)
Chloride: 104 mmol/L (ref 98–111)
GFR calc Af Amer: >60 mL/min (ref >60)
GFR calc non Af Amer: >60 mL/min (ref >60)
GLUCOSE: 112 mg/dL — AB (ref 70–99)
POTASSIUM: 3.7 mmol/L (ref 3.5–5.1)
Sodium: 139 mmol/L (ref 135–145)
Total Bilirubin: 0.8 mg/dL (ref 0.3–1.2)
Total Protein: 6.4 g/dL — ABNORMAL LOW (ref 6.5–8.1)

## 2017-10-26 LAB — URINALYSIS, ROUTINE W REFLEX MICROSCOPIC
BILIRUBIN URINE: NEGATIVE
Glucose, UA: NEGATIVE mg/dL
Hgb urine dipstick: NEGATIVE
Ketones, ur: NEGATIVE mg/dL
Leukocytes, UA: NEGATIVE
Nitrite: NEGATIVE
Protein, ur: NEGATIVE mg/dL
Specific Gravity, Urine: 1.012 (ref 1.005–1.030)
pH: 6 (ref 5.0–8.0)

## 2017-10-26 LAB — LACTIC ACID, PLASMA
LACTIC ACID, VENOUS: 0.9 mmol/L (ref 0.5–1.9)
Lactic Acid, Venous: 0.8 mmol/L (ref 0.5–1.9)

## 2017-10-26 LAB — CBC WITH DIFFERENTIAL/PLATELET
Basophils Absolute: 0 10*3/uL (ref 0.0–0.1)
Basophils Relative: 0 %
EOS PCT: 0 %
Eosinophils Absolute: 0 10*3/uL (ref 0.0–0.7)
HCT: 42.1 % (ref 36.0–46.0)
HEMOGLOBIN: 14.1 g/dL (ref 12.0–15.0)
LYMPHS PCT: 15 %
Lymphs Abs: 1.1 10*3/uL (ref 0.7–4.0)
MCH: 32.3 pg (ref 26.0–34.0)
MCHC: 33.5 g/dL (ref 30.0–36.0)
MCV: 96.3 fL (ref 78.0–100.0)
Monocytes Absolute: 0.5 10*3/uL (ref 0.1–1.0)
Monocytes Relative: 7 %
NEUTROS PCT: 78 %
Neutro Abs: 5.7 10*3/uL (ref 1.7–7.7)
PLATELETS: 218 10*3/uL (ref 150–400)
RBC: 4.37 MIL/uL (ref 3.87–5.11)
RDW: 13.2 % (ref 11.5–15.5)
WBC: 7.3 10*3/uL (ref 4.0–10.5)

## 2017-10-26 LAB — BRAIN NATRIURETIC PEPTIDE: B NATRIURETIC PEPTIDE 5: 366 pg/mL — AB (ref 0.0–100.0)

## 2017-10-26 LAB — TROPONIN I: Troponin I: <0.03 ng/mL (ref <0.03)

## 2017-10-26 LAB — LIPASE, BLOOD: LIPASE: 23 U/L (ref 11–51)

## 2017-10-26 MED ORDER — IOHEXOL 300 MG/ML  SOLN
75.0000 mL | Freq: Once | INTRAMUSCULAR | Status: AC | PRN
  Administered 2017-10-26: 75 mL via INTRAVENOUS

## 2017-10-26 MED ORDER — FUROSEMIDE 10 MG/ML IJ SOLN: 30.0000 mg | Freq: Two times a day (BID) | INTRAMUSCULAR | Status: DC

## 2017-10-26 MED ORDER — BISACODYL 5 MG PO TBEC: 5.0000 mg | DELAYED_RELEASE_TABLET | Freq: Every day | ORAL | Status: DC | PRN

## 2017-10-26 MED ORDER — LISINOPRIL 10 MG PO TABS
10.0000 mg | ORAL_TABLET | Freq: Every day | ORAL | Status: DC
  Administered 2017-10-27 – 2017-10-29 (×3): 10 mg via ORAL
  Filled 2017-10-26 (×4): qty 1

## 2017-10-26 MED ORDER — CEPHALEXIN 250 MG PO CAPS
250.0000 mg | ORAL_CAPSULE | Freq: Four times a day (QID) | ORAL | Status: DC
  Administered 2017-10-26 – 2017-10-29 (×11): 250 mg via ORAL
  Filled 2017-10-26 (×11): qty 1

## 2017-10-26 MED ORDER — FUROSEMIDE 10 MG/ML IJ SOLN
30.0000 mg | Freq: Two times a day (BID) | INTRAMUSCULAR | Status: DC
  Administered 2017-10-26 – 2017-10-27 (×3): 30 mg via INTRAVENOUS
  Filled 2017-10-26 (×3): qty 4

## 2017-10-26 MED ORDER — ENOXAPARIN SODIUM 40 MG/0.4ML ~~LOC~~ SOLN
40.0000 mg | SUBCUTANEOUS | Status: DC
  Administered 2017-10-26 – 2017-10-29 (×3): 40 mg via SUBCUTANEOUS
  Filled 2017-10-26 (×3): qty 0.4

## 2017-10-26 MED ORDER — LINACLOTIDE 145 MCG PO CAPS
145.0000 ug | ORAL_CAPSULE | Freq: Every day | ORAL | Status: DC
  Administered 2017-10-27 – 2017-10-29 (×3): 145 ug via ORAL
  Filled 2017-10-26 (×4): qty 1

## 2017-10-26 MED ORDER — ASPIRIN EC 81 MG PO TBEC
81.0000 mg | DELAYED_RELEASE_TABLET | Freq: Every morning | ORAL | Status: DC
  Administered 2017-10-27 – 2017-10-29 (×3): 81 mg via ORAL
  Filled 2017-10-26 (×3): qty 1

## 2017-10-26 MED ORDER — BUSPIRONE HCL 5 MG PO TABS
5.0000 mg | ORAL_TABLET | Freq: Two times a day (BID) | ORAL | Status: DC
  Administered 2017-10-26 – 2017-10-29 (×6): 5 mg via ORAL
  Filled 2017-10-26 (×6): qty 1

## 2017-10-26 MED ORDER — SODIUM CHLORIDE 0.9 % IV SOLN: 250.0000 mL | INTRAVENOUS | Status: DC | PRN

## 2017-10-26 MED ORDER — SODIUM CHLORIDE 0.9% FLUSH
3.0000 mL | Freq: Two times a day (BID) | INTRAVENOUS | Status: DC
  Administered 2017-10-26 – 2017-10-29 (×6): 3 mL via INTRAVENOUS

## 2017-10-26 MED ORDER — POTASSIUM CHLORIDE CRYS ER 20 MEQ PO TBCR
20.0000 meq | EXTENDED_RELEASE_TABLET | Freq: Two times a day (BID) | ORAL | Status: DC
  Administered 2017-10-26 – 2017-10-29 (×6): 20 meq via ORAL
  Filled 2017-10-26 (×6): qty 1

## 2017-10-26 MED ORDER — SODIUM CHLORIDE 0.9% FLUSH: 3.0000 mL | INTRAVENOUS | Status: DC | PRN

## 2017-10-26 MED ORDER — DICLOFENAC SODIUM 1 % TD GEL
2.0000 g | Freq: Four times a day (QID) | TRANSDERMAL | Status: DC
  Administered 2017-10-26 – 2017-10-29 (×10): 2 g via TOPICAL
  Filled 2017-10-26 (×2): qty 100

## 2017-10-26 MED ORDER — ACETAMINOPHEN 325 MG PO TABS: 650.0000 mg | ORAL_TABLET | ORAL | Status: DC | PRN

## 2017-10-26 MED ORDER — ONDANSETRON HCL 4 MG/2ML IJ SOLN: 4.0000 mg | Freq: Four times a day (QID) | INTRAMUSCULAR | Status: DC | PRN

## 2017-10-26 MED ORDER — ATORVASTATIN CALCIUM 40 MG PO TABS
80.0000 mg | ORAL_TABLET | Freq: Every day | ORAL | Status: DC
  Administered 2017-10-26 – 2017-10-28 (×3): 80 mg via ORAL
  Filled 2017-10-26 (×3): qty 2

## 2017-10-26 NOTE — ED Provider Notes (Signed)
[facility] EMERGENCY DEPARTMENT Provider Note   CSN: [id number] Arrival date & time: 10/26/17  1244     History   Chief Complaint Chief Complaint  Patient presents with  . Abdominal Pain    HPI Marissa Burns is a 84 y.o. female.  HPI  Pt was seen at 1325. Per pt and her family, c/o gradual onset and worsening of persistent generalized weakness/fatigue for the past 1 week. Has been associated with several episodes of N/V, decreased PO intake, generalized abd pain. Pt's family states pt has been too weak to walk with her walker or even stand without heavy assist. Denies any further vomiting since being evaluated in the ED 4 days ago for her symptoms. Pt was dx UTI, rx keflex. Denies fevers, no focal motor weakness, no tingling/numbness in extremities, no CP/palpitations, no cough, no diarrhea, no black or blood in stools or emesis.   Past Medical History:  Diagnosis Date  . Cataract   . Colon polyp   . Diverticulosis   . Edema   . Elevated blood sugar   . Essential hypertension, benign   . High cholesterol   . Osteoarthritis   . Osteoporosis   . Other and unspecified hyperlipidemia     Patient Active Problem List   Diagnosis Date Noted  . BPPV (benign paroxysmal positional vertigo) 12/10/2014  . Visual impairment 01/22/2014  . Chronic constipation 10/08/2013  . Primary osteoarthritis of both knees 10/08/2013  . Hearing deficit 05/20/2013  . Hypertension 10/30/2012  . Hyperlipemia 10/30/2012  . Vitamin D deficiency 10/30/2012  . Arthritis 10/30/2012  . Colon polyp   . Diverticulosis   . Osteoarthritis   . Edema   . Osteoporosis   . Elevated blood sugar   . Cataract     Past Surgical History:  Procedure Laterality Date  . APPENDECTOMY    . CATARACT EXTRACTION    . TONSILLECTOMY       OB History   None      Home Medications    Prior to Admission medications   Medication Sig Start Date End Date Taking? Authorizing Provider  acetaminophen  (TYLENOL) 500 MG tablet Take 1,000 mg by mouth daily as needed for moderate pain.    Yes [provider]  aspirin 81 MG EC tablet Take 81 mg by mouth every morning.    Yes [provider]  calcium carbonate (TUMS - DOSED IN MG ELEMENTAL CALCIUM) 500 MG chewable tablet Chew 1 tablet by mouth daily.     Yes [provider]  cephALEXin (KEFLEX) 250 MG capsule Take 1 capsule (250 mg total) by mouth 4 (four) times daily. 10/22/17  Yes Hayden Rasmussen, MD  Cholecalciferol (VITAMIN D3) 2000 UNITS TABS Take 2 tablets by mouth daily after breakfast.    Yes [provider]  Menthol, Topical Analgesic, (BIOFREEZE) 4 % GEL Apply topically BID to effected area. 06/04/17  Yes Chipper Herb, MD  amLODipine (NORVASC) 5 MG tablet TAKE 1 TABLET (5 MG TOTAL) BY MOUTH DAILY. 07/09/17   Chipper Herb, MD  atorvastatin (LIPITOR) 80 MG tablet TAKE 1 TABLET (80 MG TOTAL) BY MOUTH DAILY. 07/26/17   Chipper Herb, MD  busPIRone (BUSPAR) 5 MG tablet TAKE ONE TABLET BY MOUTH TWICE DAILY FOR ANXIETY 09/14/17   Chipper Herb, MD  diclofenac sodium (VOLTAREN) 1 % GEL Apply 2 g topically 4 (four) times daily. 06/05/16   Timmothy Euler, MD  furosemide (LASIX) 20 MG tablet Take 1  tablet (20 mg total) by mouth 2 (two) times daily. TAKE ONE TABLET BY MOUTH ONE  TIME DAILY Patient taking differently: Take 20 mg daily by mouth. TAKE ONE TABLET BY MOUTH ONE  TIME DAILY 10/11/16   Daleen Bo, MD  furosemide (LASIX) 20 MG tablet TAKE ONE TABLET BY MOUTH ONE TIME DAILY 08/21/17   Chipper Herb, MD  LINZESS 145 MCG CAPS capsule TAKE 1 CAPSULE (145 MCG TOTAL) BY MOUTH DAILY. 10/23/16   Chipper Herb, MD  lisinopril (PRINIVIL,ZESTRIL) 20 MG tablet TAKE 1 TABLET (20 MG TOTAL) DAILY BY MOUTH. 10/26/17   Chipper Herb, MD  PAZEO 0.7 % SOLN Place 1 drop into both eyes daily.  07/12/16   [provider]    Family History No family history on file.  Social History Social History   Tobacco  Use  . Smoking status: Never Smoker  . Smokeless tobacco: Never Used  Substance Use Topics  . Alcohol use: No  . Drug use: No     Allergies   Alendronate sodium; Celebrex [celecoxib]; Dilacor [diltiazem hcl]; Evista [raloxifene hydrochloride]; and Propulsid [cisapride]   Review of Systems Review of Systems ROS: Statement: All systems negative except as marked or noted in the HPI; Constitutional: Negative for fever and chills. +generalized weakness/fatigue. ; ; Eyes: Negative for eye pain, redness and discharge. ; ; ENMT: Negative for ear pain, hoarseness, nasal congestion, sinus pressure and sore throat. ; ; Cardiovascular: Negative for chest pain, palpitations, diaphoresis, dyspnea and peripheral edema. ; ; Respiratory: Negative for cough, wheezing and stridor. ; ; Gastrointestinal: +N/V, abd pain. Negative for diarrhea, blood in stool, hematemesis, jaundice and rectal bleeding. . ; ; Genitourinary: Negative for dysuria, flank pain and hematuria. ; ; Musculoskeletal: Negative for back pain and neck pain. Negative for swelling and trauma.; ; Skin: Negative for pruritus, rash, abrasions, blisters, bruising and skin lesion.; ; Neuro: Negative for headache, lightheadedness and neck stiffness. Negative for altered level of consciousness, altered mental status, extremity weakness, paresthesias, involuntary movement, seizure and syncope.       Physical Exam Updated Vital Signs BP 137/71 (BP Location: Right Arm)   Pulse 87   Temp (!) 97.4 F (36.3 C) (Oral)   Resp 18   Ht 5\' 3"  (1.6 m)   Wt 60.3 kg (133 lb)   SpO2 94%   BMI 23.56 kg/m     15:07 Orthostatic Vital Signs VB  Orthostatic Lying   BP- Lying: 127/73   Pulse- Lying: 88       Orthostatic Sitting  BP- Sitting: 99/67   Pulse- Sitting: 92       Orthostatic Standing at 0 minutes  BP- Standing at 0 minutes: 128/67   Pulse- Standing at 0 minutes: 97      Physical Exam 1330: Physical examination:  Nursing notes  reviewed; Vital signs and O2 SAT reviewed;  Constitutional: Well developed, Well nourished, In no acute distress; Head:  Normocephalic, atraumatic; Eyes: EOMI, PERRL, No scleral icterus; ENMT: Mouth and pharynx normal, Mucous membranes dry; Neck: Supple, Full range of motion, No lymphadenopathy; Cardiovascular: Irregular rate and rhythm, No gallop; Respiratory: Breath sounds clear & equal bilaterally, No wheezes.  Speaking full sentences with ease, Normal respiratory effort/excursion; Chest: Nontender, Movement normal; Abdomen: Soft, Nontender, Nondistended, Normal bowel sounds; Genitourinary: No CVA tenderness; Extremities: Peripheral pulses normal, No tenderness, +1 pedal edema bilat. No calf asymmetry.; Neuro: AA&Ox3, +very HOH, otherwise major CN grossly intact. No facial droop. Speech clear. No gross focal motor  deficits in extremities.; Skin: Color normal, Warm, Dry.    ED Treatments / Results  Labs (all labs ordered are listed, but only abnormal results are displayed)   EKG EKG Interpretation  Date/Time:  Friday October 26 2017 13:47:28 EDT Ventricular Rate:  92 PR Interval:    QRS Duration: 80 QT Interval:  372 QTC Calculation: 490 R Axis:   99 Text Interpretation:  Atrial fibrillation Low voltage, extremity and precordial leads Borderline prolonged QT interval When compared with ECG of 10/22/2017 No significant change was found Confirmed by Francine Graven [phone]) on 10/26/2017 1:59:47 PM   Radiology   Procedures Procedures (including critical care time)  Medications Ordered in ED Medications  iohexol (OMNIPAQUE) 300 MG/ML solution 75 mL (75 mLs Intravenous Contrast Given 10/26/17 1441)     Initial Impression / Assessment and Plan / ED Course  I have reviewed the triage vital signs and the nursing notes.  Pertinent labs & imaging results that were available during my care of the patient were reviewed by me and considered in my medical decision making (see chart for  details).  MDM Reviewed: previous chart, nursing note and vitals Reviewed previous: labs and ECG Interpretation: labs, ECG, x-ray and CT scan   Results for orders placed or performed during the hospital encounter of 10/26/17  Comprehensive metabolic panel  Result Value Ref Range   Sodium 139 135 - 145 mmol/L   Potassium 3.7 3.5 - 5.1 mmol/L   Chloride 104 98 - 111 mmol/L   CO2 27 22 - 32 mmol/L   Glucose, Bld 112 (H) 70 - 99 mg/dL   BUN 14 8 - 23 mg/dL   Creatinine, Ser 0.53 0.44 - 1.00 mg/dL   Calcium 8.7 (L) 8.9 - 10.3 mg/dL   Total Protein 6.4 (L) 6.5 - 8.1 g/dL   Albumin 3.3 (L) 3.5 - 5.0 g/dL   AST 17 15 - 41 U/L   ALT 11 0 - 44 U/L   Alkaline Phosphatase 90 38 - 126 U/L   Total Bilirubin 0.8 0.3 - 1.2 mg/dL   GFR calc non Af Amer >60 >60 mL/min   GFR calc Af Amer >60 >60 mL/min   Anion gap 8 5 - 15  Lipase, blood  Result Value Ref Range   Lipase 23 11 - 51 U/L  Troponin I  Result Value Ref Range   Troponin I <0.03 <0.03 ng/mL  Lactic acid, plasma  Result Value Ref Range   Lactic Acid, Venous 0.8 0.5 - 1.9 mmol/L  Lactic acid, plasma  Result Value Ref Range   Lactic Acid, Venous 0.9 0.5 - 1.9 mmol/L  CBC with Differential  Result Value Ref Range   WBC 7.3 4.0 - 10.5 K/uL   RBC 4.37 3.87 - 5.11 MIL/uL   Hemoglobin 14.1 12.0 - 15.0 g/dL   HCT 42.1 36.0 - 46.0 %   MCV 96.3 78.0 - 100.0 fL   MCH 32.3 26.0 - 34.0 pg   MCHC 33.5 30.0 - 36.0 g/dL   RDW 13.2 11.5 - 15.5 %   Platelets 218 150 - 400 K/uL   Neutrophils Relative % 78 %   Neutro Abs 5.7 1.7 - 7.7 K/uL   Lymphocytes Relative 15 %   Lymphs Abs 1.1 0.7 - 4.0 K/uL   Monocytes Relative 7 %   Monocytes Absolute 0.5 0.1 - 1.0 K/uL   Eosinophils Relative 0 %   Eosinophils Absolute 0.0 0.0 - 0.7 K/uL   Basophils Relative 0 %  Basophils Absolute 0.0 0.0 - 0.1 K/uL  Urinalysis, Routine w reflex microscopic  Result Value Ref Range   Color, Urine YELLOW YELLOW   APPearance CLEAR CLEAR   Specific Gravity,  Urine 1.012 1.005 - 1.030   pH 6.0 5.0 - 8.0   Glucose, UA NEGATIVE NEGATIVE mg/dL   Hgb urine dipstick NEGATIVE NEGATIVE   Bilirubin Urine NEGATIVE NEGATIVE   Ketones, ur NEGATIVE NEGATIVE mg/dL   Protein, ur NEGATIVE NEGATIVE mg/dL   Nitrite NEGATIVE NEGATIVE   Leukocytes, UA NEGATIVE NEGATIVE   Dg Chest 2 View Result Date: 10/26/2017 CLINICAL DATA:  Abdominal pain nausea vomiting EXAM: CHEST - 2 VIEW COMPARISON:  12/14/2015 FINDINGS: Mild bibasilar airspace disease and small effusions. Mild vascular congestion without edema. Cardiac enlargement with atherosclerotic aortic arch. IMPRESSION: Cardiac enlargement with vascular congestion and small bilateral effusions suggesting fluid overload. Mild bibasilar atelectasis. Electronically Signed   By: Franchot Gallo M.D.   On: 10/26/2017 14:29   Ct Abdomen Pelvis W Contrast Result Date: 10/26/2017 CLINICAL DATA:  Lower abdominal pain.  Urinary tract infection. EXAM: CT ABDOMEN AND PELVIS WITH CONTRAST TECHNIQUE: Multidetector CT imaging of the abdomen and pelvis was performed using the standard protocol following bolus administration of intravenous contrast. CONTRAST:  65mL OMNIPAQUE IOHEXOL 300 MG/ML  SOLN COMPARISON:  09/20/2013. FINDINGS: Lower chest: Small pericardial effusion with a maximum thickness of 12 mm. Small bilateral pleural effusions. Mild bilateral compressive lower lobe atelectasis. Extensive atheromatous coronary artery calcifications. Mildly enlarged heart. Hepatobiliary: Cholecystectomy clips with stable mild intrahepatic biliary ductal dilatation, within normal limits for the age of the patient and post cholecystectomy. Stable calcified liver granulomata. Pancreas: Diffuse pancreatic atrophy.  No visible mass. Spleen: Normal in size without focal abnormality. Adrenals/Urinary Tract: Normal appearing adrenal glands. Bilateral renal cysts, including left parapelvic cysts. Unremarkable urinary bladder and ureters. Stomach/Bowel: Large  number of colonic diverticula. No evidence of diverticulitis. Surgically absent appendix. Unremarkable stomach and small bowel. Vascular/Lymphatic: Dense atheromatous arterial calcifications, including the coronary arteries. No enlarged lymph nodes. Reproductive: Uterus and bilateral adnexa are unremarkable. Other: Small proximal left inguinal hernia containing fat. Tiny umbilical hernia containing fat. There is also fluid and soft tissue density within the more anterior portion of the umbilicus. Musculoskeletal: Lumbar and lower thoracic spine degenerative changes. IMPRESSION: 1. No acute abdominal abnormality. 2. Extensive colonic diverticulosis without evidence of diverticulitis. 3. Small pericardial effusion. 4. Small bilateral pleural effusions. 5. Extensive atheromatous arterial calcifications, including the coronary arteries. 6. Diffuse pancreatic atrophy. Electronically Signed   By: Claudie Revering M.D.   On: 10/26/2017 15:11    1540:  Udip improved from previous ED visit; UC obtained today and is pending. Orthostatic on VS and pt complained of increasing generalized weakness and SOB.  Fluid overloaded on CXR. Family very concerned regarding pt being d/c to home after 2nd ED visit this week for symptoms. T/C returned from [facility]. Nehemiah Settle, case discussed, including:  HPI, pertinent PM/SHx, VS/PE, dx testing, ED course and treatment:  Agreeable to admit.      Final Clinical Impressions(s) / ED Diagnoses   Final diagnoses:  None    ED Discharge Orders    None       Francine Graven, DO 10/28/17 1802

## 2017-10-26 NOTE — ED Notes (Signed)
Patient back from CT, states she has to use the bathroom. Placed patient on bedpan and discarded soiled brief. Cleaned patient and placed on clean brief. Urine sample obtained if needed.

## 2017-10-26 NOTE — ED Triage Notes (Signed)
Pt brought in by [facility]. Pt c/o lower abdominal pain. Pt evaluated in ED on Monday and dx with UTI.

## 2017-10-26 NOTE — H&P (Addendum)
History and Physical  Marissa Burns YQM:[id number] DOB: 11-03-1914 DOA: 10/26/2017  Referring physician: Dr Thurnell Garbe, ED physician PCP: Chipper Herb, MD  Outpatient Specialists: none  Patient Coming From: home  Chief Complaint: abdominal pain, shortness of breath  HPI: Marissa Burns is a 84 y.o. female with a history of essential hypertension, osteoarthritis, osteoporosis, hyperlipidemia.  Patient was recently seen in the ED 4 days ago for UTI.  Patient was prescribed antibiotic and sent home.  Her symptoms started to improve, and then worsened again.  She is become weak over the past couple of days and has not been able to walk very much with a walker.  She is also has some diminished appetite, constipation, and weakness while walking with her walker.  Prior to this bladder infection, she is minimal assistance with walking, however with the recent infection, the patient has needed increasing support to the point where she is barely able to walk around.  She does admit to being mildly short of breath when up and moving around.  She has had some diffuse, nonradiating, cramping abdominal pain is been worse over the past several days.  No palliating or provoking factors.  No vomiting or diarrhea.  She does have a lack of appetite  Emergency Department Course: CT of abdomen shows diverticulosis without infection.  No acute process.  Chest x-ray shows pulmonary effusions with pulmonary congestion.  Troponin negative.  Lactic acid negative.  White count 7.3.  All of the labs relatively normal.  While in the ED, patient came mildly hypotensive when standing up.  Review of Systems:   Pt denies any fevers, chills, nausea, vomiting, diarrhea, constipation, abdominal pain, shortness of breath, dyspnea on exertion, orthopnea, cough, wheezing, palpitations, headache, vision changes, lightheadedness, dizziness, melena, rectal bleeding.  Review of systems are otherwise negative  Past Medical History:   Diagnosis Date  . Cataract   . Colon polyp   . Diverticulosis   . Edema   . Elevated blood sugar   . Essential hypertension, benign   . High cholesterol   . Osteoarthritis   . Osteoporosis   . Other and unspecified hyperlipidemia    Past Surgical History:  Procedure Laterality Date  . APPENDECTOMY    . CATARACT EXTRACTION    . TONSILLECTOMY     Social History:  reports that she has never smoked. She has never used smokeless tobacco. She reports that she does not drink alcohol or use drugs. Patient lives at home  Allergies  Allergen Reactions  . Alendronate Sodium Other (See Comments)    unknown  . Celebrex [Celecoxib] Other (See Comments)    unknown  . Dilacor [Diltiazem Hcl] Other (See Comments)    unknown  . Evista [Raloxifene Hydrochloride] Other (See Comments)    unknown  . Propulsid [Cisapride] Other (See Comments)    unknown    No family history on file.  No family history heart disease  Prior to Admission medications   Medication Sig Start Date End Date Taking? Authorizing Provider  acetaminophen (TYLENOL) 500 MG tablet Take 1,000 mg by mouth daily as needed for moderate pain.    Yes [provider]  aspirin 81 MG EC tablet Take 81 mg by mouth every morning.    Yes [provider]  calcium carbonate (TUMS - DOSED IN MG ELEMENTAL CALCIUM) 500 MG chewable tablet Chew 1 tablet by mouth daily.     Yes [provider]  cephALEXin (KEFLEX) 250 MG capsule Take 1 capsule (250  mg total) by mouth 4 (four) times daily. 10/22/17  Yes Hayden Rasmussen, MD  Cholecalciferol (VITAMIN D3) 2000 UNITS TABS Take 2 tablets by mouth daily after breakfast.    Yes [provider]  Menthol, Topical Analgesic, (BIOFREEZE) 4 % GEL Apply topically BID to effected area. 06/04/17  Yes Chipper Herb, MD  amLODipine (NORVASC) 5 MG tablet TAKE 1 TABLET (5 MG TOTAL) BY MOUTH DAILY. 07/09/17   Chipper Herb, MD  atorvastatin (LIPITOR) 80 MG tablet TAKE 1  TABLET (80 MG TOTAL) BY MOUTH DAILY. 07/26/17   Chipper Herb, MD  busPIRone (BUSPAR) 5 MG tablet TAKE ONE TABLET BY MOUTH TWICE DAILY FOR ANXIETY 09/14/17   Chipper Herb, MD  diclofenac sodium (VOLTAREN) 1 % GEL Apply 2 g topically 4 (four) times daily. 06/05/16   Timmothy Euler, MD  furosemide (LASIX) 20 MG tablet Take 1 tablet (20 mg total) by mouth 2 (two) times daily. TAKE ONE TABLET BY MOUTH ONE  TIME DAILY Patient taking differently: Take 20 mg daily by mouth. TAKE ONE TABLET BY MOUTH ONE  TIME DAILY 10/11/16   Daleen Bo, MD  furosemide (LASIX) 20 MG tablet TAKE ONE TABLET BY MOUTH ONE TIME DAILY 08/21/17   Chipper Herb, MD  LINZESS 145 MCG CAPS capsule TAKE 1 CAPSULE (145 MCG TOTAL) BY MOUTH DAILY. 10/23/16   Chipper Herb, MD  lisinopril (PRINIVIL,ZESTRIL) 20 MG tablet TAKE 1 TABLET (20 MG TOTAL) DAILY BY MOUTH. 10/26/17   Chipper Herb, MD  PAZEO 0.7 % SOLN Place 1 drop into both eyes daily.  07/12/16   [provider]    Physical Exam: BP 130/69   Pulse 84   Temp (!) 97.4 F (36.3 C) (Oral)   Resp 20   Ht 5\' 3"  (1.6 m)   Wt 60.3 kg (133 lb)   SpO2 92%   BMI 23.56 kg/m   . General: Elderly Caucasian female. Awake and alert and oriented x3. No acute cardiopulmonary distress.  Marland Kitchen HEENT: Normocephalic atraumatic.  Right and left ears normal in appearance.  Pupils equal, round, reactive to light. Extraocular muscles are intact. Sclerae anicteric and noninjected.  Moist mucosal membranes. No mucosal lesions.  . Neck: Neck supple without lymphadenopathy. No carotid bruits. No masses palpated.  . Cardiovascular: Regular rate with normal S1-S2 sounds. No murmurs, rubs, gallops auscultated.  Mildly elevated JVD JVD.  Marland Kitchen Respiratory: Mild rales in the bases bilaterally.  No accessory muscle use. . Abdomen: Soft, nontender, nondistended. Active bowel sounds. No masses or hepatosplenomegaly  . Skin: No rashes, lesions, or ulcerations.  Dry, warm to touch. 2+ dorsalis pedis  and radial pulses. . Musculoskeletal: No calf or leg pain. All major joints not erythematous nontender.  No upper or lower joint deformation.  Good ROM.  No contractures  . Psychiatric: Intact judgment and insight. Pleasant and cooperative. . Neurologic: No focal neurological deficits. Strength is 5/5 and symmetric in upper and lower extremities.  Cranial nerves II through XII are grossly intact.           Labs on Admission: I have personally reviewed following labs and imaging studies  CBC: Recent Labs  Lab 10/22/17 1736 10/26/17 1319  WBC 7.3 7.3  NEUTROABS 6.1 5.7  HGB 13.8 14.1  HCT 41.8 42.1  MCV 97.4 96.3  PLT 205 867   Basic Metabolic Panel: Recent Labs  Lab 10/22/17 1736 10/26/17 1319  NA 140 139  K 3.6 3.7  CL 109  104  CO2 24 27  GLUCOSE 123* 112*  BUN 19 14  CREATININE 0.61 0.53  CALCIUM 8.0* 8.7*   GFR: Estimated Creatinine Clearance: 30.9 mL/min (by C-G formula based on SCr of 0.53 mg/dL). Liver Function Tests: Recent Labs  Lab 10/22/17 1736 10/26/17 1319  AST 17 17  ALT 13 11  ALKPHOS 76 90  BILITOT 0.6 0.8  PROT 6.3* 6.4*  ALBUMIN 3.5 3.3*   Recent Labs  Lab 10/22/17 1736 10/26/17 1319  LIPASE 26 23   No results for input(s): AMMONIA in the last 168 hours. Coagulation Profile: No results for input(s): INR, PROTIME in the last 168 hours. Cardiac Enzymes: Recent Labs  Lab 10/22/17 1736 10/26/17 1319  TROPONINI <0.03 <0.03   BNP (last 3 results) No results for input(s): PROBNP in the last 8760 hours. HbA1C: No results for input(s): HGBA1C in the last 72 hours. CBG: No results for input(s): GLUCAP in the last 168 hours. Lipid Profile: No results for input(s): CHOL, HDL, LDLCALC, TRIG, CHOLHDL, LDLDIRECT in the last 72 hours. Thyroid Function Tests: No results for input(s): TSH, T4TOTAL, FREET4, T3FREE, THYROIDAB in the last 72 hours. Anemia Panel: No results for input(s): VITAMINB12, FOLATE, FERRITIN, TIBC, IRON, RETICCTPCT in the  last 72 hours. Urine analysis:    Component Value Date/Time   COLORURINE YELLOW 10/26/2017 1311   APPEARANCEUR CLEAR 10/26/2017 1311   LABSPEC 1.012 10/26/2017 1311   PHURINE 6.0 10/26/2017 1311   GLUCOSEU NEGATIVE 10/26/2017 1311   HGBUR NEGATIVE 10/26/2017 1311   BILIRUBINUR NEGATIVE 10/26/2017 1311   BILIRUBINUR neg 06/04/2014 1011   KETONESUR NEGATIVE 10/26/2017 1311   PROTEINUR NEGATIVE 10/26/2017 1311   UROBILINOGEN 0.2 12/07/2014 1322   NITRITE NEGATIVE 10/26/2017 1311   LEUKOCYTESUR NEGATIVE 10/26/2017 1311   Sepsis Labs: @LABRCNTIP (procalcitonin:4,lacticidven:4) )No results found for this or any previous visit (from the past 240 hour(s)).   Radiological Exams on Admission: Dg Chest 2 View  Result Date: 10/26/2017 CLINICAL DATA:  Abdominal pain nausea vomiting EXAM: CHEST - 2 VIEW COMPARISON:  12/14/2015 FINDINGS: Mild bibasilar airspace disease and small effusions. Mild vascular congestion without edema. Cardiac enlargement with atherosclerotic aortic arch. IMPRESSION: Cardiac enlargement with vascular congestion and small bilateral effusions suggesting fluid overload. Mild bibasilar atelectasis. Electronically Signed   By: Franchot Gallo M.D.   On: 10/26/2017 14:29   Ct Abdomen Pelvis W Contrast  Result Date: 10/26/2017 CLINICAL DATA:  Lower abdominal pain.  Urinary tract infection. EXAM: CT ABDOMEN AND PELVIS WITH CONTRAST TECHNIQUE: Multidetector CT imaging of the abdomen and pelvis was performed using the standard protocol following bolus administration of intravenous contrast. CONTRAST:  52mL OMNIPAQUE IOHEXOL 300 MG/ML  SOLN COMPARISON:  09/20/2013. FINDINGS: Lower chest: Small pericardial effusion with a maximum thickness of 12 mm. Small bilateral pleural effusions. Mild bilateral compressive lower lobe atelectasis. Extensive atheromatous coronary artery calcifications. Mildly enlarged heart. Hepatobiliary: Cholecystectomy clips with stable mild intrahepatic biliary  ductal dilatation, within normal limits for the age of the patient and post cholecystectomy. Stable calcified liver granulomata. Pancreas: Diffuse pancreatic atrophy.  No visible mass. Spleen: Normal in size without focal abnormality. Adrenals/Urinary Tract: Normal appearing adrenal glands. Bilateral renal cysts, including left parapelvic cysts. Unremarkable urinary bladder and ureters. Stomach/Bowel: Large number of colonic diverticula. No evidence of diverticulitis. Surgically absent appendix. Unremarkable stomach and small bowel. Vascular/Lymphatic: Dense atheromatous arterial calcifications, including the coronary arteries. No enlarged lymph nodes. Reproductive: Uterus and bilateral adnexa are unremarkable. Other: Small proximal left inguinal hernia containing fat. Tiny umbilical hernia  containing fat. There is also fluid and soft tissue density within the more anterior portion of the umbilicus. Musculoskeletal: Lumbar and lower thoracic spine degenerative changes. IMPRESSION: 1. No acute abdominal abnormality. 2. Extensive colonic diverticulosis without evidence of diverticulitis. 3. Small pericardial effusion. 4. Small bilateral pleural effusions. 5. Extensive atheromatous arterial calcifications, including the coronary arteries. 6. Diffuse pancreatic atrophy. Electronically Signed   By: Claudie Revering M.D.   On: 10/26/2017 15:11    EKG: Independently reviewed.  Moderate on EKG shows A. fib.  I disagree and believe that the patient has some P waves and is in sinus rhythm.  Low baseline  Assessment/Plan: Principal Problem:   Weakness Active Problems:   Hypertension   Hearing deficit   Chronic constipation   Acute CHF (congestive heart failure) ([place])   Acute lower UTI    This patient was discussed with the ED physician, including pertinent vitals, physical exam findings, labs, and imaging.  We also discussed care given by the ED provider.  1. Weakness a. Will observe overnight, particularly as  patient is at high risk of failing at home and coming back to the hospital b. Does not appear to be related to infection as normal white count and lactic acid. c. Likely secondary to CHF d. We will treat CHF and evaluate for progression and improvement 2. Acute CHF Telemetry monitoring Strict I/O Daily Weights Diuresis: lasix 20mg  IV bid Potassium: 30 mEq twice a day by mouth Echo cardiac exam tomorrow Repeat BMP tomorrow 3. Acute UTI a. Keflex x 3 more days 4. Chronic constipation a. Continue linzess b. Add bisacodyl 5. Hypertension a. Has been as patient became mildly hypotensive, will hold amlodipine tomorrow and decrease lisinopril b. Monitor blood pressure closely, especially with adding Lasix twice a day 6. Hearing  DVT prophylaxis: Lovenox Consultants: none Code Status: DNR Family Communication: sister in room during interview and exam Disposition Plan: Patient should be able to go home following observation   Truett Mainland, DO [facility] Pager [phone]  If 7PM-7AM, please contact night-coverage [email or web address] Password [email or web address]

## 2017-10-27 ENCOUNTER — Observation Stay (HOSPITAL_COMMUNITY): Payer: Medicare Other

## 2017-10-27 DIAGNOSIS — I509 Heart failure, unspecified: Secondary | ICD-10-CM | POA: Diagnosis not present

## 2017-10-27 DIAGNOSIS — R531 Weakness: Secondary | ICD-10-CM | POA: Diagnosis not present

## 2017-10-27 DIAGNOSIS — R0609 Other forms of dyspnea: Secondary | ICD-10-CM | POA: Diagnosis not present

## 2017-10-27 DIAGNOSIS — I10 Essential (primary) hypertension: Secondary | ICD-10-CM | POA: Diagnosis not present

## 2017-10-27 LAB — BASIC METABOLIC PANEL
Anion gap: 9 (ref 5–15)
BUN: 12 mg/dL (ref 8–23)
CALCIUM: 8.5 mg/dL — AB (ref 8.9–10.3)
CHLORIDE: 103 mmol/L (ref 98–111)
CO2: 29 mmol/L (ref 22–32)
CREATININE: 0.64 mg/dL (ref 0.44–1.00)
GFR calc Af Amer: >60 mL/min (ref >60)
Glucose, Bld: 114 mg/dL — ABNORMAL HIGH (ref 70–99)
Potassium: 3.6 mmol/L (ref 3.5–5.1)
SODIUM: 141 mmol/L (ref 135–145)

## 2017-10-27 LAB — ECHOCARDIOGRAM COMPLETE
Height: 63 in
WEIGHTICAEL: 1985.9 [oz_av]

## 2017-10-27 LAB — TSH: TSH: 3.067 u[IU]/mL (ref 0.350–4.500)

## 2017-10-27 MED ORDER — ENSURE ENLIVE PO LIQD
237.0000 mL | Freq: Two times a day (BID) | ORAL | Status: DC
  Administered 2017-10-27 – 2017-10-29 (×3): 237 mL via ORAL

## 2017-10-27 NOTE — Progress Notes (Signed)
*  PRELIMINARY RESULTS* Echocardiogram 2D Echocardiogram has been performed.  Leavy Cella 10/27/2017, 11:22 AM

## 2017-10-27 NOTE — Progress Notes (Signed)
[facility] PROGRESS NOTE  Marissa Burns KJZ:[id number] DOB: [birth year] DOA: 10/26/2017 PCP: Chipper Herb, MD  Brief  Summary   84 y.o. female with a history of essential hypertension, osteoarthritis, osteoporosis, hyperlipidemia.  Patient was recently seen in the ED 4 days ago for UTI.  Patient was prescribed antibiotic and sent home.  Her symptoms started to improve, and then worsened again.  She is become weak over the past couple of days and has not been able to walk very much with a walker.  She is also has some diminished appetite, constipation, and weakness while walking with her walker.  Prior to this bladder infection, she is minimal assistance with walking, however with the recent infection, the patient has needed increasing support to the point where she is barely able to walk around.  She does admit to being mildly short of breath when up and moving around.  She has had some diffuse, nonradiating, cramping abdominal pain is been worse over the past several days.  No palliating or provoking factors.  No vomiting or diarrhea.  She does have a lack of appetite  Emergency Department Course: CT of abdomen shows diverticulosis without infection.  No acute process.  Chest x-ray shows pulmonary effusions with pulmonary congestion.  Troponin negative.  Lactic acid negative.  White count 7.3.  All of the labs relatively normal.  While in the ED, patient came mildly hypotensive when standing up.    Assessment/Plan:  Acute heart failure. Pulmonary edema on admission. CXR: Cardiac enlargement with vascular congestion and small bilateral effusions suggesting fluid overload. Patient is on home lasix as needed -Improving with iv diuresis with lasix, cont lisinopril, will consider BB if BP tolerates. cont monitor I/o,. Daily weights. Pend echo   Recent UTI. To complete treatment regimen with keflex   HTN. Cont current regimen. Monitor   Weakness. Neuro exam is non focal. Obtain  pt/ot   Code Status: DNR Family Communication: d/w patient, her family, RN (indicate person spoken with, relationship, and if by phone, the number) Disposition Plan: home 24-48 hrs    Consultants:  none  Procedures:  Echo   Antibiotics:  none (indicate start date, and stop date if known)  HPI/Subjective: Alert. HOH. No distress   Objective: Vitals:   10/26/17 2018 10/27/17 0630  BP: (!) 141/84 140/81  Pulse: 87 100  Resp: 16 17  Temp: 97.9 F (36.6 C) 98 F (36.7 C)  SpO2: 94% 95%    Intake/Output Summary (Last 24 hours) at 10/27/2017 1148 Last data filed at 10/27/2017 0859 Gross per 24 hour  Intake 60 ml  Output 550 ml  Net -490 ml   Filed Weights   10/26/17 1250 10/26/17 2018 10/27/17 0500  Weight: 60.3 kg (133 lb) 57 kg (125 lb 10.6 oz) 56.3 kg (124 lb 1.9 oz)    Exam:   General:  No distress   Cardiovascular: s1,s2 rrr  Respiratory: few crackles LL  Abdomen: soft, nt   Musculoskeletal: no leg edema    Data Reviewed: Basic Metabolic Panel: Recent Labs  Lab 10/22/17 1736 10/26/17 1319 10/27/17 0726  NA 140 139 141  K 3.6 3.7 3.6  CL 109 104 103  CO2 24 27 29   GLUCOSE 123* 112* 114*  BUN 19 14 12   CREATININE 0.61 0.53 0.64  CALCIUM 8.0* 8.7* 8.5*   Liver Function Tests: Recent Labs  Lab 10/22/17 1736 10/26/17 1319  AST 17 17  ALT 13 11  ALKPHOS 76 90  BILITOT 0.6  0.8  PROT 6.3* 6.4*  ALBUMIN 3.5 3.3*   Recent Labs  Lab 10/22/17 1736 10/26/17 1319  LIPASE 26 23   No results for input(s): AMMONIA in the last 168 hours. CBC: Recent Labs  Lab 10/22/17 1736 10/26/17 1319  WBC 7.3 7.3  NEUTROABS 6.1 5.7  HGB 13.8 14.1  HCT 41.8 42.1  MCV 97.4 96.3  PLT 205 218   Cardiac Enzymes: Recent Labs  Lab 10/22/17 1736 10/26/17 1319  TROPONINI <0.03 <0.03   BNP (last 3 results) Recent Labs    10/26/17 1319  BNP 366.0*    ProBNP (last 3 results) No results for input(s): PROBNP in the last 8760 hours.  CBG: No  results for input(s): GLUCAP in the last 168 hours.  No results found for this or any previous visit (from the past 240 hour(s)).   Studies: Dg Chest 2 View  Result Date: 10/26/2017 CLINICAL DATA:  Abdominal pain nausea vomiting EXAM: CHEST - 2 VIEW COMPARISON:  12/14/2015 FINDINGS: Mild bibasilar airspace disease and small effusions. Mild vascular congestion without edema. Cardiac enlargement with atherosclerotic aortic arch. IMPRESSION: Cardiac enlargement with vascular congestion and small bilateral effusions suggesting fluid overload. Mild bibasilar atelectasis. Electronically Signed   By: Franchot Gallo M.D.   On: 10/26/2017 14:29   Ct Abdomen Pelvis W Contrast  Result Date: 10/26/2017 CLINICAL DATA:  Lower abdominal pain.  Urinary tract infection. EXAM: CT ABDOMEN AND PELVIS WITH CONTRAST TECHNIQUE: Multidetector CT imaging of the abdomen and pelvis was performed using the standard protocol following bolus administration of intravenous contrast. CONTRAST:  70mL OMNIPAQUE IOHEXOL 300 MG/ML  SOLN COMPARISON:  09/20/2013. FINDINGS: Lower chest: Small pericardial effusion with a maximum thickness of 12 mm. Small bilateral pleural effusions. Mild bilateral compressive lower lobe atelectasis. Extensive atheromatous coronary artery calcifications. Mildly enlarged heart. Hepatobiliary: Cholecystectomy clips with stable mild intrahepatic biliary ductal dilatation, within normal limits for the age of the patient and post cholecystectomy. Stable calcified liver granulomata. Pancreas: Diffuse pancreatic atrophy.  No visible mass. Spleen: Normal in size without focal abnormality. Adrenals/Urinary Tract: Normal appearing adrenal glands. Bilateral renal cysts, including left parapelvic cysts. Unremarkable urinary bladder and ureters. Stomach/Bowel: Large number of colonic diverticula. No evidence of diverticulitis. Surgically absent appendix. Unremarkable stomach and small bowel. Vascular/Lymphatic: Dense  atheromatous arterial calcifications, including the coronary arteries. No enlarged lymph nodes. Reproductive: Uterus and bilateral adnexa are unremarkable. Other: Small proximal left inguinal hernia containing fat. Tiny umbilical hernia containing fat. There is also fluid and soft tissue density within the more anterior portion of the umbilicus. Musculoskeletal: Lumbar and lower thoracic spine degenerative changes. IMPRESSION: 1. No acute abdominal abnormality. 2. Extensive colonic diverticulosis without evidence of diverticulitis. 3. Small pericardial effusion. 4. Small bilateral pleural effusions. 5. Extensive atheromatous arterial calcifications, including the coronary arteries. 6. Diffuse pancreatic atrophy. Electronically Signed   By: Claudie Revering M.D.   On: 10/26/2017 15:11    Scheduled Meds: . aspirin EC  81 mg Oral q morning - 10a  . atorvastatin  80 mg Oral q1800  . busPIRone  5 mg Oral BID  . cephALEXin  250 mg Oral QID  . diclofenac sodium  2 g Topical QID  . enoxaparin (LOVENOX) injection  40 mg Subcutaneous Q24H  . furosemide  30 mg Intravenous BID  . linaclotide  145 mcg Oral Daily  . lisinopril  10 mg Oral Daily  . potassium chloride  20 mEq Oral BID  . sodium chloride flush  3 mL Intravenous Q12H  Continuous Infusions: . sodium chloride      Principal Problem:   Weakness Active Problems:   Hypertension   Hearing deficit   Chronic constipation   Acute CHF (congestive heart failure) ([facility])   Acute lower UTI    Time spent: >35 minutes     Kinnie Feil  [facility] Pager [phone]. If 7PM-7AM, please contact night-coverage at [email or web address], password [facility] 10/27/2017, 11:48 AM  LOS: 0 days

## 2017-10-27 NOTE — Progress Notes (Signed)
Initial Nutrition Assessment  DOCUMENTATION CODES:  Not applicable  INTERVENTION:  Ensure Enlive po BID, each supplement provides 350 kcal and 20 grams of protein  Given age and to promote intake, would consider diet liberalization to regular  NUTRITION DIAGNOSIS:  Increased nutrient needs related to chronic illness(CHF) as evidenced by estimated nutritional requirements for this condition  GOAL:  Patient will meet greater than or equal to 90% of their needs  MONITOR:  PO intake, Supplement acceptance, Labs, Weight trends, I & O's  REASON FOR ASSESSMENT:  Malnutrition Screening Tool    ASSESSMENT:  84 y/o female PMHx HTN/HLD, HF. Had been seen in ED 4 days PTA for UTI. Prescribed abx, but uti symptoms persisted. Represented with abd pain, SOB, diminished appetite, constipation and weakness. Work up showed plural effusions and pulmonary congestion.  Admitted for observation  RD operating remotely on weekend. Patient charted on due to MST of 2. Reportedly has had unintentional wt loss.   Per chart, she is 124.1 lbs. Difficult to determine if she has had any recent weight loss as the majority of her charted weights this year have just been carried from past encounters.   She has eaten 50% of both meals documented on. Will start Ensure to supplement intake.   Physical Exam: Unable to conduct  Labs: BGs: 112-125, Albumin:3.3, BNP:>350 Meds: PO abx, Lasix, Linzess, KCL  Recent Labs  Lab 10/22/17 1736 10/26/17 1319 10/27/17 0726  NA 140 139 141  K 3.6 3.7 3.6  CL 109 104 103  CO2 24 27 29   BUN 19 14 12   CREATININE 0.61 0.53 0.64  CALCIUM 8.0* 8.7* 8.5*  GLUCOSE 123* 112* 114*   NUTRITION - FOCUSED PHYSICAL EXAM: Unable to conduct  Diet Order:   Diet Order           Diet Heart Room service appropriate? Yes; Fluid consistency: Thin  Diet effective now         EDUCATION NEEDS:  No education needs have been identified at this time  Skin: Ecchymosis to BLE  Last  BM:  7/19  Height:  Ht Readings from Last 1 Encounters:  10/26/17 5\' 3"  (1.6 m)   Weight:  Wt Readings from Last 1 Encounters:  10/27/17 124 lb 1.9 oz (56.3 kg)   Wt Readings from Last 10 Encounters:  10/27/17 124 lb 1.9 oz (56.3 kg)  10/22/17 133 lb (60.3 kg)  05/31/17 133 lb (60.3 kg)  02/14/17 133 lb (60.3 kg)  01/25/17 129 lb (58.5 kg)  11/12/16 129 lb (58.5 kg)  10/11/16 129 lb (58.5 kg)  09/27/16 129 lb (58.5 kg)  09/22/16 130 lb (59 kg)  09/11/16 128 lb 9.6 oz (58.3 kg)   Ideal Body Weight:  52.27 kg  BMI:  Body mass index is 21.99 kg/m.  Estimated Nutritional Needs:  Kcal:  1450-1650 (26-29 kcal/kg bw) Protein:  68-79g Pro (1.2-1.4 g/kg bw) Fluid:  >1.4 L fluid (25 ml/kg bw)  Burtis Junes RD, LDN, CNSC Clinical Nutrition Available Tues-Sat via Pager: [phone] 10/27/2017 3:43 PM

## 2017-10-28 DIAGNOSIS — N390 Urinary tract infection, site not specified: Secondary | ICD-10-CM | POA: Diagnosis not present

## 2017-10-28 DIAGNOSIS — I509 Heart failure, unspecified: Secondary | ICD-10-CM | POA: Diagnosis not present

## 2017-10-28 DIAGNOSIS — R531 Weakness: Secondary | ICD-10-CM | POA: Diagnosis not present

## 2017-10-28 LAB — BASIC METABOLIC PANEL
Anion gap: 8 (ref 5–15)
BUN: 21 mg/dL (ref 8–23)
CALCIUM: 8.5 mg/dL — AB (ref 8.9–10.3)
CHLORIDE: 103 mmol/L (ref 98–111)
CO2: 30 mmol/L (ref 22–32)
CREATININE: 0.76 mg/dL (ref 0.44–1.00)
GFR calc non Af Amer: >60 mL/min (ref >60)
GLUCOSE: 114 mg/dL — AB (ref 70–99)
Potassium: 4.1 mmol/L (ref 3.5–5.1)
Sodium: 141 mmol/L (ref 135–145)

## 2017-10-28 MED ORDER — FUROSEMIDE 10 MG/ML IJ SOLN
20.0000 mg | Freq: Two times a day (BID) | INTRAMUSCULAR | Status: DC
  Administered 2017-10-28 – 2017-10-29 (×2): 20 mg via INTRAVENOUS
  Filled 2017-10-28 (×2): qty 2

## 2017-10-28 NOTE — Progress Notes (Signed)
[facility] PROGRESS NOTE  Marissa PEYSER POE:[id number] DOB: 07/07/1914 DOA: 10/26/2017 PCP: Chipper Herb, MD  Brief  Summary   84 y.o. female with a history of essential hypertension, osteoarthritis, osteoporosis, hyperlipidemia.  Marissa Burns was recently seen in the ED 4 days ago for UTI.  Marissa Burns was prescribed antibiotic and sent home.  Her symptoms started to improve, and then worsened again.  She is become weak over the past couple of days and has not been able to walk very much with a walker.  She is also has some diminished appetite, constipation, and weakness while walking with her walker.  Prior to this bladder infection, she is minimal assistance with walking, however with the recent infection, the Marissa Burns has needed increasing support to the point where she is barely able to walk around.  She does admit to being mildly short of breath when up and moving around.  She has had some diffuse, nonradiating, cramping abdominal pain is been worse over the past several days.  No palliating or provoking factors.  No vomiting or diarrhea.  She does have a lack of appetite  Emergency Department Course: CT of abdomen shows diverticulosis without infection.  No acute process.  Chest x-ray shows pulmonary effusions with pulmonary congestion.  Troponin negative.  Lactic acid negative.  White count 7.3.  All of the labs relatively normal.  While in the ED, Marissa Burns came mildly hypotensive when standing up.    Assessment/Plan:  Acute heart failure. Suspect diastolic CHF. Pulmonary edema on admission. CXR: Cardiac enlargement with vascular congestion and small bilateral effusions suggesting fluid overload. Marissa Burns is on home lasix as needed -Improving with iv diuresis with lasix, will cont iv diuresis for today, then transition to PO in AM. cont lisinopril, will consider BB if BP tolerates. cont monitor I/o,. Daily weights. Echo: estimated ejection fraction was in the range of 60% to 65%. Wall  motion was normal; there were no  regional wall motion abnormalities.  Recent UTI. To complete treatment regimen with keflex   HTN. Cont current regimen. Monitor   Weakness. Neuro exam is non focal. Obtain pt/ot   Code Status: DNR Family Communication: d/w Marissa Burns, her family, RN (indicate person spoken with, relationship, and if by phone, the number) Disposition Plan: home 24-48 hrs. Obtain PT   Consultants:  none  Procedures:  Echo   Antibiotics:  none (indicate start date, and stop date if known)  HPI/Subjective: Alert. HOH. No distress. Continues to improve with iv diuresis    Objective: Vitals:   10/27/17 2142 10/28/17 0517  BP: 110/68 120/79  Pulse: 77 86  Resp: 18 18  Temp: 98.3 F (36.8 C) 97.7 F (36.5 C)  SpO2: 94% 97%    Intake/Output Summary (Last 24 hours) at 10/28/2017 0842 Last data filed at 10/28/2017 0400 Gross per 24 hour  Intake 220 ml  Output 600 ml  Net -380 ml   Filed Weights   10/26/17 2018 10/27/17 0500 10/28/17 0517  Weight: 57 kg (125 lb 10.6 oz) 56.3 kg (124 lb 1.9 oz) 55.4 kg (122 lb 2.2 oz)    Exam:   General:  No distress   Cardiovascular: s1,s2 rrr  Respiratory: few crackles LL  Abdomen: soft, nt   Musculoskeletal: no leg edema    Data Reviewed: Basic Metabolic Panel: Recent Labs  Lab 10/22/17 1736 10/26/17 1319 10/27/17 0726 10/28/17 0618  NA 140 139 141 141  K 3.6 3.7 3.6 4.1  CL 109 104 103 103  CO2 24  27 29 30   GLUCOSE 123* 112* 114* 114*  BUN 19 14 12 21   CREATININE 0.61 0.53 0.64 0.76  CALCIUM 8.0* 8.7* 8.5* 8.5*   Liver Function Tests: Recent Labs  Lab 10/22/17 1736 10/26/17 1319  AST 17 17  ALT 13 11  ALKPHOS 76 90  BILITOT 0.6 0.8  PROT 6.3* 6.4*  ALBUMIN 3.5 3.3*   Recent Labs  Lab 10/22/17 1736 10/26/17 1319  LIPASE 26 23   No results for input(s): AMMONIA in the last 168 hours. CBC: Recent Labs  Lab 10/22/17 1736 10/26/17 1319  WBC 7.3 7.3  NEUTROABS 6.1 5.7  HGB 13.8  14.1  HCT 41.8 42.1  MCV 97.4 96.3  PLT 205 218   Cardiac Enzymes: Recent Labs  Lab 10/22/17 1736 10/26/17 1319  TROPONINI <0.03 <0.03   BNP (last 3 results) Recent Labs    10/26/17 1319  BNP 366.0*    ProBNP (last 3 results) No results for input(s): PROBNP in the last 8760 hours.  CBG: No results for input(s): GLUCAP in the last 168 hours.  Recent Results (from the past 240 hour(s))  Urine culture     Status: Abnormal (Preliminary result)   Collection Time: 10/26/17  1:11 PM  Result Value Ref Range Status   Specimen Description   Final    URINE, CLEAN CATCH Performed at [facility], [address] [place], [place] [address]    Special Requests   Final    NONE Performed at [facility], [address] [place], [place] [address]    Culture 30,000 COLONIES/mL GRAM NEGATIVE RODS (A)  Final   Report Status PENDING  Incomplete     Studies: Dg Chest 2 View  Result Date: 10/26/2017 CLINICAL DATA:  Abdominal pain nausea vomiting EXAM: CHEST - 2 VIEW COMPARISON:  12/14/2015 FINDINGS: Mild bibasilar airspace disease and small effusions. Mild vascular congestion without edema. Cardiac enlargement with atherosclerotic aortic arch. IMPRESSION: Cardiac enlargement with vascular congestion and small bilateral effusions suggesting fluid overload. Mild bibasilar atelectasis. Electronically Signed   By: Franchot Gallo M.D.   On: 10/26/2017 14:29   Ct Abdomen Pelvis W Contrast  Result Date: 10/26/2017 CLINICAL DATA:  Lower abdominal pain.  Urinary tract infection. EXAM: CT ABDOMEN AND PELVIS WITH CONTRAST TECHNIQUE: Multidetector CT imaging of the abdomen and pelvis was performed using the standard protocol following bolus administration of intravenous contrast. CONTRAST:  59mL OMNIPAQUE IOHEXOL 300 MG/ML  SOLN COMPARISON:  09/20/2013. FINDINGS: Lower chest: Small pericardial effusion with a maximum thickness of 12 mm. Small bilateral pleural effusions. Mild bilateral compressive lower  lobe atelectasis. Extensive atheromatous coronary artery calcifications. Mildly enlarged heart. Hepatobiliary: Cholecystectomy clips with stable mild intrahepatic biliary ductal dilatation, within normal limits for the age of the Marissa Burns and post cholecystectomy. Stable calcified liver granulomata. Pancreas: Diffuse pancreatic atrophy.  No visible mass. Spleen: Normal in size without focal abnormality. Adrenals/Urinary Tract: Normal appearing adrenal glands. Bilateral renal cysts, including left parapelvic cysts. Unremarkable urinary bladder and ureters. Stomach/Bowel: Large number of colonic diverticula. No evidence of diverticulitis. Surgically absent appendix. Unremarkable stomach and small bowel. Vascular/Lymphatic: Dense atheromatous arterial calcifications, including the coronary arteries. No enlarged lymph nodes. Reproductive: Uterus and bilateral adnexa are unremarkable. Other: Small proximal left inguinal hernia containing fat. Tiny umbilical hernia containing fat. There is also fluid and soft tissue density within the more anterior portion of the umbilicus. Musculoskeletal: Lumbar and lower thoracic spine degenerative changes. IMPRESSION: 1. No acute abdominal abnormality. 2. Extensive colonic diverticulosis without evidence  of diverticulitis. 3. Small pericardial effusion. 4. Small bilateral pleural effusions. 5. Extensive atheromatous arterial calcifications, including the coronary arteries. 6. Diffuse pancreatic atrophy. Electronically Signed   By: Claudie Revering M.D.   On: 10/26/2017 15:11    Scheduled Meds: . aspirin EC  81 mg Oral q morning - 10a  . atorvastatin  80 mg Oral q1800  . busPIRone  5 mg Oral BID  . cephALEXin  250 mg Oral QID  . diclofenac sodium  2 g Topical QID  . enoxaparin (LOVENOX) injection  40 mg Subcutaneous Q24H  . feeding supplement (ENSURE ENLIVE)  237 mL Oral BID BM  . furosemide  30 mg Intravenous BID  . linaclotide  145 mcg Oral Daily  . lisinopril  10 mg Oral  Daily  . potassium chloride  20 mEq Oral BID  . sodium chloride flush  3 mL Intravenous Q12H   Continuous Infusions: . sodium chloride      Principal Problem:   Weakness Active Problems:   Hypertension   Hearing deficit   Chronic constipation   Acute CHF (congestive heart failure) ([place])   Acute lower UTI    Time spent: >35 minutes     Kinnie Feil  [facility] Pager [phone]. If 7PM-7AM, please contact night-coverage at [email or web address], password [facility] 10/28/2017, 8:42 AM  LOS: 0 days

## 2017-10-29 DIAGNOSIS — I5031 Acute diastolic (congestive) heart failure: Secondary | ICD-10-CM

## 2017-10-29 DIAGNOSIS — I951 Orthostatic hypotension: Secondary | ICD-10-CM | POA: Diagnosis not present

## 2017-10-29 DIAGNOSIS — I110 Hypertensive heart disease with heart failure: Secondary | ICD-10-CM | POA: Diagnosis present

## 2017-10-29 DIAGNOSIS — R630 Anorexia: Secondary | ICD-10-CM | POA: Diagnosis present

## 2017-10-29 DIAGNOSIS — I5021 Acute systolic (congestive) heart failure: Secondary | ICD-10-CM | POA: Diagnosis present

## 2017-10-29 DIAGNOSIS — H9190 Unspecified hearing loss, unspecified ear: Secondary | ICD-10-CM | POA: Diagnosis present

## 2017-10-29 DIAGNOSIS — N390 Urinary tract infection, site not specified: Secondary | ICD-10-CM | POA: Diagnosis present

## 2017-10-29 DIAGNOSIS — I4891 Unspecified atrial fibrillation: Secondary | ICD-10-CM | POA: Diagnosis present

## 2017-10-29 DIAGNOSIS — Z66 Do not resuscitate: Secondary | ICD-10-CM | POA: Diagnosis present

## 2017-10-29 DIAGNOSIS — Z888 Allergy status to other drugs, medicaments and biological substances status: Secondary | ICD-10-CM | POA: Diagnosis not present

## 2017-10-29 DIAGNOSIS — I10 Essential (primary) hypertension: Secondary | ICD-10-CM | POA: Diagnosis not present

## 2017-10-29 DIAGNOSIS — Z9849 Cataract extraction status, unspecified eye: Secondary | ICD-10-CM | POA: Diagnosis not present

## 2017-10-29 DIAGNOSIS — M1990 Unspecified osteoarthritis, unspecified site: Secondary | ICD-10-CM | POA: Diagnosis present

## 2017-10-29 DIAGNOSIS — Z7982 Long term (current) use of aspirin: Secondary | ICD-10-CM | POA: Diagnosis not present

## 2017-10-29 DIAGNOSIS — M810 Age-related osteoporosis without current pathological fracture: Secondary | ICD-10-CM | POA: Diagnosis present

## 2017-10-29 DIAGNOSIS — K5909 Other constipation: Secondary | ICD-10-CM | POA: Diagnosis present

## 2017-10-29 DIAGNOSIS — Z79899 Other long term (current) drug therapy: Secondary | ICD-10-CM | POA: Diagnosis not present

## 2017-10-29 DIAGNOSIS — E785 Hyperlipidemia, unspecified: Secondary | ICD-10-CM | POA: Diagnosis present

## 2017-10-29 DIAGNOSIS — I959 Hypotension, unspecified: Secondary | ICD-10-CM | POA: Diagnosis present

## 2017-10-29 LAB — BASIC METABOLIC PANEL
Anion gap: 9 (ref 5–15)
BUN: 34 mg/dL — AB (ref 8–23)
CHLORIDE: 102 mmol/L (ref 98–111)
CO2: 31 mmol/L (ref 22–32)
Calcium: 8.5 mg/dL — ABNORMAL LOW (ref 8.9–10.3)
Creatinine, Ser: 0.88 mg/dL (ref 0.44–1.00)
GFR calc Af Amer: >60 mL/min (ref >60)
GFR calc non Af Amer: 52 mL/min — ABNORMAL LOW (ref >60)
Glucose, Bld: 117 mg/dL — ABNORMAL HIGH (ref 70–99)
POTASSIUM: 4.2 mmol/L (ref 3.5–5.1)
SODIUM: 142 mmol/L (ref 135–145)

## 2017-10-29 LAB — URINE CULTURE: Culture: 30000 — AB

## 2017-10-29 MED ORDER — CARVEDILOL 3.125 MG PO TABS: 3.1250 mg | ORAL_TABLET | Freq: Two times a day (BID) | ORAL | 1 refills | Status: DC

## 2017-10-29 MED ORDER — FUROSEMIDE 20 MG PO TABS: 20.0000 mg | ORAL_TABLET | Freq: Every day | ORAL | 0 refills | Status: DC

## 2017-10-29 MED ORDER — APIXABAN 2.5 MG PO TABS: 2.5000 mg | ORAL_TABLET | Freq: Two times a day (BID) | ORAL | 1 refills | Status: DC

## 2017-10-29 MED ORDER — CARVEDILOL 3.125 MG PO TABS: 3.1250 mg | ORAL_TABLET | Freq: Two times a day (BID) | ORAL | Status: DC

## 2017-10-29 MED ORDER — APIXABAN 2.5 MG PO TABS
2.5000 mg | ORAL_TABLET | Freq: Two times a day (BID) | ORAL | Status: DC
  Administered 2017-10-29: 2.5 mg via ORAL
  Filled 2017-10-29: qty 1

## 2017-10-29 MED ORDER — ENOXAPARIN SODIUM 30 MG/0.3ML ~~LOC~~ SOLN: 30.0000 mg | SUBCUTANEOUS | Status: DC

## 2017-10-29 MED ORDER — LISINOPRIL 5 MG PO TABS: 5.0000 mg | ORAL_TABLET | Freq: Every day | ORAL | 1 refills | Status: DC

## 2017-10-29 NOTE — Care Management Note (Addendum)
Case Management Note  Patient Details  Name: Marissa Burns MRN: [id number] Date of Birth: 10/20/1914  Subjective/Objective:     Admitted with weakness, a-fib, ? New onset CHF. Pt is from home, lives alone, has aids that are with her most of the day and see that she gets into bed at night. She has sister that checks on her frequently throughout the day also. She uses a RW with ambulation. Per sister (at bedside) pt has had [facility] in the past but she does not remember the name of the company. Does not have a preference of [place] provider.                Action/Plan: DC home with [place] PT. Referral sent to [facility], sister aware and okay with plan. Aware HH has 48 hrs to make first visit. HHA should call pt's home first and then sisters phone number if not answer. Tim, [facility], given referral. Anticipate DC in next 1-2 days. Pt given 30-day free Eliquis card.   Expected Discharge Date:  10/29/17               Expected Discharge Plan:  [place]  In-House Referral:  NA  Discharge planning Services  CM Consult  Post Acute Care Choice:  Home Health Choice offered to:  Patient  HH Arranged:  PT, RN [place] Agency:  [facility] (formerly [facility]  Status of Service:  Completed, signed off  Sherald Barge, RN 10/29/2017, 11:30 AM

## 2017-10-29 NOTE — Discharge Summary (Signed)
Physician Discharge Summary  Marissa Burns AYT:[id number] DOB: [birth year] DOA: 10/26/2017  PCP: Chipper Herb, MD  Admit date: 10/26/2017 Discharge date: 10/29/2017  Admitted From: Home Disposition:  Home  Recommendations for Outpatient Follow-up:  1. Follow up with PCP in 1-2 weeks 2. Please obtain BMP/CBC in one week   Home Health: YES Equipment/Devices: HHPT  Discharge Condition: Stable CODE STATUS: FULL Diet recommendation: Heart Healthy    Brief/Interim Summary: 84 y.o.femalewith a history of essential hypertension, osteoarthritis, osteoporosis, hyperlipidemia.Patient was recently seen in the ED4 days ago for UTI. Patient was prescribed antibiotic and sent home. Her symptoms started to improve, and then worsened again. She is become weak over the past couple of days and has not been able to walk very much with a walker. She is also has some diminished appetite, constipation, and weakness while walking with her walker. Prior to this bladder infection, she is minimal assistance with walking, however with the recent infection, the patient has needed increasing support to the point where she is barely able to walk around. She does admit to being mildly short of breath when up and moving around.  She has had some diffuse, nonradiating, cramping abdominal pain is been worse over the past several days. No palliating or provoking factors. No vomiting or diarrhea. She does have a lack of appetite which improved during the hospitalization  Emergency Department Course: CT of abdomen shows diverticulosis without infection. No acute process. Chest x-ray shows pulmonary effusions with pulmonary congestion. Troponin negative. Lactic acid negative. White count 7.3. All of the labs relatively normal. While in the ED, patient came mildly hypotensive when standing up.     Discharge Diagnoses:   Acute Diastolic heart failure.. - Pulmonary edema on admission. CXR:  Cardiac enlargement with vascular congestion and small bilateral effusions suggesting fluid overload. Patient is on home lasix as needed -Improving with iv diuresis with lasix, will cont iv diuresis for today, then transition to PO in AM. cont lisinopril,  -home with coreg 3.125 mg bid - Echo: estimated ejection fraction wasin the range of 60% to 65%. Wall motion was normal; there were noregional wall motion abnormalities. -discharge weight 122  Recent UTI. completed treatment regimen with keflex   HTN. Cont current regimen -home with lisinopril 5 mg daily and coreg 3.125 mg bid  New onset Afib -rate controlled -CHADSVASc = 5 -discussed risks, benefits, and alternatives with patient and sister including but not limited to bleeding.  They understand and agree to start apixaban -repeat CBC within one week after d/c -stop ASA 81 mg daily  Weakness. Neuro exam is non focal. Obtain pt/ot-->HHPT    Discharge Instructions   Allergies as of 10/29/2017      Reactions   Alendronate Sodium Other (See Comments)   unknown   Celebrex [celecoxib] Other (See Comments)   unknown   Dilacor [diltiazem Hcl] Other (See Comments)   unknown   Evista [raloxifene Hydrochloride] Other (See Comments)   unknown   Propulsid [cisapride] Other (See Comments)   unknown      Medication List    STOP taking these medications   amLODipine 5 MG tablet Commonly known as:  NORVASC   aspirin 81 MG EC tablet   cephALEXin 250 MG capsule Commonly known as:  KEFLEX     TAKE these medications   acetaminophen 500 MG tablet Commonly known as:  TYLENOL Take 1,000 mg by mouth daily as needed for moderate pain.   apixaban 2.5 MG Tabs tablet Commonly known  asArne Cleveland Take 1 tablet (2.5 mg total) by mouth 2 (two) times daily.   atorvastatin 80 MG tablet Commonly known as:  LIPITOR TAKE 1 TABLET (80 MG TOTAL) BY MOUTH DAILY.   busPIRone 5 MG tablet Commonly known as:  BUSPAR TAKE ONE TABLET BY  MOUTH TWICE DAILY FOR ANXIETY   calcium carbonate 500 MG chewable tablet Commonly known as:  TUMS - dosed in mg elemental calcium Chew 1 tablet by mouth daily.   carvedilol 3.125 MG tablet Commonly known as:  COREG Take 1 tablet (3.125 mg total) by mouth 2 (two) times daily with a meal.   furosemide 20 MG tablet Commonly known as:  LASIX Take 1 tablet (20 mg total) by mouth daily.   lisinopril 5 MG tablet Commonly known as:  PRINIVIL,ZESTRIL Take 1 tablet (5 mg total) by mouth daily. Start taking on:  10/30/2017 What changed:    medication strength  how much to take   Menthol (Topical Analgesic) 4 % Gel Commonly known as:  BIOFREEZE Apply topically BID to effected area.   PAZEO 0.7 % Soln Generic drug:  Olopatadine HCl Place 1 drop into both eyes daily.   Vitamin D3 2000 units Tabs Take 2 tablets by mouth daily after breakfast.       Allergies  Allergen Reactions  . Alendronate Sodium Other (See Comments)    unknown  . Celebrex [Celecoxib] Other (See Comments)    unknown  . Dilacor [Diltiazem Hcl] Other (See Comments)    unknown  . Evista [Raloxifene Hydrochloride] Other (See Comments)    unknown  . Propulsid [Cisapride] Other (See Comments)    unknown    Consultations:  none   Procedures/Studies: Dg Chest 2 View  Result Date: 10/26/2017 CLINICAL DATA:  Abdominal pain nausea vomiting EXAM: CHEST - 2 VIEW COMPARISON:  12/14/2015 FINDINGS: Mild bibasilar airspace disease and small effusions. Mild vascular congestion without edema. Cardiac enlargement with atherosclerotic aortic arch. IMPRESSION: Cardiac enlargement with vascular congestion and small bilateral effusions suggesting fluid overload. Mild bibasilar atelectasis. Electronically Signed   By: Franchot Gallo M.D.   On: 10/26/2017 14:29   Ct Abdomen Pelvis W Contrast  Result Date: 10/26/2017 CLINICAL DATA:  Lower abdominal pain.  Urinary tract infection. EXAM: CT ABDOMEN AND PELVIS WITH CONTRAST  TECHNIQUE: Multidetector CT imaging of the abdomen and pelvis was performed using the standard protocol following bolus administration of intravenous contrast. CONTRAST:  82mL OMNIPAQUE IOHEXOL 300 MG/ML  SOLN COMPARISON:  09/20/2013. FINDINGS: Lower chest: Small pericardial effusion with a maximum thickness of 12 mm. Small bilateral pleural effusions. Mild bilateral compressive lower lobe atelectasis. Extensive atheromatous coronary artery calcifications. Mildly enlarged heart. Hepatobiliary: Cholecystectomy clips with stable mild intrahepatic biliary ductal dilatation, within normal limits for the age of the patient and post cholecystectomy. Stable calcified liver granulomata. Pancreas: Diffuse pancreatic atrophy.  No visible mass. Spleen: Normal in size without focal abnormality. Adrenals/Urinary Tract: Normal appearing adrenal glands. Bilateral renal cysts, including left parapelvic cysts. Unremarkable urinary bladder and ureters. Stomach/Bowel: Large number of colonic diverticula. No evidence of diverticulitis. Surgically absent appendix. Unremarkable stomach and small bowel. Vascular/Lymphatic: Dense atheromatous arterial calcifications, including the coronary arteries. No enlarged lymph nodes. Reproductive: Uterus and bilateral adnexa are unremarkable. Other: Small proximal left inguinal hernia containing fat. Tiny umbilical hernia containing fat. There is also fluid and soft tissue density within the more anterior portion of the umbilicus. Musculoskeletal: Lumbar and lower thoracic spine degenerative changes. IMPRESSION: 1. No acute abdominal abnormality. 2. Extensive colonic  diverticulosis without evidence of diverticulitis. 3. Small pericardial effusion. 4. Small bilateral pleural effusions. 5. Extensive atheromatous arterial calcifications, including the coronary arteries. 6. Diffuse pancreatic atrophy. Electronically Signed   By: Claudie Revering M.D.   On: 10/26/2017 15:11        Discharge  Exam: Vitals:   10/29/17 0008 10/29/17 0527  BP: (!) 114/59 132/74  Pulse: 74 72  Resp: 16 18  Temp: 98.2 F (36.8 C) 97.6 F (36.4 C)  SpO2: 96% 97%   Vitals:   10/28/17 0517 10/28/17 1527 10/29/17 0008 10/29/17 0527  BP: 120/79 (!) 100/51 (!) 114/59 132/74  Pulse: 86 83 74 72  Resp: 18 16 16 18   Temp: 97.7 F (36.5 C)  98.2 F (36.8 C) 97.6 F (36.4 C)  TempSrc: Oral  Oral Oral  SpO2: 97% 95% 96% 97%  Weight: 55.4 kg (122 lb 2.2 oz)   55.6 kg (122 lb 9.2 oz)  Height:        General: Pt is alert, awake, not in acute distress Cardiovascular: IRRR, S1/S2 +, no rubs, no gallops Respiratory: CTA bilaterally, no wheezing, no rhonchi Abdominal: Soft, NT, ND, bowel sounds + Extremities: trace LE edema, no cyanosis   The results of significant diagnostics from this hospitalization (including imaging, microbiology, ancillary and laboratory) are listed below for reference.    Significant Diagnostic Studies: Dg Chest 2 View  Result Date: 10/26/2017 CLINICAL DATA:  Abdominal pain nausea vomiting EXAM: CHEST - 2 VIEW COMPARISON:  12/14/2015 FINDINGS: Mild bibasilar airspace disease and small effusions. Mild vascular congestion without edema. Cardiac enlargement with atherosclerotic aortic arch. IMPRESSION: Cardiac enlargement with vascular congestion and small bilateral effusions suggesting fluid overload. Mild bibasilar atelectasis. Electronically Signed   By: Franchot Gallo M.D.   On: 10/26/2017 14:29   Ct Abdomen Pelvis W Contrast  Result Date: 10/26/2017 CLINICAL DATA:  Lower abdominal pain.  Urinary tract infection. EXAM: CT ABDOMEN AND PELVIS WITH CONTRAST TECHNIQUE: Multidetector CT imaging of the abdomen and pelvis was performed using the standard protocol following bolus administration of intravenous contrast. CONTRAST:  64mL OMNIPAQUE IOHEXOL 300 MG/ML  SOLN COMPARISON:  09/20/2013. FINDINGS: Lower chest: Small pericardial effusion with a maximum thickness of 12 mm. Small  bilateral pleural effusions. Mild bilateral compressive lower lobe atelectasis. Extensive atheromatous coronary artery calcifications. Mildly enlarged heart. Hepatobiliary: Cholecystectomy clips with stable mild intrahepatic biliary ductal dilatation, within normal limits for the age of the patient and post cholecystectomy. Stable calcified liver granulomata. Pancreas: Diffuse pancreatic atrophy.  No visible mass. Spleen: Normal in size without focal abnormality. Adrenals/Urinary Tract: Normal appearing adrenal glands. Bilateral renal cysts, including left parapelvic cysts. Unremarkable urinary bladder and ureters. Stomach/Bowel: Large number of colonic diverticula. No evidence of diverticulitis. Surgically absent appendix. Unremarkable stomach and small bowel. Vascular/Lymphatic: Dense atheromatous arterial calcifications, including the coronary arteries. No enlarged lymph nodes. Reproductive: Uterus and bilateral adnexa are unremarkable. Other: Small proximal left inguinal hernia containing fat. Tiny umbilical hernia containing fat. There is also fluid and soft tissue density within the more anterior portion of the umbilicus. Musculoskeletal: Lumbar and lower thoracic spine degenerative changes. IMPRESSION: 1. No acute abdominal abnormality. 2. Extensive colonic diverticulosis without evidence of diverticulitis. 3. Small pericardial effusion. 4. Small bilateral pleural effusions. 5. Extensive atheromatous arterial calcifications, including the coronary arteries. 6. Diffuse pancreatic atrophy. Electronically Signed   By: Claudie Revering M.D.   On: 10/26/2017 15:11     Microbiology: Recent Results (from the past 240 hour(s))  Urine culture  Status: Abnormal   Collection Time: 10/26/17  1:11 PM  Result Value Ref Range Status   Specimen Description   Final    URINE, CLEAN CATCH Performed at [facility], [address], [place], [place] [address]    Special Requests   Final    NONE Performed at [facility], [address] [place], [place] [address]    Culture 30,000 COLONIES/mL ESCHERICHIA COLI (A)  Final   Report Status 10/29/2017 FINAL  Final   Organism ID, Bacteria ESCHERICHIA COLI (A)  Final      Susceptibility   Escherichia coli - MIC*    AMPICILLIN 4 SENSITIVE Sensitive     CEFAZOLIN <=4 SENSITIVE Sensitive     CEFTRIAXONE <=1 SENSITIVE Sensitive     CIPROFLOXACIN <=0.25 SENSITIVE Sensitive     GENTAMICIN <=1 SENSITIVE Sensitive     IMIPENEM <=0.25 SENSITIVE Sensitive     NITROFURANTOIN <=16 SENSITIVE Sensitive     TRIMETH/SULFA <=20 SENSITIVE Sensitive     AMPICILLIN/SULBACTAM <=2 SENSITIVE Sensitive     PIP/TAZO <=4 SENSITIVE Sensitive     Extended ESBL NEGATIVE Sensitive     * 30,000 COLONIES/mL ESCHERICHIA COLI     Labs: Basic Metabolic Panel: Recent Labs  Lab 10/22/17 1736 10/26/17 1319 10/27/17 0726 10/28/17 0618 10/29/17 0558  NA 140 139 141 141 142  K 3.6 3.7 3.6 4.1 4.2  CL 109 104 103 103 102  CO2 24 27 29 30 31   GLUCOSE 123* 112* 114* 114* 117*  BUN 19 14 12 21  34*  CREATININE 0.61 0.53 0.64 0.76 0.88  CALCIUM 8.0* 8.7* 8.5* 8.5* 8.5*   Liver Function Tests: Recent Labs  Lab 10/22/17 1736 10/26/17 1319  AST 17 17  ALT 13 11  ALKPHOS 76 90  BILITOT 0.6 0.8  PROT 6.3* 6.4*  ALBUMIN 3.5 3.3*   Recent Labs  Lab 10/22/17 1736 10/26/17 1319  LIPASE 26 23   No results for input(s): AMMONIA in the last 168 hours. CBC: Recent Labs  Lab 10/22/17 1736 10/26/17 1319  WBC 7.3 7.3  NEUTROABS 6.1 5.7  HGB 13.8 14.1  HCT 41.8 42.1  MCV 97.4 96.3  PLT 205 218   Cardiac Enzymes: Recent Labs  Lab 10/22/17 1736 10/26/17 1319  TROPONINI <0.03 <0.03   BNP: Invalid input(s): POCBNP CBG: No results for input(s): GLUCAP in the last 168 hours.  Time coordinating discharge:  36 minutes  Signed:  Orson Eva, DO [facility] Pager: [phone] 10/29/2017, 12:46 PM

## 2017-10-29 NOTE — Progress Notes (Signed)
Patient and family state understanding of discharge instructions 

## 2017-10-29 NOTE — Evaluation (Signed)
Physical Therapy Evaluation Patient Details Name: Marissa Burns MRN: [id number] DOB: [birth year] Today's Date: 10/29/2017   History of Present Illness  Marissa Burns is a 84 y.o. female with a history of essential hypertension, osteoarthritis, osteoporosis, hyperlipidemia.  Patient was recently seen in the ED 4 days ago for UTI.  Patient was prescribed antibiotic and sent home.  Her symptoms started to improve, and then worsened again.  She is become weak over the past couple of days and has not been able to walk very much with a walker.  She is also has some diminished appetite, constipation, and weakness while walking with her walker.  Prior to this bladder infection, she is minimal assistance with walking, however with the recent infection, the patient has needed increasing support to the point where she is barely able to walk around.  She does admit to being mildly short of breath when up and moving around.    Clinical Impression  Patient functioning near baseline for functional mobility and gait, limited for taking steps in room due to fatigue and tolerated sitting up in chair after therapy with her sister present in room.  Plan: patient to be discharged home today Plan:  Patient discharged from physical therapy to care of nursing with recommendations below.    Follow Up Recommendations Home health PT;Supervision/Assistance - 24 hour    Equipment Recommendations  None recommended by PT    Recommendations for Other Services       Precautions / Restrictions Precautions Precautions: Fall Restrictions Weight Bearing Restrictions: No      Mobility  Bed Mobility Overal bed mobility: Needs Assistance Bed Mobility: Supine to Sit     Supine to sit: Min assist     General bed mobility comments: need help to scoot to bedside  Transfers Overall transfer level: Needs assistance Equipment used: Rolling walker (2 wheeled) Transfers: Stand Pivot Transfers;Squat Pivot Transfers    Stand pivot transfers: Min assist Squat pivot transfers: Min assist     General transfer comment: slow labored movement  Ambulation/Gait Ambulation/Gait assistance: Min assist Gait Distance (Feet): 15 Feet Assistive device: Rolling walker (2 wheeled) Gait Pattern/deviations: Decreased step length - right;Decreased step length - left;Decreased stride length Gait velocity: slow   General Gait Details: demonstrates slow labored cadence for taking steps in room, mostly limited due to fatigue   Stairs            Wheelchair Mobility    Modified Rankin (Stroke Patients Only)       Balance Overall balance assessment: Needs assistance Sitting-balance support: Feet supported;No upper extremity supported Sitting balance-Leahy Scale: Good     Standing balance support: Bilateral upper extremity supported;During functional activity Standing balance-Leahy Scale: Fair                               Pertinent Vitals/Pain Pain Assessment: No/denies pain    Home Living Family/patient expects to be discharged to:: Private residence Living Arrangements: Alone Available Help at Discharge: Family;Personal care attendant;Available PRN/intermittently Type of Home: House Home Access: Stairs to enter Entrance Stairs-Rails: None Entrance Stairs-Number of Steps: 2 Home Layout: One level Home Equipment: Clinical cytogeneticist - 2 wheels      Prior Function Level of Independence: Needs assistance   Gait / Transfers Assistance Needed: assisted for going up/down steps, Supervised household gait with RW  ADL's / Homemaking Assistance Needed: has home aides 2 hours in AM x  7 days/week, from  6-6:30 PM until patient put to bed in PM x  7 days/week        Hand Dominance        Extremity/Trunk Assessment   Upper Extremity Assessment Upper Extremity Assessment: Generalized weakness    Lower Extremity Assessment Lower Extremity Assessment: Generalized weakness     Cervical / Trunk Assessment Cervical / Trunk Assessment: Kyphotic  Communication   Communication: No difficulties  Cognition Arousal/Alertness: Awake/alert Behavior During Therapy: WFL for tasks assessed/performed Overall Cognitive Status: Within Functional Limits for tasks assessed                                        General Comments      Exercises     Assessment/Plan    PT Assessment All further PT needs can be met in the next venue of care  PT Problem List Decreased strength;Decreased activity tolerance;Decreased balance;Decreased mobility       PT Treatment Interventions      PT Goals (Current goals can be found in the Care Plan section)  Acute Rehab PT Goals Patient Stated Goal: return home with family to assist PT Goal Formulation: With patient/family Time For Goal Achievement: 10-30-17 Potential to Achieve Goals: Good    Frequency     Barriers to discharge        Co-evaluation               AM-PAC PT "6 Clicks" Daily Activity  Outcome Measure Difficulty turning over in bed (including adjusting bedclothes, sheets and blankets)?: None Difficulty moving from lying on back to sitting on the side of the bed? : A Little Difficulty sitting down on and standing up from a chair with arms (e.g., wheelchair, bedside commode, etc,.)?: A Little Help needed moving to and from a bed to chair (including a wheelchair)?: A Little Help needed walking in hospital room?: A Lot Help needed climbing 3-5 steps with a railing? : A Lot 6 Click Score: 17    End of Session   Activity Tolerance: Patient tolerated treatment well;Patient limited by fatigue Patient left: in chair;with chair alarm set Nurse Communication: Mobility status PT Visit Diagnosis: Unsteadiness on feet (R26.81);Other abnormalities of gait and mobility (R26.89);Muscle weakness (generalized) (M62.81)    Time: 9485-4627 PT Time Calculation (min) (ACUTE ONLY): 29 min   Charges:    PT Evaluation $PT Eval Moderate Complexity: 1 Mod PT Treatments $Therapeutic Activity: 23-37 mins   PT G Codes:        2:57 PM, 10/30/17 Lonell Grandchild, MPT Physical Therapist with [facility] [phone] office [phone] mobile phone

## 2017-10-30 ENCOUNTER — Telehealth: Payer: Self-pay | Admitting: *Deleted

## 2017-10-30 ENCOUNTER — Telehealth: Payer: Self-pay | Admitting: Family Medicine

## 2017-10-30 NOTE — Telephone Encounter (Signed)
appt scheduled with Dr Evette Doffing Pt notified

## 2017-10-30 NOTE — Telephone Encounter (Signed)
Call Completed by [name], CMA and Appointment Scheduled: Yes, Date: 10/31/2017 with Dr Emiliano Dyer INFORMATION Date of Discharge:10/29/2017  Discharge Facility: [facility]  Principal Discharge Diagnosis: Weakness, orthostatic hypotension  Patient and/or caregiver is knowledgeable of his/her condition(s) and treatment: Marissa Burns (sister)  MEDICATION RECONCILIATION Current medication list reviewed with patient:Yes  Outpatient Encounter Medications as of 10/30/2017  Medication Sig  . acetaminophen (TYLENOL) 500 MG tablet Take 1,000 mg by mouth daily as needed for moderate pain.   Marland Kitchen apixaban (ELIQUIS) 2.5 MG TABS tablet Take 1 tablet (2.5 mg total) by mouth 2 (two) times daily.  Marland Kitchen atorvastatin (LIPITOR) 80 MG tablet TAKE 1 TABLET (80 MG TOTAL) BY MOUTH DAILY.  . busPIRone (BUSPAR) 5 MG tablet TAKE ONE TABLET BY MOUTH TWICE DAILY FOR ANXIETY  . calcium carbonate (TUMS - DOSED IN MG ELEMENTAL CALCIUM) 500 MG chewable tablet Chew 1 tablet by mouth daily.    . carvedilol (COREG) 3.125 MG tablet Take 1 tablet (3.125 mg total) by mouth 2 (two) times daily with a meal.  . Cholecalciferol (VITAMIN D3) 2000 UNITS TABS Take 2 tablets by mouth daily after breakfast.   . furosemide (LASIX) 20 MG tablet Take 1 tablet (20 mg total) by mouth daily.  Marland Kitchen lisinopril (PRINIVIL,ZESTRIL) 5 MG tablet Take 1 tablet (5 mg total) by mouth daily.  . Menthol, Topical Analgesic, (BIOFREEZE) 4 % GEL Apply topically BID to effected area.  Marland Kitchen PAZEO 0.7 % SOLN Place 1 drop into both eyes daily.    No facility-administered encounter medications on file as of 10/30/2017.     Discharge Medications reviewed and reconciled with current medications.yes  Patient is able to obtain needed medications:Yes  ACTIVITIES OF DAILY LIVING  Is the patient able to perform his/her own ADLs: No.  Patient is receiving home health services: No.  PATIENT EDUCATION Questions/Concerns Discussed: Unable to schedule with  PCP, Dr Laurance Flatten, because of his schedule template. Scheduled with Dr Evette Doffing whom she has seen in the past.

## 2017-10-31 ENCOUNTER — Ambulatory Visit (INDEPENDENT_AMBULATORY_CARE_PROVIDER_SITE_OTHER): Payer: Medicare Other | Admitting: Pediatrics

## 2017-10-31 ENCOUNTER — Encounter: Payer: Self-pay | Admitting: Pediatrics

## 2017-10-31 VITALS — BP 113/61 | HR 85 | Temp 97.0°F | Ht 63.0 in | Wt 122.8 lb

## 2017-10-31 DIAGNOSIS — Z09 Encounter for follow-up examination after completed treatment for conditions other than malignant neoplasm: Secondary | ICD-10-CM | POA: Diagnosis not present

## 2017-10-31 DIAGNOSIS — I10 Essential (primary) hypertension: Secondary | ICD-10-CM

## 2017-10-31 DIAGNOSIS — I5031 Acute diastolic (congestive) heart failure: Secondary | ICD-10-CM

## 2017-10-31 DIAGNOSIS — I4891 Unspecified atrial fibrillation: Secondary | ICD-10-CM

## 2017-10-31 DIAGNOSIS — N390 Urinary tract infection, site not specified: Secondary | ICD-10-CM | POA: Diagnosis not present

## 2017-10-31 NOTE — Progress Notes (Signed)
Subjective:   Patient ID: Marissa Burns, female    DOB: 1914-06-05, 84 y.o.   MRN: [id number] CC: Hospitalization Follow-up  HPI: Marissa Burns is a 84 y.o. female   Today's visit is for Transitional Care Management.  Here today with neighbor and sister.  The patient was discharged on 10/29/2017 with a primary diagnosis of acute diastolic heart failure, recent UTI, hypertension, new onset atrial fibrillation, weakness.  Contact with the patient and/or caregiver, by a clinical staff member, was made on 7/23 and was documented as a telephone encounter within the EMR.  Acute diastolic heart failure: Pulmonary edema present on admission.  Gave IV diuresis in the hospital.  Transition to oral in the morning.  Was continued on home medicines including lisinopril and Coreg.   Recent UTI: Was finished on Keflex.  New onset atrial fibrillation: On beta-blocker.  Chads vas score of 5.  Was started on apixaban.  Aspirin was discontinued.  Weakness: Patient lives alone.  She has an aide come by 2 hours in the morning to help her get dressed and prepare meals for the day.  Her 45 year old sister lives in the area and checks on her regularly.  She has a neighbor who also checks on her.  No other family in the area to help.  She gets around with a walker at home.  She has a potty chair next to her bed.  She wants to stay at home.  Neighbor asks if she would qualify for hospice, says the patient told her yesterday she would not want to go back to the hospital again at the same thing were to happen.  When asked today, patient says she would want to go back to the hospital.  She is very hard of hearing.  She had a bad experience with physical therapy coming out to the house in the past, she did not know what they were therefore she did not want to participate.   Relevant past medical, surgical, family and social history reviewed. Allergies and medications reviewed and updated. Social History   Tobacco  Use  Smoking Status Never Smoker  Smokeless Tobacco Never Used   ROS: Per HPI   Objective:    BP 113/61   Pulse 85   Temp (!) 97 F (36.1 C) (Oral)   Ht _0  (1.6 m)   Wt 122 lb 12.8 oz (55.7 kg)   BMI 21.75 kg/m   Wt Readings from Last 3 Encounters:  10/31/17 122 lb 12.8 oz (55.7 kg)  10/29/17 122 lb 9.2 oz (55.6 kg)  10/22/17 133 lb (60.3 kg)    Gen: NAD, alert, cooperative with exam, NCAT, hard of hearing EYES: EOMI, no conjunctival injection, or no icterus ENT:   OP without erythema LYMPH: no cervical LAD CV: NRRR, normal S1/S2, no murmur, distal pulses 2+ b/l Resp: CTABL, no wheezes, normal WOB Abd: +BS, soft, NTND. no guarding or organomegaly Ext: No edema, warm Neuro: Alert and oriented, strength equal b/l UE and LE, coordination grossly normal MSK: normal muscle bulk  Assessment & Plan:  Marissa Burns was seen today for hospitalization follow-up.  Diagnoses and all orders for this visit:  Essential hypertension Stable, continue current medicines  Atrial fibrillation, unspecified type [name]) Given recent diagnosis recent hospitalization, will have home health nursing come out to do a medicine reconciliation with home meds.   -     Ambulatory referral to [place]  encounter (required for Medicare/Medicaid patients)  Hospital discharge follow-up  Urinary tract infection without hematuria, site unspecified No symptoms.  New start apixaban and daily Lasix.  Will repeat blood work as below. -     CMP14+EGFR -     CBC with Differential/Platelet  Acute diastolic heart failure ([place]) Euvolemic today on exam. She is to now take Lasix daily rather than as needed.  Repeating blood work today.  Follow up plan: Return in about 1 month (around 11/28/2017). Assunta Found, MD [place]

## 2017-11-01 DIAGNOSIS — I4891 Unspecified atrial fibrillation: Secondary | ICD-10-CM | POA: Diagnosis not present

## 2017-11-01 DIAGNOSIS — H9190 Unspecified hearing loss, unspecified ear: Secondary | ICD-10-CM | POA: Diagnosis not present

## 2017-11-01 DIAGNOSIS — I110 Hypertensive heart disease with heart failure: Secondary | ICD-10-CM | POA: Diagnosis not present

## 2017-11-01 DIAGNOSIS — I5041 Acute combined systolic (congestive) and diastolic (congestive) heart failure: Secondary | ICD-10-CM | POA: Diagnosis not present

## 2017-11-01 DIAGNOSIS — M810 Age-related osteoporosis without current pathological fracture: Secondary | ICD-10-CM | POA: Diagnosis not present

## 2017-11-01 DIAGNOSIS — M170 Bilateral primary osteoarthritis of knee: Secondary | ICD-10-CM | POA: Diagnosis not present

## 2017-11-01 LAB — CMP14+EGFR
A/G RATIO: 1.4 (ref 1.2–2.2)
ALK PHOS: 98 IU/L (ref 39–117)
ALT: 16 IU/L (ref 0–32)
AST: 19 IU/L (ref 0–40)
Albumin: 3.5 g/dL (ref 3.2–4.6)
BILIRUBIN TOTAL: 0.3 mg/dL (ref 0.0–1.2)
BUN/Creatinine Ratio: 49 — ABNORMAL HIGH (ref 12–28)
BUN: 36 mg/dL (ref 10–36)
CHLORIDE: 105 mmol/L (ref 96–106)
CO2: 23 mmol/L (ref 20–29)
Calcium: 8.6 mg/dL — ABNORMAL LOW (ref 8.7–10.3)
Creatinine, Ser: 0.74 mg/dL (ref 0.57–1.00)
GFR calc Af Amer: 77 mL/min/{1.73_m2} (ref >59)
GFR, EST NON AFRICAN AMERICAN: 67 mL/min/{1.73_m2} (ref >59)
GLOBULIN, TOTAL: 2.5 g/dL (ref 1.5–4.5)
Glucose: 143 mg/dL — ABNORMAL HIGH (ref 65–99)
POTASSIUM: 4.8 mmol/L (ref 3.5–5.2)
SODIUM: 145 mmol/L — AB (ref 134–144)
Total Protein: 6 g/dL (ref 6.0–8.5)

## 2017-11-01 LAB — CBC WITH DIFFERENTIAL/PLATELET
BASOS ABS: 0 10*3/uL (ref 0.0–0.2)
BASOS: 0 %
EOS (ABSOLUTE): 0.1 10*3/uL (ref 0.0–0.4)
Eos: 2 %
Hematocrit: 41.7 % (ref 34.0–46.6)
Hemoglobin: 14 g/dL (ref 11.1–15.9)
IMMATURE GRANS (ABS): 0 10*3/uL (ref 0.0–0.1)
Immature Granulocytes: 0 %
Lymphocytes Absolute: 1.5 10*3/uL (ref 0.7–3.1)
Lymphs: 23 %
MCH: 32.2 pg (ref 26.6–33.0)
MCHC: 33.6 g/dL (ref 31.5–35.7)
MCV: 96 fL (ref 79–97)
MONOS ABS: 0.5 10*3/uL (ref 0.1–0.9)
Monocytes: 8 %
NEUTROS PCT: 67 %
Neutrophils Absolute: 4.3 10*3/uL (ref 1.4–7.0)
PLATELETS: 335 10*3/uL (ref 150–450)
RBC: 4.35 x10E6/uL (ref 3.77–5.28)
RDW: 13.6 % (ref 12.3–15.4)
WBC: 6.4 10*3/uL (ref 3.4–10.8)

## 2017-11-02 ENCOUNTER — Ambulatory Visit: Payer: Medicare Other | Admitting: Family Medicine

## 2017-11-02 DIAGNOSIS — H9190 Unspecified hearing loss, unspecified ear: Secondary | ICD-10-CM | POA: Diagnosis not present

## 2017-11-02 DIAGNOSIS — I110 Hypertensive heart disease with heart failure: Secondary | ICD-10-CM | POA: Diagnosis not present

## 2017-11-02 DIAGNOSIS — M170 Bilateral primary osteoarthritis of knee: Secondary | ICD-10-CM | POA: Diagnosis not present

## 2017-11-02 DIAGNOSIS — I4891 Unspecified atrial fibrillation: Secondary | ICD-10-CM | POA: Diagnosis not present

## 2017-11-02 DIAGNOSIS — M810 Age-related osteoporosis without current pathological fracture: Secondary | ICD-10-CM | POA: Diagnosis not present

## 2017-11-02 DIAGNOSIS — I5041 Acute combined systolic (congestive) and diastolic (congestive) heart failure: Secondary | ICD-10-CM | POA: Diagnosis not present

## 2017-11-06 DIAGNOSIS — H9190 Unspecified hearing loss, unspecified ear: Secondary | ICD-10-CM | POA: Diagnosis not present

## 2017-11-06 DIAGNOSIS — I110 Hypertensive heart disease with heart failure: Secondary | ICD-10-CM | POA: Diagnosis not present

## 2017-11-06 DIAGNOSIS — M810 Age-related osteoporosis without current pathological fracture: Secondary | ICD-10-CM | POA: Diagnosis not present

## 2017-11-06 DIAGNOSIS — I5041 Acute combined systolic (congestive) and diastolic (congestive) heart failure: Secondary | ICD-10-CM | POA: Diagnosis not present

## 2017-11-06 DIAGNOSIS — I4891 Unspecified atrial fibrillation: Secondary | ICD-10-CM | POA: Diagnosis not present

## 2017-11-06 DIAGNOSIS — M170 Bilateral primary osteoarthritis of knee: Secondary | ICD-10-CM | POA: Diagnosis not present

## 2017-11-08 DIAGNOSIS — I110 Hypertensive heart disease with heart failure: Secondary | ICD-10-CM | POA: Diagnosis not present

## 2017-11-08 DIAGNOSIS — M170 Bilateral primary osteoarthritis of knee: Secondary | ICD-10-CM | POA: Diagnosis not present

## 2017-11-08 DIAGNOSIS — H9190 Unspecified hearing loss, unspecified ear: Secondary | ICD-10-CM | POA: Diagnosis not present

## 2017-11-08 DIAGNOSIS — M810 Age-related osteoporosis without current pathological fracture: Secondary | ICD-10-CM | POA: Diagnosis not present

## 2017-11-08 DIAGNOSIS — I5041 Acute combined systolic (congestive) and diastolic (congestive) heart failure: Secondary | ICD-10-CM | POA: Diagnosis not present

## 2017-11-08 DIAGNOSIS — I4891 Unspecified atrial fibrillation: Secondary | ICD-10-CM | POA: Diagnosis not present

## 2017-11-09 DIAGNOSIS — M810 Age-related osteoporosis without current pathological fracture: Secondary | ICD-10-CM | POA: Diagnosis not present

## 2017-11-09 DIAGNOSIS — I4891 Unspecified atrial fibrillation: Secondary | ICD-10-CM | POA: Diagnosis not present

## 2017-11-09 DIAGNOSIS — M170 Bilateral primary osteoarthritis of knee: Secondary | ICD-10-CM | POA: Diagnosis not present

## 2017-11-09 DIAGNOSIS — I110 Hypertensive heart disease with heart failure: Secondary | ICD-10-CM | POA: Diagnosis not present

## 2017-11-09 DIAGNOSIS — H9190 Unspecified hearing loss, unspecified ear: Secondary | ICD-10-CM | POA: Diagnosis not present

## 2017-11-09 DIAGNOSIS — I5041 Acute combined systolic (congestive) and diastolic (congestive) heart failure: Secondary | ICD-10-CM | POA: Diagnosis not present

## 2017-11-12 ENCOUNTER — Emergency Department (HOSPITAL_COMMUNITY): Payer: Medicare Other

## 2017-11-12 ENCOUNTER — Emergency Department (HOSPITAL_COMMUNITY)
Admission: EM | Admit: 2017-11-12 | Discharge: 2017-11-12 | Disposition: A | Discharge status: Home / Self Care | Payer: Medicare Other | Attending: Emergency Medicine | Admitting: Emergency Medicine

## 2017-11-12 ENCOUNTER — Encounter (HOSPITAL_COMMUNITY): Payer: Self-pay | Admitting: Emergency Medicine

## 2017-11-12 DIAGNOSIS — S8011XA Contusion of right lower leg, initial encounter: Secondary | ICD-10-CM

## 2017-11-12 DIAGNOSIS — I10 Essential (primary) hypertension: Secondary | ICD-10-CM | POA: Insufficient documentation

## 2017-11-12 DIAGNOSIS — Y9301 Activity, walking, marching and hiking: Secondary | ICD-10-CM | POA: Insufficient documentation

## 2017-11-12 DIAGNOSIS — S0083XA Contusion of other part of head, initial encounter: Secondary | ICD-10-CM | POA: Diagnosis not present

## 2017-11-12 DIAGNOSIS — I110 Hypertensive heart disease with heart failure: Secondary | ICD-10-CM | POA: Diagnosis not present

## 2017-11-12 DIAGNOSIS — Z049 Encounter for examination and observation for unspecified reason: Secondary | ICD-10-CM | POA: Diagnosis not present

## 2017-11-12 DIAGNOSIS — M170 Bilateral primary osteoarthritis of knee: Secondary | ICD-10-CM | POA: Diagnosis not present

## 2017-11-12 DIAGNOSIS — I5041 Acute combined systolic (congestive) and diastolic (congestive) heart failure: Secondary | ICD-10-CM | POA: Diagnosis not present

## 2017-11-12 DIAGNOSIS — Z7901 Long term (current) use of anticoagulants: Secondary | ICD-10-CM | POA: Insufficient documentation

## 2017-11-12 DIAGNOSIS — Y999 Unspecified external cause status: Secondary | ICD-10-CM | POA: Insufficient documentation

## 2017-11-12 DIAGNOSIS — W19XXXA Unspecified fall, initial encounter: Secondary | ICD-10-CM

## 2017-11-12 DIAGNOSIS — W1830XA Fall on same level, unspecified, initial encounter: Secondary | ICD-10-CM | POA: Diagnosis not present

## 2017-11-12 DIAGNOSIS — H9190 Unspecified hearing loss, unspecified ear: Secondary | ICD-10-CM | POA: Diagnosis not present

## 2017-11-12 DIAGNOSIS — S0993XA Unspecified injury of face, initial encounter: Secondary | ICD-10-CM | POA: Diagnosis not present

## 2017-11-12 DIAGNOSIS — W01198A Fall on same level from slipping, tripping and stumbling with subsequent striking against other object, initial encounter: Secondary | ICD-10-CM | POA: Insufficient documentation

## 2017-11-12 DIAGNOSIS — M810 Age-related osteoporosis without current pathological fracture: Secondary | ICD-10-CM | POA: Diagnosis not present

## 2017-11-12 DIAGNOSIS — Y92008 Other place in unspecified non-institutional (private) residence as the place of occurrence of the external cause: Secondary | ICD-10-CM | POA: Insufficient documentation

## 2017-11-12 DIAGNOSIS — S0990XA Unspecified injury of head, initial encounter: Secondary | ICD-10-CM | POA: Diagnosis not present

## 2017-11-12 DIAGNOSIS — S199XXA Unspecified injury of neck, initial encounter: Secondary | ICD-10-CM | POA: Diagnosis not present

## 2017-11-12 DIAGNOSIS — Z79899 Other long term (current) drug therapy: Secondary | ICD-10-CM | POA: Diagnosis not present

## 2017-11-12 DIAGNOSIS — I4891 Unspecified atrial fibrillation: Secondary | ICD-10-CM | POA: Diagnosis not present

## 2017-11-12 DIAGNOSIS — S8991XA Unspecified injury of right lower leg, initial encounter: Secondary | ICD-10-CM | POA: Diagnosis not present

## 2017-11-12 DIAGNOSIS — M79661 Pain in right lower leg: Secondary | ICD-10-CM | POA: Diagnosis not present

## 2017-11-12 LAB — I-STAT CHEM 8, ED
BUN: 15 mg/dL (ref 8–23)
Calcium, Ion: 1.11 mmol/L — ABNORMAL LOW (ref 1.15–1.40)
Chloride: 104 mmol/L (ref 98–111)
Creatinine, Ser: 0.5 mg/dL (ref 0.44–1.00)
Glucose, Bld: 100 mg/dL — ABNORMAL HIGH (ref 70–99)
HEMATOCRIT: 36 % (ref 36.0–46.0)
Hemoglobin: 12.2 g/dL (ref 12.0–15.0)
Potassium: 3.3 mmol/L — ABNORMAL LOW (ref 3.5–5.1)
SODIUM: 142 mmol/L (ref 135–145)
TCO2: 24 mmol/L (ref 22–32)

## 2017-11-12 NOTE — ED Triage Notes (Signed)
Pt tripped going into her kitchen walking with her walker.  Fell on face.  Also c/o right shin pain with bruising noted.  Pt lives alone but is alert and oriented but HOH.

## 2017-11-12 NOTE — ED Provider Notes (Signed)
[facility] EMERGENCY DEPARTMENT Provider Note   CSN: [id number] Arrival date & time: 11/12/17  1008     History   Chief Complaint Chief Complaint  Patient presents with  . Fall    HPI Marissa Burns is a 84 y.o. female.  Patient with history of cataracts, high blood pressure, arthritis, uses a walker at baseline and lives alone presents after mechanical fall.  Patient states she was walking from the kitchen and her walker leaning forward causing her to fall and hit her face.  Patient is on anticoagulant.  No syncope, no neurologic symptoms.  Patient denies pain at this time.  Patient has injuries to her nose and right leg.     Past Medical History:  Diagnosis Date  . Cataract   . Colon polyp   . Diverticulosis   . Edema   . Elevated blood sugar   . Essential hypertension, benign   . High cholesterol   . Osteoarthritis   . Osteoporosis   . Other and unspecified hyperlipidemia     Patient Active Problem List   Diagnosis Date Noted  . Acute systolic CHF (congestive heart failure) ([place]) 10/29/2017  . Acute diastolic CHF (congestive heart failure) ([place]) 10/29/2017  . Urinary tract infection without hematuria   . Weakness 10/26/2017  . Acute CHF (congestive heart failure) ([place]) 10/26/2017  . Acute lower UTI 10/26/2017  . BPPV (benign paroxysmal positional vertigo) 12/10/2014  . Visual impairment 01/22/2014  . Chronic constipation 10/08/2013  . Primary osteoarthritis of both knees 10/08/2013  . Hearing deficit 05/20/2013  . Hypertension 10/30/2012  . Hyperlipemia 10/30/2012  . Vitamin D deficiency 10/30/2012  . Arthritis 10/30/2012  . Colon polyp   . Diverticulosis   . Osteoarthritis   . Edema   . Osteoporosis   . Elevated blood sugar   . Cataract     Past Surgical History:  Procedure Laterality Date  . APPENDECTOMY    . CATARACT EXTRACTION    . TONSILLECTOMY       OB History   None      Home Medications    Prior to Admission medications     Medication Sig Start Date End Date Taking? Authorizing Provider  acetaminophen (TYLENOL) 500 MG tablet Take 1,000 mg by mouth daily as needed for moderate pain.     [provider]  apixaban (ELIQUIS) 2.5 MG TABS tablet Take 1 tablet (2.5 mg total) by mouth 2 (two) times daily. 10/29/17   Orson Eva, MD  atorvastatin (LIPITOR) 80 MG tablet TAKE 1 TABLET (80 MG TOTAL) BY MOUTH DAILY. 07/26/17   Chipper Herb, MD  busPIRone (BUSPAR) 5 MG tablet TAKE ONE TABLET BY MOUTH TWICE DAILY FOR ANXIETY 09/14/17   Chipper Herb, MD  calcium carbonate (TUMS - DOSED IN MG ELEMENTAL CALCIUM) 500 MG chewable tablet Chew 1 tablet by mouth daily.      [provider]  carvedilol (COREG) 3.125 MG tablet Take 1 tablet (3.125 mg total) by mouth 2 (two) times daily with a meal. 10/29/17   Tat, Shanon Brow, MD  Cholecalciferol (VITAMIN D3) 2000 UNITS TABS Take 2 tablets by mouth daily after breakfast.     [provider]  furosemide (LASIX) 20 MG tablet Take 1 tablet (20 mg total) by mouth daily. 10/29/17   Orson Eva, MD  lisinopril (PRINIVIL,ZESTRIL) 5 MG tablet Take 1 tablet (5 mg total) by mouth daily. 10/30/17   Orson Eva, MD  Menthol, Topical Analgesic, (BIOFREEZE) 4 % GEL Apply  topically BID to effected area. 06/04/17   Chipper Herb, MD  PAZEO 0.7 % SOLN Place 1 drop into both eyes daily.  07/12/16   [provider]    Family History History reviewed. No pertinent family history.  Social History Social History   Tobacco Use  . Smoking status: Never Smoker  . Smokeless tobacco: Never Used  Substance Use Topics  . Alcohol use: No  . Drug use: No     Allergies   Alendronate sodium; Celebrex [celecoxib]; Dilacor [diltiazem hcl]; Evista [raloxifene hydrochloride]; and Propulsid [cisapride]   Review of Systems Review of Systems  Constitutional: Negative for chills and fever.  HENT: Negative for congestion.   Eyes: Negative for visual disturbance.  Respiratory: Negative  for shortness of breath.   Cardiovascular: Negative for chest pain.  Gastrointestinal: Negative for abdominal pain and vomiting.  Genitourinary: Negative for dysuria and flank pain.  Musculoskeletal: Negative for back pain, neck pain and neck stiffness.  Skin: Positive for wound. Negative for rash.  Neurological: Negative for syncope, light-headedness and headaches.     Physical Exam Updated Vital Signs BP 129/62   Pulse 76   Ht 5\' 3"  (1.6 m)   Wt 55.3 kg (122 lb)   SpO2 93%   BMI 21.61 kg/m   Physical Exam  Constitutional: She is oriented to person, place, and time. She appears well-developed and well-nourished.  HENT:  Head: Normocephalic.  Patient has mild swelling and ecchymosis anterior nasal bridge and inferior orbital region bilateral.  No step-off nontender.  Mild bleeding controlled with pressure.  Neck supple no midline cervical tenderness.  Patient has no hip tenderness with range of motion bilateral, no elbow shoulder or wrist tenderness.  Patient has mild tenderness to right proximal tibia with ecchymosis.  Eyes: Conjunctivae are normal. Right eye exhibits no discharge. Left eye exhibits no discharge.  Neck: Normal range of motion. Neck supple. No tracheal deviation present.  Cardiovascular: Normal rate and regular rhythm.  Pulmonary/Chest: Effort normal and breath sounds normal.  Abdominal: Soft. She exhibits no distension. There is no tenderness. There is no guarding.  Musculoskeletal: She exhibits edema.  Neurological: She is alert and oriented to person, place, and time. No cranial nerve deficit (difficulty hearing) or sensory deficit. Coordination normal.  Skin: Skin is warm. No rash noted.  Psychiatric: She has a normal mood and affect.  Nursing note and vitals reviewed.    ED Treatments / Results  Labs (all labs ordered are listed, but only abnormal results are displayed) Labs Reviewed  I-STAT CHEM 8, ED - Abnormal; Notable for the following components:       Result Value   Potassium 3.3 (*)    Glucose, Bld 100 (*)    Calcium, Ion 1.11 (*)    All other components within normal limits    EKG None  Radiology Dg Tibia/fibula Right  Result Date: 11/12/2017 CLINICAL DATA:  Pain following fall EXAM: RIGHT TIBIA AND FIBULA - 2 VIEW COMPARISON:  None. FINDINGS: Frontal and lateral views were obtained. No acute fracture or dislocation. There is generalized osteoarthritic change in the knee joint. There are multiple foci arterial vascular calcifications/atherosclerosis. IMPRESSION: No fracture or dislocation. Extensive arthropathy in the knee joint. Multifocal atherosclerotic calcification. Electronically Signed   By: Lowella Grip III M.D.   On: 11/12/2017 11:57   Ct Head Wo Contrast  Result Date: 11/12/2017 CLINICAL DATA:  Patient tripped in the kitchen.  Fell on her face. EXAM: CT HEAD WITHOUT CONTRAST CT MAXILLOFACIAL  WITHOUT CONTRAST CT CERVICAL SPINE WITHOUT CONTRAST TECHNIQUE: Multidetector CT imaging of the head, cervical spine, and maxillofacial structures were performed using the standard protocol without intravenous contrast. Multiplanar CT image reconstructions of the cervical spine and maxillofacial structures were also generated. COMPARISON:  None. FINDINGS: CT HEAD FINDINGS Brain: No evidence of acute infarction, hemorrhage, extra-axial collection, ventriculomegaly, or mass effect. Generalized cerebral atrophy. Periventricular white matter low attenuation likely secondary to microangiopathy. Vascular: Cerebrovascular atherosclerotic calcifications are noted. Skull: Negative for fracture or focal lesion. Sinuses/Orbits: Visualized portions of the orbits are unremarkable. Visualized portions of the paranasal sinuses and mastoid air cells are unremarkable. Other: None. CT MAXILLOFACIAL FINDINGS Osseous: No fracture or mandibular dislocation. No destructive process. Orbits: Negative. No traumatic or inflammatory finding. Sinuses: Clear. Soft  tissues: Negative. CT CERVICAL SPINE FINDINGS Alignment: Normal. Skull base and vertebrae: No acute fracture. No primary bone lesion or focal pathologic process. Soft tissues and spinal canal: No prevertebral fluid or swelling. No visible canal hematoma. Disc levels: Degenerative disc disease with disc height loss at C5-6. Osseous fusion across the C6-7 disc space. Severe left facet arthropathy and left foraminal stenosis at C3-4. Moderate left facet arthropathy and mild right facet arthropathy at C4-5. Broad-based disc osteophyte complex at C5-6 with bilateral uncovertebral degenerative changes and foraminal stenosis. Osseous ridging along the posterior aspect of the C6-7 disc space and bilateral foraminal narrowing.6 Upper chest: Lung apices are clear. Other: No fluid collection or hematoma. Bilateral carotid artery atherosclerosis. IMPRESSION: 1. No acute intracranial pathology. 2. No acute osseous injury the maxillofacial bones. 3.  No acute osseous injury of the cervical spine. Electronically Signed   By: Kathreen Devoid   On: 11/12/2017 12:02   Ct Cervical Spine Wo Contrast  Result Date: 11/12/2017 CLINICAL DATA:  Patient tripped in the kitchen.  Fell on her face. EXAM: CT HEAD WITHOUT CONTRAST CT MAXILLOFACIAL WITHOUT CONTRAST CT CERVICAL SPINE WITHOUT CONTRAST TECHNIQUE: Multidetector CT imaging of the head, cervical spine, and maxillofacial structures were performed using the standard protocol without intravenous contrast. Multiplanar CT image reconstructions of the cervical spine and maxillofacial structures were also generated. COMPARISON:  None. FINDINGS: CT HEAD FINDINGS Brain: No evidence of acute infarction, hemorrhage, extra-axial collection, ventriculomegaly, or mass effect. Generalized cerebral atrophy. Periventricular white matter low attenuation likely secondary to microangiopathy. Vascular: Cerebrovascular atherosclerotic calcifications are noted. Skull: Negative for fracture or focal lesion.  Sinuses/Orbits: Visualized portions of the orbits are unremarkable. Visualized portions of the paranasal sinuses and mastoid air cells are unremarkable. Other: None. CT MAXILLOFACIAL FINDINGS Osseous: No fracture or mandibular dislocation. No destructive process. Orbits: Negative. No traumatic or inflammatory finding. Sinuses: Clear. Soft tissues: Negative. CT CERVICAL SPINE FINDINGS Alignment: Normal. Skull base and vertebrae: No acute fracture. No primary bone lesion or focal pathologic process. Soft tissues and spinal canal: No prevertebral fluid or swelling. No visible canal hematoma. Disc levels: Degenerative disc disease with disc height loss at C5-6. Osseous fusion across the C6-7 disc space. Severe left facet arthropathy and left foraminal stenosis at C3-4. Moderate left facet arthropathy and mild right facet arthropathy at C4-5. Broad-based disc osteophyte complex at C5-6 with bilateral uncovertebral degenerative changes and foraminal stenosis. Osseous ridging along the posterior aspect of the C6-7 disc space and bilateral foraminal narrowing.6 Upper chest: Lung apices are clear. Other: No fluid collection or hematoma. Bilateral carotid artery atherosclerosis. IMPRESSION: 1. No acute intracranial pathology. 2. No acute osseous injury the maxillofacial bones. 3.  No acute osseous injury of the cervical spine. Electronically Signed  By: Kathreen Devoid   On: 11/12/2017 12:02   Ct Maxillofacial Wo Contrast  Result Date: 11/12/2017 CLINICAL DATA:  Patient tripped in the kitchen.  Fell on her face. EXAM: CT HEAD WITHOUT CONTRAST CT MAXILLOFACIAL WITHOUT CONTRAST CT CERVICAL SPINE WITHOUT CONTRAST TECHNIQUE: Multidetector CT imaging of the head, cervical spine, and maxillofacial structures were performed using the standard protocol without intravenous contrast. Multiplanar CT image reconstructions of the cervical spine and maxillofacial structures were also generated. COMPARISON:  None. FINDINGS: CT HEAD  FINDINGS Brain: No evidence of acute infarction, hemorrhage, extra-axial collection, ventriculomegaly, or mass effect. Generalized cerebral atrophy. Periventricular white matter low attenuation likely secondary to microangiopathy. Vascular: Cerebrovascular atherosclerotic calcifications are noted. Skull: Negative for fracture or focal lesion. Sinuses/Orbits: Visualized portions of the orbits are unremarkable. Visualized portions of the paranasal sinuses and mastoid air cells are unremarkable. Other: None. CT MAXILLOFACIAL FINDINGS Osseous: No fracture or mandibular dislocation. No destructive process. Orbits: Negative. No traumatic or inflammatory finding. Sinuses: Clear. Soft tissues: Negative. CT CERVICAL SPINE FINDINGS Alignment: Normal. Skull base and vertebrae: No acute fracture. No primary bone lesion or focal pathologic process. Soft tissues and spinal canal: No prevertebral fluid or swelling. No visible canal hematoma. Disc levels: Degenerative disc disease with disc height loss at C5-6. Osseous fusion across the C6-7 disc space. Severe left facet arthropathy and left foraminal stenosis at C3-4. Moderate left facet arthropathy and mild right facet arthropathy at C4-5. Broad-based disc osteophyte complex at C5-6 with bilateral uncovertebral degenerative changes and foraminal stenosis. Osseous ridging along the posterior aspect of the C6-7 disc space and bilateral foraminal narrowing.6 Upper chest: Lung apices are clear. Other: No fluid collection or hematoma. Bilateral carotid artery atherosclerosis. IMPRESSION: 1. No acute intracranial pathology. 2. No acute osseous injury the maxillofacial bones. 3.  No acute osseous injury of the cervical spine. Electronically Signed   By: Kathreen Devoid   On: 11/12/2017 12:02    Procedures Procedures (including critical care time)  Medications Ordered in ED Medications - No data to display   Initial Impression / Assessment and Plan / ED Course  I have reviewed  the triage vital signs and the nursing notes.  Pertinent labs & imaging results that were available during my care of the patient were reviewed by me and considered in my medical decision making (see chart for details).    Patient presents after mechanical fall.  CT scans x-rays and blood work unremarkable.  Patient having no pain on reassessment.,  Family in the room for discharge and follow-up discussion.  Results and differential diagnosis were discussed with the patient/parent/guardian. Xrays were independently reviewed by myself.  Close follow up outpatient was discussed, comfortable with the plan.   Medications - No data to display  Vitals:   11/12/17 1018 11/12/17 1100 11/12/17 1200  BP:  138/75 129/62  Pulse:   76  SpO2:   93%  Weight: 55.3 kg (122 lb)    Height: 5\' 3"  (1.6 m)      Final diagnoses:  Fall, initial encounter  Facial contusion, initial encounter  Contusion of right lower extremity, initial encounter     Final Clinical Impressions(s) / ED Diagnoses   Final diagnoses:  Fall, initial encounter  Facial contusion, initial encounter  Contusion of right lower extremity, initial encounter    ED Discharge Orders    None       Elnora Morrison, MD 11/12/17 1328

## 2017-11-12 NOTE — Discharge Instructions (Addendum)
Tylenol and ice for pain.  If you were given medicines take as directed.  If you are on coumadin or contraceptives realize their levels and effectiveness is altered by many different medicines.  If you have any reaction (rash, tongues swelling, other) to the medicines stop taking and see a physician.    If your blood pressure was elevated in the ER make sure you follow up for management with a primary doctor or return for chest pain, shortness of breath or stroke symptoms.  Please follow up as directed and return to the ER or see a physician for new or worsening symptoms.  Thank you. Vitals:   11/12/17 1018 11/12/17 1100 11/12/17 1200  BP:  138/75 129/62  Pulse:   76  SpO2:   93%  Weight: 55.3 kg (122 lb)    Height: 5\' 3"  (1.6 m)

## 2017-11-12 NOTE — ED Notes (Signed)
PT placed on purewick ?

## 2017-11-13 DIAGNOSIS — H9190 Unspecified hearing loss, unspecified ear: Secondary | ICD-10-CM | POA: Diagnosis not present

## 2017-11-13 DIAGNOSIS — I4891 Unspecified atrial fibrillation: Secondary | ICD-10-CM | POA: Diagnosis not present

## 2017-11-13 DIAGNOSIS — I5041 Acute combined systolic (congestive) and diastolic (congestive) heart failure: Secondary | ICD-10-CM | POA: Diagnosis not present

## 2017-11-13 DIAGNOSIS — I110 Hypertensive heart disease with heart failure: Secondary | ICD-10-CM | POA: Diagnosis not present

## 2017-11-13 DIAGNOSIS — M170 Bilateral primary osteoarthritis of knee: Secondary | ICD-10-CM | POA: Diagnosis not present

## 2017-11-13 DIAGNOSIS — M810 Age-related osteoporosis without current pathological fracture: Secondary | ICD-10-CM | POA: Diagnosis not present

## 2017-11-14 ENCOUNTER — Ambulatory Visit (INDEPENDENT_AMBULATORY_CARE_PROVIDER_SITE_OTHER): Payer: Medicare Other

## 2017-11-14 DIAGNOSIS — Z87440 Personal history of urinary (tract) infections: Secondary | ICD-10-CM

## 2017-11-14 DIAGNOSIS — H547 Unspecified visual loss: Secondary | ICD-10-CM | POA: Diagnosis not present

## 2017-11-14 DIAGNOSIS — Z9181 History of falling: Secondary | ICD-10-CM | POA: Diagnosis not present

## 2017-11-14 DIAGNOSIS — M810 Age-related osteoporosis without current pathological fracture: Secondary | ICD-10-CM | POA: Diagnosis not present

## 2017-11-14 DIAGNOSIS — I110 Hypertensive heart disease with heart failure: Secondary | ICD-10-CM | POA: Diagnosis not present

## 2017-11-14 DIAGNOSIS — H9190 Unspecified hearing loss, unspecified ear: Secondary | ICD-10-CM | POA: Diagnosis not present

## 2017-11-14 DIAGNOSIS — M170 Bilateral primary osteoarthritis of knee: Secondary | ICD-10-CM

## 2017-11-14 DIAGNOSIS — I5041 Acute combined systolic (congestive) and diastolic (congestive) heart failure: Secondary | ICD-10-CM | POA: Diagnosis not present

## 2017-11-14 DIAGNOSIS — Z7901 Long term (current) use of anticoagulants: Secondary | ICD-10-CM

## 2017-11-14 DIAGNOSIS — I4891 Unspecified atrial fibrillation: Secondary | ICD-10-CM | POA: Diagnosis not present

## 2017-11-15 DIAGNOSIS — M810 Age-related osteoporosis without current pathological fracture: Secondary | ICD-10-CM | POA: Diagnosis not present

## 2017-11-15 DIAGNOSIS — I5041 Acute combined systolic (congestive) and diastolic (congestive) heart failure: Secondary | ICD-10-CM | POA: Diagnosis not present

## 2017-11-15 DIAGNOSIS — M170 Bilateral primary osteoarthritis of knee: Secondary | ICD-10-CM | POA: Diagnosis not present

## 2017-11-15 DIAGNOSIS — I110 Hypertensive heart disease with heart failure: Secondary | ICD-10-CM | POA: Diagnosis not present

## 2017-11-15 DIAGNOSIS — I4891 Unspecified atrial fibrillation: Secondary | ICD-10-CM | POA: Diagnosis not present

## 2017-11-15 DIAGNOSIS — H9190 Unspecified hearing loss, unspecified ear: Secondary | ICD-10-CM | POA: Diagnosis not present

## 2017-11-16 DIAGNOSIS — M810 Age-related osteoporosis without current pathological fracture: Secondary | ICD-10-CM | POA: Diagnosis not present

## 2017-11-16 DIAGNOSIS — I110 Hypertensive heart disease with heart failure: Secondary | ICD-10-CM | POA: Diagnosis not present

## 2017-11-16 DIAGNOSIS — M170 Bilateral primary osteoarthritis of knee: Secondary | ICD-10-CM | POA: Diagnosis not present

## 2017-11-16 DIAGNOSIS — I4891 Unspecified atrial fibrillation: Secondary | ICD-10-CM | POA: Diagnosis not present

## 2017-11-16 DIAGNOSIS — I5041 Acute combined systolic (congestive) and diastolic (congestive) heart failure: Secondary | ICD-10-CM | POA: Diagnosis not present

## 2017-11-16 DIAGNOSIS — H9190 Unspecified hearing loss, unspecified ear: Secondary | ICD-10-CM | POA: Diagnosis not present

## 2017-11-19 DIAGNOSIS — I5041 Acute combined systolic (congestive) and diastolic (congestive) heart failure: Secondary | ICD-10-CM | POA: Diagnosis not present

## 2017-11-19 DIAGNOSIS — M170 Bilateral primary osteoarthritis of knee: Secondary | ICD-10-CM | POA: Diagnosis not present

## 2017-11-19 DIAGNOSIS — M810 Age-related osteoporosis without current pathological fracture: Secondary | ICD-10-CM | POA: Diagnosis not present

## 2017-11-19 DIAGNOSIS — H9190 Unspecified hearing loss, unspecified ear: Secondary | ICD-10-CM | POA: Diagnosis not present

## 2017-11-19 DIAGNOSIS — I110 Hypertensive heart disease with heart failure: Secondary | ICD-10-CM | POA: Diagnosis not present

## 2017-11-19 DIAGNOSIS — I4891 Unspecified atrial fibrillation: Secondary | ICD-10-CM | POA: Diagnosis not present

## 2017-11-20 ENCOUNTER — Encounter: Payer: Self-pay | Admitting: Family Medicine

## 2017-11-20 ENCOUNTER — Ambulatory Visit (INDEPENDENT_AMBULATORY_CARE_PROVIDER_SITE_OTHER): Payer: Medicare Other | Admitting: Family Medicine

## 2017-11-20 VITALS — BP 128/77 | HR 85 | Temp 97.0°F | Ht 63.0 in | Wt 122.0 lb

## 2017-11-20 DIAGNOSIS — K5901 Slow transit constipation: Secondary | ICD-10-CM

## 2017-11-20 DIAGNOSIS — Z09 Encounter for follow-up examination after completed treatment for conditions other than malignant neoplasm: Secondary | ICD-10-CM

## 2017-11-20 DIAGNOSIS — I10 Essential (primary) hypertension: Secondary | ICD-10-CM | POA: Diagnosis not present

## 2017-11-20 NOTE — Progress Notes (Signed)
Subjective:    Patient ID: Marissa Burns, female    DOB: 09/01/1914, 84 y.o.   MRN: [id number]  HPI Patient here today for hospital follow up from [place] on 11/12/17 where she was seen for a fall.  This is again a follow-up from a fall and a visit to the emergency room with a contusion of the face and right leg.  The patient is back at home unfortunately we did have a conversation with her when she was in the office the last time about letting her sister work out arrangements for her to be and some type of assisted living care facility instead of being at home by herself.  The patient's memory is short-term.  This was a mechanical fall falling forward and hitting her face.  Most of the injuries were to the patient's nose and right leg.  CT of the head maxillofacial area and neck showed no acute injuries.  An x-ray of the tibia fibula on the right was also negative for any fracture.  The patient is elderly and continues to be hearing impaired.  She is still staying at home by herself.  She says that one grandson wants her to go someplace and the other one does not and she agrees that she needs to be somewhere but she still stays at home.  She complains today of constipation.  She denies any chest pain or soreness and says her face and consequences from the fall seem to be improving.  She denies any trouble with her stomach other than constipation and is passing her water without problems.  She has someone come by the house at nighttime to help her with getting her in bed.  Her sister who is also elderly is having more trouble being the caregiver.  The patient says her knee, and her right leg are feeling better and this was also contused.    Patient Active Problem List   Diagnosis Date Noted  . Acute systolic CHF (congestive heart failure) ([place]) 10/29/2017  . Acute diastolic CHF (congestive heart failure) ([place]) 10/29/2017  . Urinary tract infection without hematuria   . Weakness 10/26/2017  .  Acute CHF (congestive heart failure) ([place]) 10/26/2017  . Acute lower UTI 10/26/2017  . BPPV (benign paroxysmal positional vertigo) 12/10/2014  . Visual impairment 01/22/2014  . Chronic constipation 10/08/2013  . Primary osteoarthritis of both knees 10/08/2013  . Hearing deficit 05/20/2013  . Hypertension 10/30/2012  . Hyperlipemia 10/30/2012  . Vitamin D deficiency 10/30/2012  . Arthritis 10/30/2012  . Colon polyp   . Diverticulosis   . Osteoarthritis   . Edema   . Osteoporosis   . Elevated blood sugar   . Cataract    Outpatient Encounter Medications as of 11/20/2017  Medication Sig  . acetaminophen (TYLENOL) 500 MG tablet Take 1,000 mg by mouth daily as needed for moderate pain.   Marland Kitchen apixaban (ELIQUIS) 2.5 MG TABS tablet Take 1 tablet (2.5 mg total) by mouth 2 (two) times daily.  Marland Kitchen atorvastatin (LIPITOR) 80 MG tablet TAKE 1 TABLET (80 MG TOTAL) BY MOUTH DAILY.  . busPIRone (BUSPAR) 5 MG tablet TAKE ONE TABLET BY MOUTH TWICE DAILY FOR ANXIETY  . calcium carbonate (TUMS - DOSED IN MG ELEMENTAL CALCIUM) 500 MG chewable tablet Chew 1 tablet by mouth daily.    . carvedilol (COREG) 3.125 MG tablet Take 1 tablet (3.125 mg total) by mouth 2 (two) times daily with a meal.  . Cholecalciferol (VITAMIN D3) 2000 UNITS  TABS Take 2 tablets by mouth daily after breakfast.   . furosemide (LASIX) 20 MG tablet Take 1 tablet (20 mg total) by mouth daily.  Marland Kitchen lisinopril (PRINIVIL,ZESTRIL) 5 MG tablet Take 1 tablet (5 mg total) by mouth daily.  . Menthol, Topical Analgesic, (BIOFREEZE) 4 % GEL Apply topically BID to effected area.  Marland Kitchen PAZEO 0.7 % SOLN Place 1 drop into both eyes daily.    No facility-administered encounter medications on file as of 11/20/2017.      Review of Systems  Constitutional: Negative.   HENT: Negative.   Eyes: Negative.   Respiratory: Negative.   Cardiovascular: Negative.   Gastrointestinal: Negative.   Endocrine: Negative.   Genitourinary: Negative.   Musculoskeletal:  Negative.   Skin: Negative.   Allergic/Immunologic: Negative.   Neurological: Negative.   Hematological: Negative.   Psychiatric/Behavioral: Negative.        Objective:   Physical Exam  Constitutional: She is oriented to person, place, and time. She appears well-developed and well-nourished. No distress.  Elderly and hearing impaired in a wheelchair  HENT:  Head: Normocephalic.  Right Ear: External ear normal.  Left Ear: External ear normal.  Mouth/Throat: Oropharynx is clear and moist. No oropharyngeal exudate.  Facial contusion appears to be healing.  Bruising more apparent on the nose and right side of the face.  Eyes: Pupils are equal, round, and reactive to light. Conjunctivae and EOM are normal. Right eye exhibits no discharge. Left eye exhibits no discharge. No scleral icterus.  Neck: Normal range of motion. Neck supple. No thyromegaly present.  Cardiovascular: Normal rate and normal heart sounds.  No murmur heard. Irregular rhythm today at 84/min  Pulmonary/Chest: Effort normal and breath sounds normal. She has no wheezes. She has no rales.  Clear anteriorly and posteriorly  Abdominal: Soft. Bowel sounds are normal. She exhibits no mass. There is no tenderness. There is no guarding.  Musculoskeletal: She exhibits edema and tenderness.  Bruising that appears to be resolving below the right knee.  Definite osteoarthritis both knees.  Lymphadenopathy:    She has no cervical adenopathy.  Neurological: She is alert and oriented to person, place, and time. She has normal reflexes. No cranial nerve deficit.  Skin: Skin is warm and dry. No rash noted.  Psychiatric: She has a normal mood and affect. Her behavior is normal. Judgment and thought content normal.  Mood affect and behavior are stable for this patient with mild cognitive impairment.  Nursing note and vitals reviewed.  BP 128/77 (BP Location: Left Arm)   Pulse 85   Temp (!) 97 F (36.1 C) (Oral)   Ht '5\' 3"'  (1.6 m)    Wt 122 lb (55.3 kg)   BMI 21.61 kg/m         Assessment & Plan:  1. Hospital discharge follow-up -Still trying to encourage the patient to go to an extended care facility. - BMP8+EGFR - CBC with Differential/Platelet - Hepatic function panel  2. Slow transit constipation -Drink more water and add Colace 1 daily to patient's medication regimen  Patient Instructions  The patient must continue to use her walker regularly and not be in a hurry. She should still consider going to some type of care facility where she can have more assistance 24/7.    Take Colace over-the-counter to help with constipation Drink more water  Arrie Senate MD

## 2017-11-20 NOTE — Patient Instructions (Addendum)
The patient must continue to use her walker regularly and not be in a hurry. She should still consider going to some type of care facility where she can have more assistance 24/7.    Take Colace over-the-counter to help with constipation Drink more water

## 2017-11-21 DIAGNOSIS — M170 Bilateral primary osteoarthritis of knee: Secondary | ICD-10-CM | POA: Diagnosis not present

## 2017-11-21 DIAGNOSIS — I5041 Acute combined systolic (congestive) and diastolic (congestive) heart failure: Secondary | ICD-10-CM | POA: Diagnosis not present

## 2017-11-21 DIAGNOSIS — I110 Hypertensive heart disease with heart failure: Secondary | ICD-10-CM | POA: Diagnosis not present

## 2017-11-21 DIAGNOSIS — M810 Age-related osteoporosis without current pathological fracture: Secondary | ICD-10-CM | POA: Diagnosis not present

## 2017-11-21 DIAGNOSIS — I4891 Unspecified atrial fibrillation: Secondary | ICD-10-CM | POA: Diagnosis not present

## 2017-11-21 DIAGNOSIS — H9190 Unspecified hearing loss, unspecified ear: Secondary | ICD-10-CM | POA: Diagnosis not present

## 2017-11-21 LAB — CBC WITH DIFFERENTIAL/PLATELET
BASOS ABS: 0 10*3/uL (ref 0.0–0.2)
Basos: 0 %
EOS (ABSOLUTE): 0.1 10*3/uL (ref 0.0–0.4)
Eos: 2 %
HEMATOCRIT: 39.5 % (ref 34.0–46.6)
Hemoglobin: 13 g/dL (ref 11.1–15.9)
IMMATURE GRANS (ABS): 0 10*3/uL (ref 0.0–0.1)
IMMATURE GRANULOCYTES: 0 %
LYMPHS ABS: 1.5 10*3/uL (ref 0.7–3.1)
LYMPHS: 25 %
MCH: 32.3 pg (ref 26.6–33.0)
MCHC: 32.9 g/dL (ref 31.5–35.7)
MCV: 98 fL — ABNORMAL HIGH (ref 79–97)
Monocytes Absolute: 0.4 10*3/uL (ref 0.1–0.9)
Monocytes: 7 %
NEUTROS PCT: 66 %
Neutrophils Absolute: 3.9 10*3/uL (ref 1.4–7.0)
PLATELETS: 262 10*3/uL (ref 150–450)
RBC: 4.03 x10E6/uL (ref 3.77–5.28)
RDW: 13.7 % (ref 12.3–15.4)
WBC: 5.9 10*3/uL (ref 3.4–10.8)

## 2017-11-21 LAB — BMP8+EGFR
BUN/Creatinine Ratio: 15 (ref 12–28)
BUN: 12 mg/dL (ref 10–36)
CALCIUM: 8.9 mg/dL (ref 8.7–10.3)
CHLORIDE: 103 mmol/L (ref 96–106)
CO2: 25 mmol/L (ref 20–29)
Creatinine, Ser: 0.78 mg/dL (ref 0.57–1.00)
GFR calc Af Amer: 72 mL/min/{1.73_m2} (ref >59)
GFR calc non Af Amer: 63 mL/min/{1.73_m2} (ref >59)
GLUCOSE: 117 mg/dL — AB (ref 65–99)
Potassium: 4.6 mmol/L (ref 3.5–5.2)
Sodium: 142 mmol/L (ref 134–144)

## 2017-11-21 LAB — HEPATIC FUNCTION PANEL
ALBUMIN: 3.7 g/dL (ref 3.2–4.6)
ALT: 11 IU/L (ref 0–32)
AST: 16 IU/L (ref 0–40)
Alkaline Phosphatase: 92 IU/L (ref 39–117)
Bilirubin Total: 0.8 mg/dL (ref 0.0–1.2)
Bilirubin, Direct: 0.25 mg/dL (ref 0.00–0.40)
Total Protein: 5.8 g/dL — ABNORMAL LOW (ref 6.0–8.5)

## 2017-11-22 DIAGNOSIS — I5041 Acute combined systolic (congestive) and diastolic (congestive) heart failure: Secondary | ICD-10-CM | POA: Diagnosis not present

## 2017-11-22 DIAGNOSIS — M810 Age-related osteoporosis without current pathological fracture: Secondary | ICD-10-CM | POA: Diagnosis not present

## 2017-11-22 DIAGNOSIS — I110 Hypertensive heart disease with heart failure: Secondary | ICD-10-CM | POA: Diagnosis not present

## 2017-11-22 DIAGNOSIS — M170 Bilateral primary osteoarthritis of knee: Secondary | ICD-10-CM | POA: Diagnosis not present

## 2017-11-22 DIAGNOSIS — I4891 Unspecified atrial fibrillation: Secondary | ICD-10-CM | POA: Diagnosis not present

## 2017-11-22 DIAGNOSIS — H9190 Unspecified hearing loss, unspecified ear: Secondary | ICD-10-CM | POA: Diagnosis not present

## 2017-11-23 DIAGNOSIS — I4891 Unspecified atrial fibrillation: Secondary | ICD-10-CM | POA: Diagnosis not present

## 2017-11-23 DIAGNOSIS — I5041 Acute combined systolic (congestive) and diastolic (congestive) heart failure: Secondary | ICD-10-CM | POA: Diagnosis not present

## 2017-11-23 DIAGNOSIS — I110 Hypertensive heart disease with heart failure: Secondary | ICD-10-CM | POA: Diagnosis not present

## 2017-11-23 DIAGNOSIS — M810 Age-related osteoporosis without current pathological fracture: Secondary | ICD-10-CM | POA: Diagnosis not present

## 2017-11-23 DIAGNOSIS — H9190 Unspecified hearing loss, unspecified ear: Secondary | ICD-10-CM | POA: Diagnosis not present

## 2017-11-23 DIAGNOSIS — M170 Bilateral primary osteoarthritis of knee: Secondary | ICD-10-CM | POA: Diagnosis not present

## 2017-11-28 DIAGNOSIS — H9190 Unspecified hearing loss, unspecified ear: Secondary | ICD-10-CM | POA: Diagnosis not present

## 2017-11-28 DIAGNOSIS — M810 Age-related osteoporosis without current pathological fracture: Secondary | ICD-10-CM | POA: Diagnosis not present

## 2017-11-28 DIAGNOSIS — I110 Hypertensive heart disease with heart failure: Secondary | ICD-10-CM | POA: Diagnosis not present

## 2017-11-28 DIAGNOSIS — M170 Bilateral primary osteoarthritis of knee: Secondary | ICD-10-CM | POA: Diagnosis not present

## 2017-11-28 DIAGNOSIS — I4891 Unspecified atrial fibrillation: Secondary | ICD-10-CM | POA: Diagnosis not present

## 2017-11-28 DIAGNOSIS — I5041 Acute combined systolic (congestive) and diastolic (congestive) heart failure: Secondary | ICD-10-CM | POA: Diagnosis not present

## 2017-12-07 DIAGNOSIS — H9190 Unspecified hearing loss, unspecified ear: Secondary | ICD-10-CM | POA: Diagnosis not present

## 2017-12-07 DIAGNOSIS — I5041 Acute combined systolic (congestive) and diastolic (congestive) heart failure: Secondary | ICD-10-CM | POA: Diagnosis not present

## 2017-12-07 DIAGNOSIS — M170 Bilateral primary osteoarthritis of knee: Secondary | ICD-10-CM | POA: Diagnosis not present

## 2017-12-07 DIAGNOSIS — I110 Hypertensive heart disease with heart failure: Secondary | ICD-10-CM | POA: Diagnosis not present

## 2017-12-07 DIAGNOSIS — M810 Age-related osteoporosis without current pathological fracture: Secondary | ICD-10-CM | POA: Diagnosis not present

## 2017-12-07 DIAGNOSIS — I4891 Unspecified atrial fibrillation: Secondary | ICD-10-CM | POA: Diagnosis not present

## 2017-12-12 DIAGNOSIS — I110 Hypertensive heart disease with heart failure: Secondary | ICD-10-CM | POA: Diagnosis not present

## 2017-12-12 DIAGNOSIS — M810 Age-related osteoporosis without current pathological fracture: Secondary | ICD-10-CM | POA: Diagnosis not present

## 2017-12-12 DIAGNOSIS — I4891 Unspecified atrial fibrillation: Secondary | ICD-10-CM | POA: Diagnosis not present

## 2017-12-12 DIAGNOSIS — H9190 Unspecified hearing loss, unspecified ear: Secondary | ICD-10-CM | POA: Diagnosis not present

## 2017-12-12 DIAGNOSIS — I5041 Acute combined systolic (congestive) and diastolic (congestive) heart failure: Secondary | ICD-10-CM | POA: Diagnosis not present

## 2017-12-12 DIAGNOSIS — M170 Bilateral primary osteoarthritis of knee: Secondary | ICD-10-CM | POA: Diagnosis not present

## 2017-12-16 ENCOUNTER — Other Ambulatory Visit: Payer: Self-pay | Admitting: Family Medicine

## 2017-12-21 DIAGNOSIS — H9190 Unspecified hearing loss, unspecified ear: Secondary | ICD-10-CM | POA: Diagnosis not present

## 2017-12-21 DIAGNOSIS — M810 Age-related osteoporosis without current pathological fracture: Secondary | ICD-10-CM | POA: Diagnosis not present

## 2017-12-21 DIAGNOSIS — I5041 Acute combined systolic (congestive) and diastolic (congestive) heart failure: Secondary | ICD-10-CM | POA: Diagnosis not present

## 2017-12-21 DIAGNOSIS — I110 Hypertensive heart disease with heart failure: Secondary | ICD-10-CM | POA: Diagnosis not present

## 2017-12-21 DIAGNOSIS — M170 Bilateral primary osteoarthritis of knee: Secondary | ICD-10-CM | POA: Diagnosis not present

## 2017-12-21 DIAGNOSIS — I4891 Unspecified atrial fibrillation: Secondary | ICD-10-CM | POA: Diagnosis not present

## 2017-12-25 ENCOUNTER — Other Ambulatory Visit: Payer: Self-pay | Admitting: Family Medicine

## 2017-12-26 ENCOUNTER — Telehealth: Payer: Self-pay | Admitting: Family Medicine

## 2017-12-26 ENCOUNTER — Other Ambulatory Visit: Payer: Self-pay | Admitting: Family Medicine

## 2017-12-26 DIAGNOSIS — I110 Hypertensive heart disease with heart failure: Secondary | ICD-10-CM | POA: Diagnosis not present

## 2017-12-26 DIAGNOSIS — I5041 Acute combined systolic (congestive) and diastolic (congestive) heart failure: Secondary | ICD-10-CM | POA: Diagnosis not present

## 2017-12-26 DIAGNOSIS — M810 Age-related osteoporosis without current pathological fracture: Secondary | ICD-10-CM | POA: Diagnosis not present

## 2017-12-26 DIAGNOSIS — M170 Bilateral primary osteoarthritis of knee: Secondary | ICD-10-CM | POA: Diagnosis not present

## 2017-12-26 DIAGNOSIS — I4891 Unspecified atrial fibrillation: Secondary | ICD-10-CM | POA: Diagnosis not present

## 2017-12-26 DIAGNOSIS — H9190 Unspecified hearing loss, unspecified ear: Secondary | ICD-10-CM | POA: Diagnosis not present

## 2017-12-26 MED ORDER — APIXABAN 2.5 MG PO TABS: 2.5000 mg | ORAL_TABLET | Freq: Two times a day (BID) | ORAL | 0 refills | Status: AC

## 2017-12-26 MED ORDER — CARVEDILOL 3.125 MG PO TABS: 3.1250 mg | ORAL_TABLET | Freq: Two times a day (BID) | ORAL | 0 refills | Status: DC

## 2017-12-26 NOTE — Telephone Encounter (Signed)
Aware refills sent to pharmacy 

## 2017-12-27 MED ORDER — LISINOPRIL 5 MG PO TABS: 5.0000 mg | ORAL_TABLET | Freq: Every day | ORAL | 3 refills | Status: AC

## 2017-12-27 NOTE — Telephone Encounter (Signed)
correct med ordered

## 2017-12-31 DIAGNOSIS — M810 Age-related osteoporosis without current pathological fracture: Secondary | ICD-10-CM | POA: Diagnosis not present

## 2017-12-31 DIAGNOSIS — Z9181 History of falling: Secondary | ICD-10-CM | POA: Diagnosis not present

## 2017-12-31 DIAGNOSIS — I5041 Acute combined systolic (congestive) and diastolic (congestive) heart failure: Secondary | ICD-10-CM | POA: Diagnosis not present

## 2017-12-31 DIAGNOSIS — H9190 Unspecified hearing loss, unspecified ear: Secondary | ICD-10-CM | POA: Diagnosis not present

## 2017-12-31 DIAGNOSIS — Z87440 Personal history of urinary (tract) infections: Secondary | ICD-10-CM | POA: Diagnosis not present

## 2017-12-31 DIAGNOSIS — H547 Unspecified visual loss: Secondary | ICD-10-CM | POA: Diagnosis not present

## 2017-12-31 DIAGNOSIS — Z7901 Long term (current) use of anticoagulants: Secondary | ICD-10-CM | POA: Diagnosis not present

## 2017-12-31 DIAGNOSIS — I4891 Unspecified atrial fibrillation: Secondary | ICD-10-CM | POA: Diagnosis not present

## 2017-12-31 DIAGNOSIS — M170 Bilateral primary osteoarthritis of knee: Secondary | ICD-10-CM | POA: Diagnosis not present

## 2017-12-31 DIAGNOSIS — I110 Hypertensive heart disease with heart failure: Secondary | ICD-10-CM | POA: Diagnosis not present

## 2018-01-09 ENCOUNTER — Other Ambulatory Visit: Payer: Self-pay | Admitting: Family Medicine

## 2018-01-23 DIAGNOSIS — M810 Age-related osteoporosis without current pathological fracture: Secondary | ICD-10-CM | POA: Diagnosis not present

## 2018-01-23 DIAGNOSIS — I4891 Unspecified atrial fibrillation: Secondary | ICD-10-CM | POA: Diagnosis not present

## 2018-01-23 DIAGNOSIS — I5041 Acute combined systolic (congestive) and diastolic (congestive) heart failure: Secondary | ICD-10-CM | POA: Diagnosis not present

## 2018-01-23 DIAGNOSIS — H9190 Unspecified hearing loss, unspecified ear: Secondary | ICD-10-CM | POA: Diagnosis not present

## 2018-01-23 DIAGNOSIS — M170 Bilateral primary osteoarthritis of knee: Secondary | ICD-10-CM | POA: Diagnosis not present

## 2018-01-23 DIAGNOSIS — I110 Hypertensive heart disease with heart failure: Secondary | ICD-10-CM | POA: Diagnosis not present

## 2018-01-28 DIAGNOSIS — Z23 Encounter for immunization: Secondary | ICD-10-CM | POA: Diagnosis not present

## 2018-02-27 DIAGNOSIS — I5041 Acute combined systolic (congestive) and diastolic (congestive) heart failure: Secondary | ICD-10-CM | POA: Diagnosis not present

## 2018-02-27 DIAGNOSIS — M170 Bilateral primary osteoarthritis of knee: Secondary | ICD-10-CM | POA: Diagnosis not present

## 2018-02-27 DIAGNOSIS — H9190 Unspecified hearing loss, unspecified ear: Secondary | ICD-10-CM | POA: Diagnosis not present

## 2018-02-27 DIAGNOSIS — I110 Hypertensive heart disease with heart failure: Secondary | ICD-10-CM | POA: Diagnosis not present

## 2018-02-27 DIAGNOSIS — M810 Age-related osteoporosis without current pathological fracture: Secondary | ICD-10-CM | POA: Diagnosis not present

## 2018-02-27 DIAGNOSIS — I4891 Unspecified atrial fibrillation: Secondary | ICD-10-CM | POA: Diagnosis not present

## 2018-03-06 ENCOUNTER — Ambulatory Visit (INDEPENDENT_AMBULATORY_CARE_PROVIDER_SITE_OTHER): Payer: Medicare Other

## 2018-03-06 DIAGNOSIS — I110 Hypertensive heart disease with heart failure: Secondary | ICD-10-CM | POA: Diagnosis not present

## 2018-03-06 DIAGNOSIS — M170 Bilateral primary osteoarthritis of knee: Secondary | ICD-10-CM

## 2018-03-06 DIAGNOSIS — M810 Age-related osteoporosis without current pathological fracture: Secondary | ICD-10-CM

## 2018-03-06 DIAGNOSIS — I5041 Acute combined systolic (congestive) and diastolic (congestive) heart failure: Secondary | ICD-10-CM | POA: Diagnosis not present

## 2018-03-06 DIAGNOSIS — I4891 Unspecified atrial fibrillation: Secondary | ICD-10-CM

## 2018-03-12 ENCOUNTER — Inpatient Hospital Stay (HOSPITAL_COMMUNITY): Payer: Medicare Other

## 2018-03-12 ENCOUNTER — Emergency Department (HOSPITAL_COMMUNITY): Payer: Medicare Other

## 2018-03-12 ENCOUNTER — Encounter (HOSPITAL_COMMUNITY): Payer: Self-pay

## 2018-03-12 ENCOUNTER — Other Ambulatory Visit: Payer: Self-pay

## 2018-03-12 ENCOUNTER — Inpatient Hospital Stay (HOSPITAL_COMMUNITY)
Admission: EM | Admit: 2018-03-12 | Discharge: 2018-03-15 | DRG: 194 | Disposition: A | Discharge status: Skilled Nursing Facility | Payer: Medicare Other | Attending: Family Medicine | Admitting: Family Medicine

## 2018-03-12 DIAGNOSIS — H9190 Unspecified hearing loss, unspecified ear: Secondary | ICD-10-CM

## 2018-03-12 DIAGNOSIS — I4891 Unspecified atrial fibrillation: Secondary | ICD-10-CM | POA: Diagnosis not present

## 2018-03-12 DIAGNOSIS — M810 Age-related osteoporosis without current pathological fracture: Secondary | ICD-10-CM | POA: Diagnosis present

## 2018-03-12 DIAGNOSIS — R8271 Bacteriuria: Secondary | ICD-10-CM | POA: Diagnosis present

## 2018-03-12 DIAGNOSIS — D72829 Elevated white blood cell count, unspecified: Secondary | ICD-10-CM | POA: Diagnosis present

## 2018-03-12 DIAGNOSIS — R531 Weakness: Secondary | ICD-10-CM

## 2018-03-12 DIAGNOSIS — H547 Unspecified visual loss: Secondary | ICD-10-CM

## 2018-03-12 DIAGNOSIS — Z9181 History of falling: Secondary | ICD-10-CM

## 2018-03-12 DIAGNOSIS — I10 Essential (primary) hypertension: Secondary | ICD-10-CM | POA: Diagnosis not present

## 2018-03-12 DIAGNOSIS — R8281 Pyuria: Secondary | ICD-10-CM | POA: Diagnosis not present

## 2018-03-12 DIAGNOSIS — E860 Dehydration: Secondary | ICD-10-CM | POA: Diagnosis present

## 2018-03-12 DIAGNOSIS — M6281 Muscle weakness (generalized): Secondary | ICD-10-CM | POA: Diagnosis not present

## 2018-03-12 DIAGNOSIS — F419 Anxiety disorder, unspecified: Secondary | ICD-10-CM | POA: Diagnosis not present

## 2018-03-12 DIAGNOSIS — R4182 Altered mental status, unspecified: Secondary | ICD-10-CM | POA: Diagnosis not present

## 2018-03-12 DIAGNOSIS — E785 Hyperlipidemia, unspecified: Secondary | ICD-10-CM | POA: Diagnosis present

## 2018-03-12 DIAGNOSIS — Z888 Allergy status to other drugs, medicaments and biological substances status: Secondary | ICD-10-CM

## 2018-03-12 DIAGNOSIS — Z66 Do not resuscitate: Secondary | ICD-10-CM | POA: Diagnosis present

## 2018-03-12 DIAGNOSIS — E7800 Pure hypercholesterolemia, unspecified: Secondary | ICD-10-CM | POA: Diagnosis present

## 2018-03-12 DIAGNOSIS — J189 Pneumonia, unspecified organism: Secondary | ICD-10-CM | POA: Diagnosis not present

## 2018-03-12 DIAGNOSIS — J90 Pleural effusion, not elsewhere classified: Secondary | ICD-10-CM | POA: Diagnosis not present

## 2018-03-12 DIAGNOSIS — Z7901 Long term (current) use of anticoagulants: Secondary | ICD-10-CM

## 2018-03-12 DIAGNOSIS — I6523 Occlusion and stenosis of bilateral carotid arteries: Secondary | ICD-10-CM | POA: Diagnosis not present

## 2018-03-12 DIAGNOSIS — Z79899 Other long term (current) drug therapy: Secondary | ICD-10-CM

## 2018-03-12 DIAGNOSIS — R55 Syncope and collapse: Secondary | ICD-10-CM | POA: Diagnosis not present

## 2018-03-12 DIAGNOSIS — Z8719 Personal history of other diseases of the digestive system: Secondary | ICD-10-CM

## 2018-03-12 DIAGNOSIS — I482 Chronic atrial fibrillation, unspecified: Secondary | ICD-10-CM | POA: Diagnosis present

## 2018-03-12 DIAGNOSIS — E876 Hypokalemia: Secondary | ICD-10-CM | POA: Diagnosis not present

## 2018-03-12 DIAGNOSIS — K5909 Other constipation: Secondary | ICD-10-CM | POA: Diagnosis not present

## 2018-03-12 DIAGNOSIS — R509 Fever, unspecified: Secondary | ICD-10-CM | POA: Diagnosis not present

## 2018-03-12 DIAGNOSIS — H269 Unspecified cataract: Secondary | ICD-10-CM | POA: Diagnosis present

## 2018-03-12 DIAGNOSIS — M1990 Unspecified osteoarthritis, unspecified site: Secondary | ICD-10-CM | POA: Diagnosis present

## 2018-03-12 DIAGNOSIS — M170 Bilateral primary osteoarthritis of knee: Secondary | ICD-10-CM | POA: Diagnosis present

## 2018-03-12 DIAGNOSIS — J181 Lobar pneumonia, unspecified organism: Secondary | ICD-10-CM

## 2018-03-12 DIAGNOSIS — K5900 Constipation, unspecified: Secondary | ICD-10-CM | POA: Diagnosis not present

## 2018-03-12 DIAGNOSIS — Z7401 Bed confinement status: Secondary | ICD-10-CM | POA: Diagnosis not present

## 2018-03-12 LAB — CBC WITH DIFFERENTIAL/PLATELET
Abs Immature Granulocytes: 0.07 10*3/uL (ref 0.00–0.07)
BASOS PCT: 0 %
Basophils Absolute: 0 10*3/uL (ref 0.0–0.1)
EOS ABS: 0.1 10*3/uL (ref 0.0–0.5)
Eosinophils Relative: 1 %
HCT: 44.8 % (ref 36.0–46.0)
Hemoglobin: 14.5 g/dL (ref 12.0–15.0)
IMMATURE GRANULOCYTES: 0 %
Lymphocytes Relative: 10 %
Lymphs Abs: 1.6 10*3/uL (ref 0.7–4.0)
MCH: 32 pg (ref 26.0–34.0)
MCHC: 32.4 g/dL (ref 30.0–36.0)
MCV: 98.9 fL (ref 80.0–100.0)
MONOS PCT: 5 %
Monocytes Absolute: 0.8 10*3/uL (ref 0.1–1.0)
NEUTROS ABS: 13.1 10*3/uL — AB (ref 1.7–7.7)
NEUTROS PCT: 84 %
NRBC: 0 % (ref 0.0–0.2)
PLATELETS: 229 10*3/uL (ref 150–400)
RBC: 4.53 MIL/uL (ref 3.87–5.11)
RDW: 13.1 % (ref 11.5–15.5)
WBC: 15.8 10*3/uL — AB (ref 4.0–10.5)

## 2018-03-12 LAB — URINALYSIS, ROUTINE W REFLEX MICROSCOPIC
BILIRUBIN URINE: NEGATIVE
Glucose, UA: NEGATIVE mg/dL
Hgb urine dipstick: NEGATIVE
Ketones, ur: NEGATIVE mg/dL
Leukocytes, UA: NEGATIVE
NITRITE: POSITIVE — AB
PH: 5 (ref 5.0–8.0)
Protein, ur: NEGATIVE mg/dL
SPECIFIC GRAVITY, URINE: 1.018 (ref 1.005–1.030)

## 2018-03-12 LAB — BASIC METABOLIC PANEL
Anion gap: 9 (ref 5–15)
BUN: 17 mg/dL (ref 8–23)
CALCIUM: 8.6 mg/dL — AB (ref 8.9–10.3)
CO2: 28 mmol/L (ref 22–32)
Chloride: 103 mmol/L (ref 98–111)
Creatinine, Ser: 0.75 mg/dL (ref 0.44–1.00)
GFR calc Af Amer: >60 mL/min (ref >60)
GLUCOSE: 131 mg/dL — AB (ref 70–99)
Potassium: 3.6 mmol/L (ref 3.5–5.1)
SODIUM: 140 mmol/L (ref 135–145)

## 2018-03-12 LAB — I-STAT TROPONIN, ED: TROPONIN I, POC: 0.03 ng/mL (ref 0.00–0.08)

## 2018-03-12 LAB — TSH: TSH: 1.441 u[IU]/mL (ref 0.350–4.500)

## 2018-03-12 MED ORDER — SODIUM CHLORIDE 0.9 % IV SOLN
1.0000 g | INTRAVENOUS | Status: DC
  Administered 2018-03-13 – 2018-03-14 (×2): 1 g via INTRAVENOUS
  Filled 2018-03-12: qty 1
  Filled 2018-03-12 (×2): qty 10
  Filled 2018-03-12: qty 1
  Filled 2018-03-12 (×2): qty 10

## 2018-03-12 MED ORDER — SODIUM CHLORIDE 0.9 % IV BOLUS
500.0000 mL | Freq: Once | INTRAVENOUS | Status: AC
  Administered 2018-03-12: 500 mL via INTRAVENOUS

## 2018-03-12 MED ORDER — SODIUM CHLORIDE 0.9 % IV SOLN
1.0000 g | Freq: Once | INTRAVENOUS | Status: AC
  Administered 2018-03-12: 1 g via INTRAVENOUS
  Filled 2018-03-12: qty 10

## 2018-03-12 MED ORDER — AZITHROMYCIN 500 MG IV SOLR
500.0000 mg | Freq: Once | INTRAVENOUS | Status: AC
  Administered 2018-03-12: 500 mg via INTRAVENOUS
  Filled 2018-03-12: qty 500

## 2018-03-12 MED ORDER — SODIUM CHLORIDE 0.9 % IV SOLN
INTRAVENOUS | Status: AC
  Administered 2018-03-12: 19:00:00 via INTRAVENOUS

## 2018-03-12 MED ORDER — AZITHROMYCIN 250 MG PO TABS
500.0000 mg | ORAL_TABLET | ORAL | Status: DC
  Administered 2018-03-13 – 2018-03-14 (×2): 500 mg via ORAL
  Filled 2018-03-12 (×2): qty 2

## 2018-03-12 NOTE — Progress Notes (Signed)
Patient's family is requesting placement at skilled nursing facility preference is [facility]. MD has been E-Paged.

## 2018-03-12 NOTE — ED Triage Notes (Signed)
Ems says caregiver called ems because pt slumped over in chair and hard to arouse.   When ems arrived pt was alert and oriented.  No complaints.  Family says they've noticed pt has been moving slower for the past 3 or 4 days.  Also noticed for the past week her r leg has been weaker,

## 2018-03-12 NOTE — ED Notes (Signed)
Dr. Vanita Panda aware that orders for blood cultures were ordered after antibiotics were running. Reported that Hospitalist ordered

## 2018-03-12 NOTE — H&P (Signed)
History and Physical  Marissa Burns CBJ:[id number] DOB: 1914/08/14 DOA: 03/12/2018  Referring physician: Vanita Panda MD PCP: Chipper Herb, MD   Chief Complaint: brief loss of consciousness   HPI: Marissa Burns is a 84 y.o. female with a past medical history of hypertension, diverticulosis, elevated blood sugar and hypercholesterolemia presented to the emergency department from home by EMS after having an episode where she slumped over in her chair and became difficult to arouse for several minutes.  This lasted approximately 5 minutes and the patient became awake and alert and oriented.  The family reported that the patient had been moving much slower over the past 3 to 4 days and also for the past week she has been having noticeable weakness in the right lower extremity and has been generally weaker than normal.  The patient had been walking with a walker up until about 1 day prior to arrival. The patient had been living at home alone and caring for herself according to her sister.  The patient's 2 sons have died and she has grandchildren that are alive.  Her 46 year old sister mostly care for her and checks on her.  The patient spent 2 months in [place] last year and has been at home ever since.  The patient had been vomiting most of yesterday and had said that she needed to see a physician.  The patient is very hard of hearing and mostly can only understand yelling.  She is also visually impaired.    ED course: The patient was evaluated in the ED and noted to be afebrile.  Her blood pressure was elevated at 161/89.  Her pulse ox was 96% on room air.  The patient had some labs that revealed that she had a leukocytosis with a white blood cell count of 15.  The patient was noted to have 11-20 white blood cells in the urine.  The patient had a chest x-ray that was positive for right middle and lower lobe infiltrates.  The patient is being admitted for further evaluation and  treatment.  Review of Systems: All systems reviewed and apart from history of presenting illness, are negative.  Past Medical History:  Diagnosis Date  . Cataract   . Colon polyp   . Diverticulosis   . Edema   . Elevated blood sugar   . Essential hypertension, benign   . High cholesterol   . Osteoarthritis   . Osteoporosis   . Other and unspecified hyperlipidemia    Past Surgical History:  Procedure Laterality Date  . APPENDECTOMY    . CATARACT EXTRACTION    . TONSILLECTOMY     Social History:  reports that she has never smoked. She has never used smokeless tobacco. She reports that she does not drink alcohol or use drugs.  Allergies  Allergen Reactions  . Alendronate Sodium Other (See Comments)    unknown  . Celebrex [Celecoxib] Other (See Comments)    unknown  . Dilacor [Diltiazem Hcl] Other (See Comments)    unknown  . Evista [Raloxifene Hydrochloride] Other (See Comments)    unknown  . Propulsid [Cisapride] Other (See Comments)    unknown    No family history on file.  Prior to Admission medications   Medication Sig Start Date End Date Taking? Authorizing Provider  acetaminophen (TYLENOL) 500 MG tablet Take 1,000 mg by mouth daily as needed for moderate pain.    Yes [provider]  apixaban (ELIQUIS) 2.5 MG TABS tablet  Take 1 tablet (2.5 mg total) by mouth 2 (two) times daily. 12/26/17  Yes Chipper Herb, MD  atorvastatin (LIPITOR) 80 MG tablet TAKE 1 TABLET (80 MG TOTAL) BY MOUTH DAILY. 01/10/18  Yes Chipper Herb, MD  busPIRone (BUSPAR) 5 MG tablet TAKE ONE TABLET BY MOUTH TWICE DAILY FOR ANXIETY 12/17/17  Yes Chipper Herb, MD  calcium carbonate (TUMS - DOSED IN MG ELEMENTAL CALCIUM) 500 MG chewable tablet Chew 1 tablet by mouth daily.     Yes [provider]  carvedilol (COREG) 3.125 MG tablet Take 1 tablet (3.125 mg total) by mouth 2 (two) times daily with a meal. 12/26/17  Yes Chipper Herb, MD  Cholecalciferol (VITAMIN D3) 2000 UNITS  TABS Take 2 tablets by mouth daily after breakfast.    Yes [provider]  furosemide (LASIX) 20 MG tablet Take 1 tablet (20 mg total) by mouth daily. 10/29/17  Yes Tat, Shanon Brow, MD  lisinopril (PRINIVIL,ZESTRIL) 5 MG tablet Take 1 tablet (5 mg total) by mouth daily. 12/27/17  Yes Chipper Herb, MD  Menthol, Topical Analgesic, (BIOFREEZE) 4 % GEL Apply topically BID to effected area. 06/04/17  Yes Chipper Herb, MD  PAZEO 0.7 % SOLN Place 1 drop into both eyes daily.  07/12/16  Yes [provider]  polyethylene glycol (MIRALAX / GLYCOLAX) packet Take 17 g by mouth daily.   Yes [provider]  amLODipine (NORVASC) 5 MG tablet TAKE 1 TABLET (5 MG TOTAL) BY MOUTH DAILY. 01/10/18   Chipper Herb, MD   Physical Exam: Vitals:   03/12/18 1206 03/12/18 1219 03/12/18 1422  BP:  (!) 161/89 98/87  Pulse:  77   Resp:  15   Temp:  97.8 F (36.6 C)   TempSrc:  Oral   SpO2:  96%   Weight: 59 kg       General exam: Pt appears age, appears very weak and very hard of hearing.  Moderately built and nourished patient, lying comfortably supine on the gurney in no obvious distress.  Head, eyes and ENT: Nontraumatic and normocephalic. Pupils equally reacting to light and accommodation. Oral mucosa moist.  Neck: Supple. No JVD, carotid bruit or thyromegaly.  Lymphatics: No lymphadenopathy.  Respiratory system: rales RLL.  No increased work of breathing.  Cardiovascular system: S1 and S2 heard, irregularly irregular.  Gastrointestinal system: Abdomen is nondistended, soft and nontender. Normal bowel sounds heard. No organomegaly or masses appreciated.  Central nervous system: Alert and oriented. No focal neurological deficits.  Extremities: Symmetric 5 x 5 power. Peripheral pulses symmetrically felt.   Skin: No rashes or acute findings.  Musculoskeletal system: Negative exam.  Psychiatry: Pleasant and cooperative.  Labs on Admission:  Basic Metabolic Panel: Recent  Labs  Lab 03/12/18 1230  NA 140  K 3.6  CL 103  CO2 28  GLUCOSE 131*  BUN 17  CREATININE 0.75  CALCIUM 8.6*   Liver Function Tests: No results for input(s): AST, ALT, ALKPHOS, BILITOT, PROT, ALBUMIN in the last 168 hours. No results for input(s): LIPASE, AMYLASE in the last 168 hours. No results for input(s): AMMONIA in the last 168 hours. CBC: Recent Labs  Lab 03/12/18 1230  WBC 15.8*  NEUTROABS 13.1*  HGB 14.5  HCT 44.8  MCV 98.9  PLT 229   Cardiac Enzymes: No results for input(s): CKTOTAL, CKMB, CKMBINDEX, TROPONINI in the last 168 hours.  BNP (last 3 results) No results for input(s): PROBNP in the last 8760 hours. CBG: No  results for input(s): GLUCAP in the last 168 hours.  Radiological Exams on Admission: Dg Chest 2 View  Result Date: 03/12/2018 CLINICAL DATA:  Recent syncopal episode EXAM: CHEST - 2 VIEW COMPARISON:  10/26/2017 FINDINGS: Cardiac shadow is again mildly enlarged. Aortic calcifications are again seen. Increasing right middle lobe and right lower lobe infiltrate are seen. Associated small effusion is noted. Small left pleural effusion is noted as well. IMPRESSION: Increasing right basilar infiltrate involving the middle and lower lobes with bilateral small effusions. Electronically Signed   By: Inez Catalina M.D.   On: 03/12/2018 13:34   Ct Head Wo Contrast  Result Date: 03/12/2018 CLINICAL DATA:  84 year old female with altered mental status. Currently on Eliquis. EXAM: CT HEAD WITHOUT CONTRAST TECHNIQUE: Contiguous axial images were obtained from the base of the skull through the vertex without intravenous contrast. COMPARISON:  11/12/2017 and prior CTs FINDINGS: Brain: No evidence of acute infarction, hemorrhage, hydrocephalus, extra-axial collection or mass lesion/mass effect. Atrophy, chronic small-vessel white matter ischemic changes and remote LEFT cerebellar infarct again noted. Vascular: Vertebral and carotid atherosclerotic calcifications again  noted. Skull: Normal. Negative for fracture or focal lesion. Sinuses/Orbits: No acute finding. Other: None. IMPRESSION: 1. No evidence of acute intracranial abnormality 2. Atrophy, chronic small-vessel white matter ischemic changes and remote LEFT cerebellar infarct. Electronically Signed   By: Margarette Canada M.D.   On: 03/12/2018 14:01   EKG: Independently reviewed. Chronic atrial fibrillation.   Assessment/Plan Active Problems:   Cataract   Arthritis   Chronic constipation   Primary osteoarthritis of both knees   Visual impairment   Weakness   Syncope and collapse   Leukocytosis   Dehydration   Hard of hearing   At high risk for falls  1. Generalized weakness-likely secondary to the pneumonia and mild dehydration.  Treating pneumonia with IV antibiotic therapy.  I have requested blood cultures to be obtained.  The patient does not appear septic at this time.  Continue to follow.  PT evaluation ordered and requested.  Check a TSH. 2. Syncope and collapse-patient had an episode where she slumped over for 5 minutes and was unarousable however she quickly recovered.  She did have an echocardiogram done in July of this year with a normal EF noted and grade 1 diastolic dysfunction.  We will obtain carotid Doppler studies.  We will monitor the patient on telemetry overnight to observe for any other or additional cardiac arrhythmias. 3. Chronic constipation- we will provide laxatives as needed. 4. At risk for falls-given advanced age and comorbidities, fall precautions have been requested.  PT evaluation requested. 5. Hard of hearing and visual impairment. 6. Mild dehydration-we will treat with gentle IV fluids overnight. 7. Osteoarthritis- Tylenol as needed for symptoms. 8. Chronic atrial fibrillation-patient is fully anticoagulated with apixaban which we will continue in the hospital.  Rate is very well controlled.  Follow on telemetry. 9. Abnormal UA - send urine for culture.  Suspect this is ASB  and does not need treatment.   DVT Prophylaxis: Apixaban Code Status: DNR Family Communication: Sister updated at the bedside Disposition Plan: Inpatient care required as noted above, PT eval for possible  Need for sNF placement.   Time spent: 56 minutes  Irwin Brakeman, MD [facility] Pager [phone]  If 7PM-7AM, please contact night-coverage [email or web address] Password [email or web address] 03/12/2018, 2:27 PM

## 2018-03-12 NOTE — ED Provider Notes (Signed)
[facility] EMERGENCY DEPARTMENT Provider Note   CSN: [id number] Arrival date & time: 03/12/18  1159     History   Chief Complaint Chief Complaint  Patient presents with  . Loss of Consciousness    HPI Marissa Burns is a 84 y.o. female.  HPI Patient presents after an episode of syncope. Patient is elderly, has very poor hearing, and history provided by EMS providers and family members who notes that the patient was slightly weak recently, but today had an episode of witnessed syncope, without apparent fall. The patient herself denies pain, states that she feels dizzy. No apparent recent changes in medication, diet, activity. She has a noted history of A. fib according to family members, but not according to her medical chart.  Patient denies discomfort, including chest pressure.   Past Medical History:  Diagnosis Date  . Cataract   . Colon polyp   . Diverticulosis   . Edema   . Elevated blood sugar   . Essential hypertension, benign   . High cholesterol   . Osteoarthritis   . Osteoporosis   . Other and unspecified hyperlipidemia     Patient Active Problem List   Diagnosis Date Noted  . Acute systolic CHF (congestive heart failure) ([place]) 10/29/2017  . Acute diastolic CHF (congestive heart failure) ([place]) 10/29/2017  . Urinary tract infection without hematuria   . Weakness 10/26/2017  . Acute CHF (congestive heart failure) ([place]) 10/26/2017  . Acute lower UTI 10/26/2017  . BPPV (benign paroxysmal positional vertigo) 12/10/2014  . Visual impairment 01/22/2014  . Chronic constipation 10/08/2013  . Primary osteoarthritis of both knees 10/08/2013  . Hearing deficit 05/20/2013  . Hypertension 10/30/2012  . Hyperlipemia 10/30/2012  . Vitamin D deficiency 10/30/2012  . Arthritis 10/30/2012  . Colon polyp   . Diverticulosis   . Osteoarthritis   . Edema   . Osteoporosis   . Elevated blood sugar   . Cataract     Past Surgical History:  Procedure Laterality  Date  . APPENDECTOMY    . CATARACT EXTRACTION    . TONSILLECTOMY       OB History   None      Home Medications    Prior to Admission medications   Medication Sig Start Date End Date Taking? Authorizing Provider  acetaminophen (TYLENOL) 500 MG tablet Take 1,000 mg by mouth daily as needed for moderate pain.    Yes [provider]  atorvastatin (LIPITOR) 80 MG tablet TAKE 1 TABLET (80 MG TOTAL) BY MOUTH DAILY. 01/10/18  Yes Chipper Herb, MD  busPIRone (BUSPAR) 5 MG tablet TAKE ONE TABLET BY MOUTH TWICE DAILY FOR ANXIETY 12/17/17  Yes Chipper Herb, MD  calcium carbonate (TUMS - DOSED IN MG ELEMENTAL CALCIUM) 500 MG chewable tablet Chew 1 tablet by mouth daily.     Yes [provider]  carvedilol (COREG) 3.125 MG tablet Take 1 tablet (3.125 mg total) by mouth 2 (two) times daily with a meal. 12/26/17  Yes Chipper Herb, MD  amLODipine (NORVASC) 5 MG tablet TAKE 1 TABLET (5 MG TOTAL) BY MOUTH DAILY. 01/10/18   Chipper Herb, MD  apixaban (ELIQUIS) 2.5 MG TABS tablet Take 1 tablet (2.5 mg total) by mouth 2 (two) times daily. 12/26/17   Chipper Herb, MD  Cholecalciferol (VITAMIN D3) 2000 UNITS TABS Take 2 tablets by mouth daily after breakfast.     [provider]  furosemide (LASIX) 20 MG tablet Take 1 tablet (20 mg  total) by mouth daily. 10/29/17   Orson Eva, MD  lisinopril (PRINIVIL,ZESTRIL) 5 MG tablet Take 1 tablet (5 mg total) by mouth daily. 12/27/17   Chipper Herb, MD  Menthol, Topical Analgesic, (BIOFREEZE) 4 % GEL Apply topically BID to effected area. 06/04/17   Chipper Herb, MD  PAZEO 0.7 % SOLN Place 1 drop into both eyes daily.  07/12/16   [provider]    Family History No family history on file.  Social History Social History   Tobacco Use  . Smoking status: Never Smoker  . Smokeless tobacco: Never Used  Substance Use Topics  . Alcohol use: No  . Drug use: No     Allergies   Alendronate sodium; Celebrex [celecoxib];  Dilacor [diltiazem hcl]; Evista [raloxifene hydrochloride]; and Propulsid [cisapride]   Review of Systems Review of Systems  Constitutional:       Per HPI, otherwise negative  HENT:       Per HPI, otherwise negative  Respiratory:       Per HPI, otherwise negative  Cardiovascular:       Per HPI, otherwise negative  Gastrointestinal: Negative for vomiting.  Endocrine:       Negative aside from HPI  Genitourinary:       Neg aside from HPI   Musculoskeletal:       Per HPI, otherwise negative  Skin: Negative.   Neurological: Positive for syncope and weakness.     Physical Exam Updated Vital Signs BP (!) 161/89 (BP Location: Left Arm)   Pulse 77   Temp 97.8 F (36.6 C) (Oral)   Resp 15   Wt 59 kg   SpO2 96%   BMI 23.03 kg/m   Physical Exam  Constitutional: She is oriented to person, place, and time. She has a sickly appearance. No distress.  HENT:  Head: Normocephalic and atraumatic.  Eyes: Conjunctivae and EOM are normal.  Cardiovascular: Normal rate. An irregularly irregular rhythm present.  Pulmonary/Chest: Effort normal and breath sounds normal. No stridor. No respiratory distress.  Abdominal: She exhibits no distension.  Musculoskeletal: She exhibits no edema.  Neurological: She is oriented to person, place, and time.  Very poor hearing, otherwise cranial nerves unremarkable. Substantial atrophy, diffusely. No gross asymmetry.  Skin: Skin is warm and dry.  Psychiatric: She is withdrawn.  Nursing note and vitals reviewed.    ED Treatments / Results  Labs (all labs ordered are listed, but only abnormal results are displayed) Labs Reviewed  CBC WITH DIFFERENTIAL/PLATELET - Abnormal; Notable for the following components:      Result Value   WBC 15.8 (*)    Neutro Abs 13.1 (*)    All other components within normal limits  BASIC METABOLIC PANEL - Abnormal; Notable for the following components:   Glucose, Bld 131 (*)    Calcium 8.6 (*)    All other  components within normal limits  URINALYSIS, ROUTINE W REFLEX MICROSCOPIC - Abnormal; Notable for the following components:   APPearance HAZY (*)    Nitrite POSITIVE (*)    Bacteria, UA RARE (*)    All other components within normal limits  I-STAT TROPONIN, ED    EKG EKG Interpretation  Date/Time:  Tuesday March 12 2018 12:16:29 EST Ventricular Rate:  78 PR Interval:    QRS Duration: 84 QT Interval:  388 QTC Calculation: 442 R Axis:   84 Text Interpretation:  Atrial fibrillation Ventricular premature complex Borderline right axis deviation Low voltage, extremity and precordial leads Abnormal  ekg Confirmed by Carmin Muskrat [phone]) on 03/12/2018 12:33:52 PM   Radiology Dg Chest 2 View  Result Date: 03/12/2018 CLINICAL DATA:  Recent syncopal episode EXAM: CHEST - 2 VIEW COMPARISON:  10/26/2017 FINDINGS: Cardiac shadow is again mildly enlarged. Aortic calcifications are again seen. Increasing right middle lobe and right lower lobe infiltrate are seen. Associated small effusion is noted. Small left pleural effusion is noted as well. IMPRESSION: Increasing right basilar infiltrate involving the middle and lower lobes with bilateral small effusions. Electronically Signed   By: Inez Catalina M.D.   On: 03/12/2018 13:34   Ct Head Wo Contrast  Result Date: 03/12/2018 CLINICAL DATA:  84 year old female with altered mental status. Currently on Eliquis. EXAM: CT HEAD WITHOUT CONTRAST TECHNIQUE: Contiguous axial images were obtained from the base of the skull through the vertex without intravenous contrast. COMPARISON:  11/12/2017 and prior CTs FINDINGS: Brain: No evidence of acute infarction, hemorrhage, hydrocephalus, extra-axial collection or mass lesion/mass effect. Atrophy, chronic small-vessel white matter ischemic changes and remote LEFT cerebellar infarct again noted. Vascular: Vertebral and carotid atherosclerotic calcifications again noted. Skull: Normal. Negative for fracture or focal  lesion. Sinuses/Orbits: No acute finding. Other: None. IMPRESSION: 1. No evidence of acute intracranial abnormality 2. Atrophy, chronic small-vessel white matter ischemic changes and remote LEFT cerebellar infarct. Electronically Signed   By: Margarette Canada M.D.   On: 03/12/2018 14:01    Procedures Procedures (including critical care time)  Medications Ordered in ED Medications  sodium chloride 0.9 % bolus 500 mL (500 mLs Intravenous New Bag/Given 03/12/18 1341)  cefTRIAXone (ROCEPHIN) 1 g in sodium chloride 0.9 % 100 mL IVPB (has no administration in time range)  azithromycin (ZITHROMAX) 500 mg in sodium chloride 0.9 % 250 mL IVPB (has no administration in time range)     Initial Impression / Assessment and Plan / ED Course  I have reviewed the triage vital signs and the nursing notes.  Pertinent labs & imaging results that were available during my care of the patient were reviewed by me and considered in my medical decision making (see chart for details).    12:58 PM Patient sister has arrived. She notes that the patient has been progressively weak over the past weeks, and has progressed from using a walker as recently as yesterday to being too weak to walk today. She did not witness the syncopal event today, but was told out of by neighbors. She notes that the patient lives at home, alone, has daytime help, but no nighttime assistance.  2:08 PM On repeat exam the patient is in similar condition. Remaining labs notable for leukocytosis, consistent with patient's ongoing infection, x-ray suggesting pneumonia. X-ray reviewed by myself, different from the most recent study in July, 2019. Patient will receive ceftriaxone, azithromycin P Given that the patient has pneumonia, worsening overall condition, and new episode of syncope, there is no current evidence for sustained arrhythmia, the patient will be admitted for further evaluation and management.   Final Clinical Impressions(s) / ED  Diagnoses  Syncope Community acquired pneumonia   Carmin Muskrat, MD 03/12/18 1408

## 2018-03-13 ENCOUNTER — Inpatient Hospital Stay (HOSPITAL_COMMUNITY): Payer: Medicare Other

## 2018-03-13 DIAGNOSIS — H9190 Unspecified hearing loss, unspecified ear: Secondary | ICD-10-CM

## 2018-03-13 LAB — CBC WITH DIFFERENTIAL/PLATELET
Abs Immature Granulocytes: 0.03 10*3/uL (ref 0.00–0.07)
Basophils Absolute: 0 10*3/uL (ref 0.0–0.1)
Basophils Relative: 0 %
Eosinophils Absolute: 0.1 10*3/uL (ref 0.0–0.5)
Eosinophils Relative: 1 %
HEMATOCRIT: 38.7 % (ref 36.0–46.0)
Hemoglobin: 12.4 g/dL (ref 12.0–15.0)
Immature Granulocytes: 0 %
Lymphocytes Relative: 15 %
Lymphs Abs: 1.4 10*3/uL (ref 0.7–4.0)
MCH: 32.1 pg (ref 26.0–34.0)
MCHC: 32 g/dL (ref 30.0–36.0)
MCV: 100.3 fL — ABNORMAL HIGH (ref 80.0–100.0)
Monocytes Absolute: 0.8 10*3/uL (ref 0.1–1.0)
Monocytes Relative: 8 %
Neutro Abs: 7.6 10*3/uL (ref 1.7–7.7)
Neutrophils Relative %: 76 %
Platelets: 197 10*3/uL (ref 150–400)
RBC: 3.86 MIL/uL — ABNORMAL LOW (ref 3.87–5.11)
RDW: 13.3 % (ref 11.5–15.5)
WBC: 9.9 10*3/uL (ref 4.0–10.5)
nRBC: 0 % (ref 0.0–0.2)

## 2018-03-13 LAB — BASIC METABOLIC PANEL
Anion gap: 5 (ref 5–15)
BUN: 21 mg/dL (ref 8–23)
CO2: 27 mmol/L (ref 22–32)
Calcium: 8.1 mg/dL — ABNORMAL LOW (ref 8.9–10.3)
Chloride: 110 mmol/L (ref 98–111)
Creatinine, Ser: 0.73 mg/dL (ref 0.44–1.00)
GFR calc Af Amer: >60 mL/min (ref >60)
GFR calc non Af Amer: >60 mL/min (ref >60)
Glucose, Bld: 105 mg/dL — ABNORMAL HIGH (ref 70–99)
Potassium: 3.3 mmol/L — ABNORMAL LOW (ref 3.5–5.1)
Sodium: 142 mmol/L (ref 135–145)

## 2018-03-13 LAB — MAGNESIUM: Magnesium: 1.8 mg/dL (ref 1.7–2.4)

## 2018-03-13 LAB — HIV ANTIBODY (ROUTINE TESTING W REFLEX): HIV Screen 4th Generation wRfx: NONREACTIVE

## 2018-03-13 LAB — STREP PNEUMONIAE URINARY ANTIGEN: Strep Pneumo Urinary Antigen: NEGATIVE

## 2018-03-13 MED ORDER — CALCIUM CARBONATE ANTACID 500 MG PO CHEW
1.0000 | CHEWABLE_TABLET | Freq: Every day | ORAL | Status: DC
  Administered 2018-03-13 – 2018-03-15 (×3): 200 mg via ORAL
  Filled 2018-03-13 (×3): qty 1

## 2018-03-13 MED ORDER — VITAMIN D 25 MCG (1000 UNIT) PO TABS
4000.0000 [IU] | ORAL_TABLET | Freq: Every day | ORAL | Status: DC
  Administered 2018-03-13 – 2018-03-15 (×3): 4000 [IU] via ORAL
  Filled 2018-03-13 (×3): qty 4

## 2018-03-13 MED ORDER — BUSPIRONE HCL 5 MG PO TABS
5.0000 mg | ORAL_TABLET | Freq: Two times a day (BID) | ORAL | Status: DC
  Administered 2018-03-13 – 2018-03-15 (×4): 5 mg via ORAL
  Filled 2018-03-13 (×4): qty 1

## 2018-03-13 MED ORDER — OLOPATADINE HCL 0.7 % OP SOLN
1.0000 [drp] | Freq: Every day | OPHTHALMIC | Status: DC
  Administered 2018-03-14: 1 [drp] via OPHTHALMIC
  Filled 2018-03-13 (×5): qty 1

## 2018-03-13 MED ORDER — CARVEDILOL 3.125 MG PO TABS
3.1250 mg | ORAL_TABLET | Freq: Two times a day (BID) | ORAL | Status: DC
  Administered 2018-03-13 – 2018-03-15 (×4): 3.125 mg via ORAL
  Filled 2018-03-13 (×4): qty 1

## 2018-03-13 MED ORDER — APIXABAN 2.5 MG PO TABS
2.5000 mg | ORAL_TABLET | Freq: Two times a day (BID) | ORAL | Status: DC
  Administered 2018-03-13 – 2018-03-15 (×4): 2.5 mg via ORAL
  Filled 2018-03-13 (×4): qty 1

## 2018-03-13 MED ORDER — POTASSIUM CHLORIDE 20 MEQ/15ML (10%) PO SOLN
40.0000 meq | Freq: Once | ORAL | Status: AC
  Administered 2018-03-13: 40 meq via ORAL
  Filled 2018-03-13: qty 30

## 2018-03-13 MED ORDER — POLYETHYLENE GLYCOL 3350 17 G PO PACK
17.0000 g | PACK | Freq: Every day | ORAL | Status: DC
  Administered 2018-03-13 – 2018-03-14 (×2): 17 g via ORAL
  Filled 2018-03-13 (×2): qty 1

## 2018-03-13 MED ORDER — ATORVASTATIN CALCIUM 40 MG PO TABS
80.0000 mg | ORAL_TABLET | Freq: Every day | ORAL | Status: DC
  Administered 2018-03-13 – 2018-03-15 (×3): 80 mg via ORAL
  Filled 2018-03-13 (×3): qty 2

## 2018-03-13 NOTE — Plan of Care (Signed)
  Problem: Acute Rehab PT Goals(only PT should resolve) Goal: Pt Will Go Supine/Side To Sit Outcome: Progressing Flowsheets (Taken 03/13/2018 1208) Pt will go Supine/Side to Sit: with supervision Goal: Patient Will Transfer Sit To/From Stand Outcome: Progressing Flowsheets (Taken 03/13/2018 1208) Patient will transfer sit to/from stand: with min guard assist Goal: Pt Will Transfer Bed To Chair/Chair To Bed Outcome: Progressing Flowsheets (Taken 03/13/2018 1208) Pt will Transfer Bed to Chair/Chair to Bed: min guard assist Goal: Pt Will Ambulate Outcome: Progressing Flowsheets (Taken 03/13/2018 1208) Pt will Ambulate: 75 feet; with rolling walker; with min guard assist   12:09 PM, 03/13/18 Lonell Grandchild, MPT Physical Therapist with [facility] [phone] office [phone] mobile phone

## 2018-03-13 NOTE — Progress Notes (Addendum)
PROGRESS NOTE    Marissa Burns  YBO:[id number]  DOB: 05-26-1914  DOA: 03/12/2018 PCP: Chipper Herb, MD   Brief Admission Hx: 84 year old female who had been living at home alone presented to the emergency department after a brief episode of loss of consciousness and was diagnosed with dehydration and community-acquired pneumonia.  MDM/Assessment & Plan:   1. Generalized weakness-likely secondary to the pneumonia and mild dehydration.  Treating pneumonia with IV antibiotic therapy.  I have requested blood cultures to be obtained.  The patient does not appear septic at this time.  Continue to follow.  PT recommending SNF.  TSH within normal limits.  Plan to de-escalate antibiotics 12/5. 2. CAP - continue antibiotics as ordered for today and plan to de-escalate antibiotics 12/5.  3. Syncope and collapse-patient had an episode where she slumped over for 5 minutes and was unarousable however she quickly recovered.  She did have an echocardiogram done in July of this year with a normal EF noted and grade 1 diastolic dysfunction.  We will obtain carotid Doppler studies.    Telemetry remained stable and will discontinue cardiac monitoring. 4. Chronic constipation- we will provide laxatives as needed. 5. At risk for falls-given advanced age and comorbidities, fall precautions have been requested.  PT recommending SNF placement. 6. Hard of hearing and visual impairment. 7. Mild dehydration- treated with gentle IV fluids overnight. 8. Hypokalemia-treated with oral liquid potassium solution. 9. Osteoarthritis- Tylenol as needed for symptoms. 10. Chronic atrial fibrillation-patient is fully anticoagulated with apixaban which we will continue in the hospital.  Rate is very well controlled.  Follow on telemetry. 11. Asymptomatic bacteriuria-does not need specific treatment at this time.   DVT Prophylaxis: Apixaban Code Status: DNR Family Communication: Sister updated at the  bedside Disposition Plan: Inpatient care required as noted above, PT eval for possible  Need for sNF placement.    Consultants:  Social work  Care management  Procedures:    Antimicrobials:  Ceftriaxone 12/3  Azithromycin 12/3   Subjective: The patient reports that she has lost her bottom dentures and is having trouble eating breakfast she otherwise feels much better today.  Objective: Vitals:   03/12/18 1617 03/12/18 2019 03/12/18 2210 03/13/18 0700  BP: (!) 144/84  109/71 110/60  Pulse: 81  83 75  Resp: 17  15 16   Temp:   97.9 F (36.6 C) 98 F (36.7 C)  TempSrc:   Oral Oral  SpO2: 98% 92% 96% 98%  Weight:        Intake/Output Summary (Last 24 hours) at 03/13/2018 1422 Last data filed at 03/13/2018 0310 Gross per 24 hour  Intake 1303.59 ml  Output -  Net 1303.59 ml   Filed Weights   03/12/18 1206  Weight: 59 kg     REVIEW OF SYSTEMS  As per history otherwise all reviewed and reported negative  Exam:  General exam: Elderly female very hard of hearing in no apparent distress and cooperative, edentulous Respiratory system: Mostly clear to auscultation. No increased work of breathing. Cardiovascular system: S1 & S2 heard, irregularly irregular.  No JVD, murmurs, gallops, clicks or pedal edema. Gastrointestinal system: Abdomen is nondistended, soft and nontender. Normal bowel sounds heard. Central nervous system: Alert and oriented. No focal neurological deficits. Extremities: no CCE.  Data Reviewed: Basic Metabolic Panel: Recent Labs  Lab 03/12/18 1230 03/13/18 0545  NA 140 142  K 3.6 3.3*  CL 103 110  CO2 28 27  GLUCOSE 131* 105*  BUN 17 21  CREATININE 0.75 0.73  CALCIUM 8.6* 8.1*  MG  --  1.8   Liver Function Tests: No results for input(s): AST, ALT, ALKPHOS, BILITOT, PROT, ALBUMIN in the last 168 hours. No results for input(s): LIPASE, AMYLASE in the last 168 hours. No results for input(s): AMMONIA in the last 168 hours. CBC: Recent  Labs  Lab 03/12/18 1230 03/13/18 0545  WBC 15.8* 9.9  NEUTROABS 13.1* 7.6  HGB 14.5 12.4  HCT 44.8 38.7  MCV 98.9 100.3*  PLT 229 197   Cardiac Enzymes: No results for input(s): CKTOTAL, CKMB, CKMBINDEX, TROPONINI in the last 168 hours. CBG (last 3)  No results for input(s): GLUCAP in the last 72 hours. Recent Results (from the past 240 hour(s))  Culture, blood (Routine X 2) w Reflex to ID Panel     Status: None (Preliminary result)   Collection Time: 03/12/18  2:43 PM  Result Value Ref Range Status   Specimen Description BLOOD LEFT HAND  Final   Special Requests   Final    BOTTLES DRAWN AEROBIC ONLY Blood Culture results may not be optimal due to an inadequate volume of blood received in culture bottles   Culture   Final    NO GROWTH < 24 HOURS Performed at [facility], [address], [place], [place] [address]    Report Status PENDING  Incomplete  Culture, blood (Routine X 2) w Reflex to ID Panel     Status: None (Preliminary result)   Collection Time: 03/12/18  4:33 PM  Result Value Ref Range Status   Specimen Description BLOOD RIGHT ANTECUBITAL  Final   Special Requests   Final    BOTTLES DRAWN AEROBIC AND ANAEROBIC Blood Culture adequate volume   Culture   Final    NO GROWTH < 24 HOURS Performed at [facility], [address] [place], [place] [address]    Report Status PENDING  Incomplete     Studies: Dg Chest 2 View  Result Date: 03/12/2018 CLINICAL DATA:  Recent syncopal episode EXAM: CHEST - 2 VIEW COMPARISON:  10/26/2017 FINDINGS: Cardiac shadow is again mildly enlarged. Aortic calcifications are again seen. Increasing right middle lobe and right lower lobe infiltrate are seen. Associated small effusion is noted. Small left pleural effusion is noted as well. IMPRESSION: Increasing right basilar infiltrate involving the middle and lower lobes with bilateral small effusions. Electronically Signed   By: Inez Catalina M.D.   On: 03/12/2018 13:34   Ct Head Wo  Contrast  Result Date: 03/12/2018 CLINICAL DATA:  84 year old female with altered mental status. Currently on Eliquis. EXAM: CT HEAD WITHOUT CONTRAST TECHNIQUE: Contiguous axial images were obtained from the base of the skull through the vertex without intravenous contrast. COMPARISON:  11/12/2017 and prior CTs FINDINGS: Brain: No evidence of acute infarction, hemorrhage, hydrocephalus, extra-axial collection or mass lesion/mass effect. Atrophy, chronic small-vessel white matter ischemic changes and remote LEFT cerebellar infarct again noted. Vascular: Vertebral and carotid atherosclerotic calcifications again noted. Skull: Normal. Negative for fracture or focal lesion. Sinuses/Orbits: No acute finding. Other: None. IMPRESSION: 1. No evidence of acute intracranial abnormality 2. Atrophy, chronic small-vessel white matter ischemic changes and remote LEFT cerebellar infarct. Electronically Signed   By: Margarette Canada M.D.   On: 03/12/2018 14:01     Scheduled Meds: . azithromycin  500 mg Oral Q24H   Continuous Infusions: . sodium chloride 40 mL/hr at 03/13/18 0310  . cefTRIAXone (ROCEPHIN)  IV 1 g (03/13/18 1418)    Active Problems:  Cataract   Arthritis   Chronic constipation   Primary osteoarthritis of both knees   Visual impairment   Weakness   Syncope and collapse   Leukocytosis   Dehydration   Hard of hearing   At high risk for falls   Time spent:   Irwin Brakeman, MD, FAAFP [facility] Pager [phone]  If 7PM-7AM, please contact night-coverage [email or web address] Password [email or web address] 03/13/2018, 2:22 PM    LOS: 1 day

## 2018-03-13 NOTE — Care Management (Signed)
CM in to see pt to discuss home vs SNF. Pt's primary caregiver Arville Go) at bedside and provides information. Pt does not have hearing aids. Pt lives alone, Arville Go is a neighbor who brings food, stays with patient on the weekends, manages medictions and accompanies pt to appointments. Pt has aid who is there 8 hrs 5 days a week. Her husband and two children are deceased. She has 2 grandchildren, one (greg) is involved and visits and checks on a few times a week. Pt's sister [name]) is POA but, per Mechele Claude, does not always have a good understanding of what's going on. Per Mechele Claude, no one is available to stay with patient at night. She will need to be placed. They have requested [facility], pt has been there before. Discussed with CSW.

## 2018-03-13 NOTE — NC FL2 (Signed)
Welcome LEVEL OF CARE SCREENING TOOL     IDENTIFICATION  Patient Name: Marissa Burns Birthdate: 14-Jan-1915 Sex: female Admission Date (Current Location): 03/12/2018  [place] and [place] Number:  Marissa Burns [id number] [place] and Address:  Marissa Burns [address] [place]      Provider Number: [id number]  Attending Physician Name and Address:  Murlean Iba, MD  Relative Name and Phone Number:       Current Level of Care: Hospital Recommended Level of Care: [place] Prior Approval Number:    Date Approved/Denied:   PASRR Number: [id number])  Discharge Plan: SNF    Current Diagnoses: Patient Active Problem List   Diagnosis Date Noted  . Syncope and collapse 03/12/2018  . Leukocytosis 03/12/2018  . Dehydration 03/12/2018  . Hard of hearing 03/12/2018  . At high risk for falls 03/12/2018  . Acute systolic CHF (congestive heart failure) ([place]) 10/29/2017  . Acute diastolic CHF (congestive heart failure) ([place]) 10/29/2017  . Urinary tract infection without hematuria   . Weakness 10/26/2017  . Acute CHF (congestive heart failure) ([place]) 10/26/2017  . Acute lower UTI 10/26/2017  . BPPV (benign paroxysmal positional vertigo) 12/10/2014  . Visual impairment 01/22/2014  . Chronic constipation 10/08/2013  . Primary osteoarthritis of both knees 10/08/2013  . Hearing deficit 05/20/2013  . Hypertension 10/30/2012  . Hyperlipemia 10/30/2012  . Vitamin D deficiency 10/30/2012  . Arthritis 10/30/2012  . Colon polyp   . Diverticulosis   . Osteoarthritis   . Edema   . Osteoporosis   . Elevated blood sugar   . Cataract     Orientation RESPIRATION BLADDER Height & Weight     Self  Normal Continent Weight: 130 lb (59 kg) Height:     BEHAVIORAL SYMPTOMS/MOOD NEUROLOGICAL BOWEL NUTRITION STATUS      Continent Diet(soft)  AMBULATORY STATUS COMMUNICATION OF NEEDS Skin   Limited Assist Verbally  Normal                       Personal Care Assistance Level of Assistance  Bathing, Feeding, Dressing Bathing Assistance: Limited assistance Feeding assistance: Independent Dressing Assistance: Limited assistance     Functional Limitations Info  Sight, Hearing, Speech Sight Info: Adequate Hearing Info: Impaired(very hard of hearing ) Speech Info: Adequate    SPECIAL CARE FACTORS FREQUENCY  PT (By licensed PT)     PT Frequency: 5x/week              Contractures Contractures Info: Not present    Additional Factors Info  Code Status, Allergies, Psychotropic Code Status Info: DNR Allergies Info: Alendronate Sodium, Celebrex, Dilacor, Evista, Propulsid Psychotropic Info: Buspar         Current Medications (03/13/2018):  This is the current hospital active medication list Current Facility-Administered Medications  Medication Dose Route Frequency Provider Last Rate Last Dose  . 0.9 %  sodium chloride infusion   Intravenous Continuous Johnson, Clanford L, MD 40 mL/hr at 03/13/18 0310    . azithromycin (ZITHROMAX) tablet 500 mg  500 mg Oral Q24H Johnson, Clanford L, MD      . cefTRIAXone (ROCEPHIN) 1 g in sodium chloride 0.9 % 100 mL IVPB  1 g Intravenous Q24H Johnson, Clanford L, MD         Discharge Medications: Please see discharge summary for a list of discharge medications.  Relevant Imaging Results:  Relevant Lab Results:   Additional Information SSN Marissa Burns  Marissa Burns Marissa Burns, Marissa Pugh, LCSW

## 2018-03-13 NOTE — Clinical Social Work Note (Addendum)
Clinical Social Work Assessment  Patient Details  Name: Marissa Burns MRN: [id number] Date of Birth: [birth year]  Date of referral:  03/13/18               Reason for consult:  Facility Placement                Permission sought to share information with:    Permission granted to share information::     Name::        Agency::     Relationship::     Contact Information:  Marissa Burns, neighbor  Housing/Transportation Living arrangements for the past 2 months:  Single Family Home Source of Information:  Patient, Friend/Neighbor Patient Interpreter Needed:  None Criminal Activity/Legal Involvement Pertinent to Current Situation/Hospitalization:  No - Comment as needed Significant Relationships:  Other Family Members, Other(Comment) Lives with:  Self Do you feel safe going back to the place where you live?  Yes Need for family participation in patient care:  Yes (Comment)  Care giving concerns:  Patient has an aid during the day for eight hours.  Her neighbor checks in on her and her grandson brings dinner some days and comes on Sunday.   Social Worker assessment / plan:  Patient would like to go home however caregivers feels that short term rehab is the best option and feel that patient may need to transition to long term care because she does not have anyone home with her during the night.   Employment status:  Retired Forensic scientist:  Medicare PT Recommendations:  [place] / Referral to community resources:  [place]  Patient/Family's Response to care:  Care givers are agreeable to short term rehab. Patient would prefer to go home.   Patient/Family's Understanding of and Emotional Response to Diagnosis, Current Treatment, and Prognosis:  Patient and family understand patient is in need of rehab and may need long term care due to her age and care needs. Family provided patient with SNF list from [facility] website. Copy of list placed on  patient's shadow chart.   Emotional Assessment Appearance:  Appears stated age Attitude/Demeanor/Rapport:    Affect (typically observed):  Accepting, Calm Orientation:  Oriented to Self Alcohol / Substance use:  Not Applicable Psych involvement (Current and /or in the community):  No (Comment)  Discharge Needs  Concerns to be addressed:  Discharge Planning Concerns Readmission within the last 30 days:  Yes Current discharge risk:  Lives alone Barriers to Discharge:  No Barriers Identified   Ihor Gully, LCSW 03/13/2018, 1:59 PM

## 2018-03-13 NOTE — Progress Notes (Signed)
Marissa Burns is resting in bed. Marissa Burns is set up for breakfast. Marissa Burns is very upset stating that she needs her bottom dentures to eat breakfast. There are no bottom dentures in the room and no documentation of them on arrival. Nurse tech, Margreta Journey states that Marissa Burns did not arrive with bottom dentures yesterday only top dentures. Marissa Burns is calm at the moment and is attempting to eat breakfast.

## 2018-03-13 NOTE — Clinical Social Work Placement (Signed)
   CLINICAL SOCIAL WORK PLACEMENT  NOTE  Date:  03/13/2018  Patient Details  Name: Marissa Burns MRN: [id number] Date of Birth: May 06, 1914  Clinical Social Work is seeking post-discharge placement for this patient at the [place] level of care (*CSW will initial, date and re-position this form in  chart as items are completed):  Yes   Patient/family provided with [place] Work Department's list of facilities offering this level of care within the geographic area requested by the patient (or if unable, by the patient's family).  Yes   Patient/family informed of their freedom to choose among providers that offer the needed level of care, that participate in Medicare, Medicaid or managed care program needed by the patient, have an available bed and are willing to accept the patient.  Yes   Patient/family informed of [place]'s ownership interest in [facility] and [facility], as well as of the fact that they are under no obligation to receive care at these facilities.  PASRR submitted to EDS on       PASRR number received on       Existing PASRR number confirmed on 03/13/18     FL2 transmitted to all facilities in geographic area requested by pt/family on 03/13/18     FL2 transmitted to all facilities within larger geographic area on       Patient informed that his/her managed care company has contracts with or will negotiate with certain facilities, including the following:            Patient/family informed of bed offers received.  Patient chooses bed at       Physician recommends and patient chooses bed at      Patient to be transferred to   on  .  Patient to be transferred to facility by       Patient family notified on   of transfer.  Name of family member notified:        PHYSICIAN       Additional Comment:    _______________________________________________ Ihor Gully, LCSW 03/13/2018, 1:51 PM

## 2018-03-13 NOTE — Evaluation (Signed)
Physical Therapy Evaluation Patient Details Name: Marissa Burns MRN: [id number] DOB: Aug 08, 1914 Today's Date: 03/13/2018   History of Present Illness   Marissa Burns is a 84 y.o. female with a past medical history of hypertension, diverticulosis, elevated blood sugar and hypercholesterolemia presented to the emergency department from home by EMS after having an episode where she slumped over in her chair and became difficult to arouse for several minutes.  This lasted approximately 5 minutes and the patient became awake and alert and oriented.  The family reported that the patient had been moving much slower over the past 3 to 4 days and also for the past week she has been having noticeable weakness in the right lower extremity and has been generally weaker than normal.  The patient had been walking with a walker up until about 1 day prior to arrival. The patient had been living at home alone and caring for herself according to her sister.  The patient's 2 sons have died and she has grandchildren that are alive.  Her 1 year old sister mostly care for her and checks on her.  The patient spent 2 months in [place] last year and has been at home ever since.  The patient had been vomiting most of yesterday and had said that she needed to see a physician.  The patient is very hard of hearing and mostly can only understand yelling.  She is also visually impaired.    Clinical Impression  Patient demonstrates slow labored movement for sitting up at bedside requiring assistance, able to ambulate in hallway without loss of balance, but once fatigued became a fall risk requiring Mod assistance to make it back to bedside due to BLE weakness.  Patient tolerated sitting up in chair after therapy.  Patient will benefit from continued physical therapy in hospital and recommended venue below to increase strength, balance, endurance for safe ADLs and gait.    Follow Up Recommendations SNF;Supervision for  mobility/OOB;Supervision - Intermittent    Equipment Recommendations  None recommended by PT    Recommendations for Other Services       Precautions / Restrictions Precautions Precautions: Fall Restrictions Weight Bearing Restrictions: No      Mobility  Bed Mobility Overal bed mobility: Needs Assistance Bed Mobility: Supine to Sit     Supine to sit: Min assist     General bed mobility comments: slow labored movement  Transfers Overall transfer level: Needs assistance Equipment used: Rolling walker (2 wheeled) Transfers: Sit to/from [facility] Sit to Stand: Min assist Stand pivot transfers: Min assist       General transfer comment: labored movement  Ambulation/Gait Ambulation/Gait assistance: Min assist Gait Distance (Feet): 50 Feet Assistive device: Rolling walker (2 wheeled) Gait Pattern/deviations: Decreased step length - right;Decreased step length - left;Decreased stride length Gait velocity: decreased   General Gait Details: slow labored cadence without loss of balance, once fatigued cadence slows even more and had diffiuclty making it back to room due to BLE weakness  Stairs            Wheelchair Mobility    Modified Rankin (Stroke Patients Only)       Balance Overall balance assessment: Needs assistance Sitting-balance support: Feet supported;No upper extremity supported Sitting balance-Leahy Scale: Good     Standing balance support: Bilateral upper extremity supported;During functional activity Standing balance-Leahy Scale: Fair Standing balance comment: using RW  Pertinent Vitals/Pain Pain Assessment: No/denies pain    Home Living Family/patient expects to be discharged to:: Private residence Living Arrangements: Alone Available Help at Discharge: Family;Personal care attendant;Available PRN/intermittently Type of Home: House Home Access: Ramped entrance     Home  Layout: One level Home Equipment: Walker - 2 wheels;Cane - single point;Bedside commode      Prior Function Level of Independence: Needs assistance   Gait / Transfers Assistance Needed: assisted for going up/down steps, Supervised household gait with RW  ADL's / Homemaking Assistance Needed: has home aides 2 hours in AM x  7 days/week, from 6-6:30 PM until patient put to bed in PM x  7 days/week        Hand Dominance        Extremity/Trunk Assessment   Upper Extremity Assessment Upper Extremity Assessment: Generalized weakness    Lower Extremity Assessment Lower Extremity Assessment: Generalized weakness    Cervical / Trunk Assessment Cervical / Trunk Assessment: Kyphotic  Communication   Communication: No difficulties  Cognition Arousal/Alertness: Awake/alert Behavior During Therapy: WFL for tasks assessed/performed Overall Cognitive Status: Within Functional Limits for tasks assessed                                        General Comments      Exercises     Assessment/Plan    PT Assessment Patient needs continued PT services  PT Problem List Decreased strength;Decreased activity tolerance;Decreased balance;Decreased mobility       PT Treatment Interventions Gait training;Stair training;Functional mobility training;Therapeutic exercise;Patient/family education    PT Goals (Current goals can be found in the Care Plan section)  Acute Rehab PT Goals Patient Stated Goal: return home PT Goal Formulation: With patient Time For Goal Achievement: 03/27/18 Potential to Achieve Goals: Good    Frequency Min 3X/week   Barriers to discharge        Co-evaluation               AM-PAC PT "6 Clicks" Mobility  Outcome Measure Help needed turning from your back to your side while in a flat bed without using bedrails?: A Little Help needed moving from lying on your back to sitting on the side of a flat bed without using bedrails?: Total Help  needed moving to and from a bed to a chair (including a wheelchair)?: Total Help needed standing up from a chair using your arms (e.g., wheelchair or bedside chair)?: A Little Help needed to walk in hospital room?: A Little Help needed climbing 3-5 steps with a railing? : A Lot 6 Click Score: 13    End of Session   Activity Tolerance: Patient tolerated treatment well;Patient limited by fatigue Patient left: in chair;with call bell/phone within reach;with chair alarm set Nurse Communication: Mobility status PT Visit Diagnosis: Unsteadiness on feet (R26.81);Other abnormalities of gait and mobility (R26.89);Muscle weakness (generalized) (M62.81)    Time: 5784-6962 PT Time Calculation (min) (ACUTE ONLY): 34 min   Charges:   PT Evaluation $PT Eval Moderate Complexity: 1 Mod PT Treatments $Therapeutic Activity: 23-37 mins        12:07 PM, 03/13/18 Lonell Grandchild, MPT Physical Therapist with [facility] 336 [phone] office [phone] mobile phone

## 2018-03-14 LAB — BASIC METABOLIC PANEL
Anion gap: 6 (ref 5–15)
BUN: 20 mg/dL (ref 8–23)
CO2: 24 mmol/L (ref 22–32)
Calcium: 8.1 mg/dL — ABNORMAL LOW (ref 8.9–10.3)
Chloride: 112 mmol/L — ABNORMAL HIGH (ref 98–111)
Creatinine, Ser: 0.62 mg/dL (ref 0.44–1.00)
GFR calc Af Amer: >60 mL/min (ref >60)
Glucose, Bld: 111 mg/dL — ABNORMAL HIGH (ref 70–99)
Potassium: 3.7 mmol/L (ref 3.5–5.1)
SODIUM: 142 mmol/L (ref 135–145)

## 2018-03-14 LAB — MAGNESIUM: Magnesium: 2 mg/dL (ref 1.7–2.4)

## 2018-03-14 NOTE — Progress Notes (Signed)
Physical Therapy Treatment Patient Details Name: Marissa Burns MRN: [id number] DOB: 1914/07/21 Today's Date: 03/14/2018    History of Present Illness  Marissa Burns is a 84 y.o. female with a past medical history of hypertension, diverticulosis, elevated blood sugar and hypercholesterolemia presented to the emergency department from home by EMS after having an episode where she slumped over in her chair and became difficult to arouse for several minutes.  This lasted approximately 5 minutes and the patient became awake and alert and oriented.  The family reported that the patient had been moving much slower over the past 3 to 4 days and also for the past week she has been having noticeable weakness in the right lower extremity and has been generally weaker than normal.  The patient had been walking with a walker up until about 1 day prior to arrival. The patient had been living at home alone and caring for herself according to her sister.  The patient's 2 sons have died and she has grandchildren that are alive.  Her 2 year old sister mostly care for her and checks on her.  The patient spent 2 months in [place] last year and has been at home ever since.  The patient had been vomiting most of yesterday and had said that she needed to see a physician.  The patient is very hard of hearing and mostly can only understand yelling.  She is also visually impaired.    PT Comments    Patient demonstrates slow labored movement for sitting up at bedside, a moderate/severe fall risk due to pushing RW to far in front once fatigued during ambulation, requires frequent verbal/tactile cueing to step closer to walker, but poor carryover once fatigued.  Patient tolerated sitting up in chair to eat breakfast after therapy - nursing staff notified.  Patient will benefit from continued physical therapy in hospital and recommended venue below to increase strength, balance, endurance for safe ADLs and gait.     Follow Up Recommendations  SNF;Supervision for mobility/OOB;Supervision - Intermittent     Equipment Recommendations  None recommended by PT    Recommendations for Other Services       Precautions / Restrictions Precautions Precautions: Fall Restrictions Weight Bearing Restrictions: No    Mobility  Bed Mobility Overal bed mobility: Needs Assistance Bed Mobility: Supine to Sit     Supine to sit: Min assist     General bed mobility comments: slow labored movement  Transfers Overall transfer level: Needs assistance Equipment used: Rolling walker (2 wheeled) Transfers: Sit to/from [facility] Sit to Stand: Min assist Stand pivot transfers: Min assist       General transfer comment: labored movement  Ambulation/Gait Ambulation/Gait assistance: Min assist Gait Distance (Feet): 40 Feet Assistive device: Rolling walker (2 wheeled) Gait Pattern/deviations: Decreased step length - right;Decreased step length - left;Decreased stride length Gait velocity: decreased   General Gait Details: slow labored cadence with tendency to push RW to far in front requiring verbal/tactile cueing to move closer to RW, had diffiuclty making turns, and once fatigued pushes RW even farther away from body   Stairs             Wheelchair Mobility    Modified Rankin (Stroke Patients Only)       Balance Overall balance assessment: Needs assistance Sitting-balance support: Feet supported;No upper extremity supported Sitting balance-Leahy Scale: Good     Standing balance support: During functional activity;Bilateral upper extremity supported Standing balance-Leahy Scale: [place] Standing  balance comment: using RW                            Cognition Arousal/Alertness: Awake/alert Behavior During Therapy: WFL for tasks assessed/performed Overall Cognitive Status: Within Functional Limits for tasks assessed                                         Exercises General Exercises - Lower Extremity Long Arc Quad: Seated;AROM;Strengthening;Both;10 reps Toe Raises: Seated;AROM;Strengthening;Both;10 reps Heel Raises: Seated;AROM;Strengthening;Both;10 reps    General Comments        Pertinent Vitals/Pain Pain Assessment: No/denies pain    Home Living                      Prior Function            PT Goals (current goals can now be found in the care plan section) Acute Rehab PT Goals Patient Stated Goal: return home PT Goal Formulation: With patient Time For Goal Achievement: 03/27/18 Potential to Achieve Goals: Good Progress towards PT goals: Progressing toward goals    Frequency    Min 3X/week      PT Plan Current plan remains appropriate    Co-evaluation              AM-PAC PT "6 Clicks" Mobility   Outcome Measure  Help needed turning from your back to your side while in a flat bed without using bedrails?: A Little Help needed moving from lying on your back to sitting on the side of a flat bed without using bedrails?: Total Help needed moving to and from a bed to a chair (including a wheelchair)?: Total Help needed standing up from a chair using your arms (e.g., wheelchair or bedside chair)?: A Little Help needed to walk in hospital room?: A Little Help needed climbing 3-5 steps with a railing? : A Lot 6 Click Score: 13    End of Session   Activity Tolerance: Patient tolerated treatment well;Patient limited by fatigue Patient left: in chair;with call bell/phone within reach;with chair alarm set Nurse Communication: Mobility status PT Visit Diagnosis: Unsteadiness on feet (R26.81);Other abnormalities of gait and mobility (R26.89);Muscle weakness (generalized) (M62.81)     Time: 5093-2671 PT Time Calculation (min) (ACUTE ONLY): 24 min  Charges:  $Gait Training: 8-22 mins $Therapeutic Exercise: 8-22 mins                     9:20 AM, 03/14/18 Lonell Grandchild, MPT Physical  Therapist with [facility] 336 [phone] office [phone] mobile phone

## 2018-03-15 DIAGNOSIS — R609 Edema, unspecified: Secondary | ICD-10-CM | POA: Diagnosis not present

## 2018-03-15 DIAGNOSIS — M6281 Muscle weakness (generalized): Secondary | ICD-10-CM | POA: Diagnosis not present

## 2018-03-15 DIAGNOSIS — I10 Essential (primary) hypertension: Secondary | ICD-10-CM | POA: Diagnosis not present

## 2018-03-15 DIAGNOSIS — E860 Dehydration: Secondary | ICD-10-CM | POA: Diagnosis not present

## 2018-03-15 DIAGNOSIS — M25551 Pain in right hip: Secondary | ICD-10-CM | POA: Diagnosis not present

## 2018-03-15 DIAGNOSIS — M1389 Other specified arthritis, multiple sites: Secondary | ICD-10-CM | POA: Diagnosis not present

## 2018-03-15 DIAGNOSIS — H9190 Unspecified hearing loss, unspecified ear: Secondary | ICD-10-CM | POA: Diagnosis not present

## 2018-03-15 DIAGNOSIS — R079 Chest pain, unspecified: Secondary | ICD-10-CM | POA: Diagnosis not present

## 2018-03-15 DIAGNOSIS — R600 Localized edema: Secondary | ICD-10-CM | POA: Diagnosis not present

## 2018-03-15 DIAGNOSIS — F419 Anxiety disorder, unspecified: Secondary | ICD-10-CM | POA: Diagnosis not present

## 2018-03-15 DIAGNOSIS — D72829 Elevated white blood cell count, unspecified: Secondary | ICD-10-CM | POA: Diagnosis not present

## 2018-03-15 DIAGNOSIS — E785 Hyperlipidemia, unspecified: Secondary | ICD-10-CM | POA: Diagnosis not present

## 2018-03-15 DIAGNOSIS — R8271 Bacteriuria: Secondary | ICD-10-CM | POA: Diagnosis not present

## 2018-03-15 DIAGNOSIS — R8281 Pyuria: Secondary | ICD-10-CM | POA: Diagnosis not present

## 2018-03-15 DIAGNOSIS — I4891 Unspecified atrial fibrillation: Secondary | ICD-10-CM | POA: Diagnosis not present

## 2018-03-15 DIAGNOSIS — W19XXXA Unspecified fall, initial encounter: Secondary | ICD-10-CM | POA: Diagnosis not present

## 2018-03-15 DIAGNOSIS — K5909 Other constipation: Secondary | ICD-10-CM | POA: Diagnosis not present

## 2018-03-15 DIAGNOSIS — Z9181 History of falling: Secondary | ICD-10-CM | POA: Diagnosis not present

## 2018-03-15 DIAGNOSIS — R531 Weakness: Secondary | ICD-10-CM | POA: Diagnosis not present

## 2018-03-15 DIAGNOSIS — E876 Hypokalemia: Secondary | ICD-10-CM | POA: Diagnosis not present

## 2018-03-15 DIAGNOSIS — H547 Unspecified visual loss: Secondary | ICD-10-CM | POA: Diagnosis not present

## 2018-03-15 DIAGNOSIS — R55 Syncope and collapse: Secondary | ICD-10-CM | POA: Diagnosis not present

## 2018-03-15 DIAGNOSIS — D2230 Melanocytic nevi of unspecified part of face: Secondary | ICD-10-CM | POA: Diagnosis not present

## 2018-03-15 DIAGNOSIS — Z7401 Bed confinement status: Secondary | ICD-10-CM | POA: Diagnosis not present

## 2018-03-15 DIAGNOSIS — H269 Unspecified cataract: Secondary | ICD-10-CM | POA: Diagnosis not present

## 2018-03-15 DIAGNOSIS — I501 Left ventricular failure: Secondary | ICD-10-CM | POA: Diagnosis not present

## 2018-03-15 DIAGNOSIS — E559 Vitamin D deficiency, unspecified: Secondary | ICD-10-CM | POA: Diagnosis not present

## 2018-03-15 DIAGNOSIS — R419 Unspecified symptoms and signs involving cognitive functions and awareness: Secondary | ICD-10-CM | POA: Diagnosis not present

## 2018-03-15 DIAGNOSIS — Z79899 Other long term (current) drug therapy: Secondary | ICD-10-CM | POA: Diagnosis not present

## 2018-03-15 DIAGNOSIS — K5900 Constipation, unspecified: Secondary | ICD-10-CM | POA: Diagnosis not present

## 2018-03-15 DIAGNOSIS — K5904 Chronic idiopathic constipation: Secondary | ICD-10-CM | POA: Diagnosis not present

## 2018-03-15 DIAGNOSIS — I482 Chronic atrial fibrillation, unspecified: Secondary | ICD-10-CM | POA: Diagnosis not present

## 2018-03-15 DIAGNOSIS — I6523 Occlusion and stenosis of bilateral carotid arteries: Secondary | ICD-10-CM | POA: Diagnosis not present

## 2018-03-15 DIAGNOSIS — N182 Chronic kidney disease, stage 2 (mild): Secondary | ICD-10-CM | POA: Diagnosis not present

## 2018-03-15 DIAGNOSIS — M25532 Pain in left wrist: Secondary | ICD-10-CM | POA: Diagnosis not present

## 2018-03-15 DIAGNOSIS — M170 Bilateral primary osteoarthritis of knee: Secondary | ICD-10-CM | POA: Diagnosis not present

## 2018-03-15 MED ORDER — AMLODIPINE BESYLATE 5 MG PO TABS: 5.0000 mg | ORAL_TABLET | Freq: Every day | ORAL | Status: DC

## 2018-03-15 MED ORDER — FUROSEMIDE 20 MG PO TABS: 10.0000 mg | ORAL_TABLET | ORAL | 0 refills | Status: AC

## 2018-03-15 NOTE — Care Management Important Message (Signed)
Important Message  Patient Details  Name: Marissa Burns MRN: [id number] Date of Birth: [birth year]   Medicare Important Message Given:  Yes    Sherald Barge, RN 03/15/2018, 12:24 PM

## 2018-03-15 NOTE — Discharge Summary (Signed)
Physician Discharge Summary  Marissa Burns WIO:[id number] DOB: 1914/08/14 DOA: 03/12/2018  PCP: Chipper Herb, MD  Admit date: 03/12/2018 Discharge date: 03/15/2018  Admitted From: Home  Disposition: SNF   Recommendations for Follow-up:  1. If patient has any more falls or fall risk doesn't improve would discontinue apixaban permanently   Discharge Condition: STABLE   CODE STATUS: DNR    Brief Hospitalization Summary: Please see all hospital notes, images, labs for full details of the hospitalization. HPI: Marissa Burns is a 84 y.o. female with a past medical history of hypertension, diverticulosis, elevated blood sugar and hypercholesterolemia presented to the emergency department from home by EMS after having an episode where she slumped over in her chair and became difficult to arouse for several minutes.  This lasted approximately 5 minutes and the patient became awake and alert and oriented.  The family reported that the patient had been moving much slower over the past 3 to 4 days and also for the past week she has been having noticeable weakness in the right lower extremity and has been generally weaker than normal.  The patient had been walking with a walker up until about 1 day prior to arrival. The patient had been living at home alone and caring for herself according to her sister.  The patient's 2 sons have died and she has grandchildren that are alive.  Her 50 year old sister mostly care for her and checks on her.  The patient spent 2 months in [place] last year and has been at home ever since.  The patient had been vomiting most of yesterday and had said that she needed to see a physician.  The patient is very hard of hearing and mostly can only understand yelling.  She is also visually impaired.    ED course: The patient was evaluated in the ED and noted to be afebrile.  Her blood pressure was elevated at 161/89.  Her pulse ox was 96% on room air.  The patient had some  labs that revealed that she had a leukocytosis with a white blood cell count of 15.  The patient was noted to have 11-20 white blood cells in the urine.  The patient had a chest x-ray that was positive for right middle and lower lobe infiltrates.  The patient is being admitted for further evaluation and treatment.  Brief Admission Hx: 84 year old female who had been living at home alone presented to the emergency department after a brief episode of loss of consciousness and was diagnosed with dehydration and community-acquired pneumonia.  MDM/Assessment & Plan:   1. Generalized weakness-likely secondary to the pneumonia and mild dehydration. Treated pneumonia with IV antibiotic therapy. The patient appears much better and back to baseline. Continue to follow. PT recommending SNF. TSH within normal limits.   2. CAP - completed antibiotics.  Pt feeling better.  3. Syncope and collapse-patient had an episode where she slumped over for 5 minutes and was unarousable however she quickly recovered. She did have an echocardiogram done in July of this year with a normal EF noted and grade 1 diastolic dysfunction. We will obtain carotid Doppler studies.   Telemetry remained stable.  4. Chronic constipation- continue laxatives as needed. 5. At risk for falls-given advanced age and comorbidities, fall precautions have been requested. PT recommending SNF placement.  If patient continues to be high fall risk would discontinue apixaban permanently.  6. Hard of hearing and visual impairment. 7. Mild dehydration- treated with gentle  IV fluids overnight. 8. Hypokalemia-treated with oral liquid potassium solution. 9. Osteoarthritis- Tylenol as needed for symptoms. 10. Chronic atrial fibrillation-patient is fully anticoagulated with apixaban which we will continue in the hospital. Rate is very well controlled. Follow on telemetry. 11. Asymptomatic bacteriuria-does not need specific treatment at this time.    DVT Prophylaxis:Apixaban Code Status:DNR Family Communication:Sister updated at the bedside Disposition Plan:Inpatient care required as noted above, PT eval for possible Need for sNF placement.    Consultants:  Social work  Care management  Procedures:    Antimicrobials:  Ceftriaxone 12/3 - 12/6  Azithromycin 12/3 - 12/6    Discharge Diagnoses:  Active Problems:   Cataract   Arthritis   Chronic constipation   Primary osteoarthritis of both knees   Visual impairment   Weakness   Syncope and collapse   Leukocytosis   Dehydration   Hard of hearing   At high risk for falls    Discharge Instructions: Discharge Instructions    Call MD for:  difficulty breathing, headache or visual disturbances   Complete by:  As directed    Call MD for:  persistant dizziness or light-headedness   Complete by:  As directed    Call MD for:  persistant nausea and vomiting   Complete by:  As directed    Increase activity slowly   Complete by:  As directed      Allergies as of 03/15/2018      Reactions   Alendronate Sodium Other (See Comments)   unknown   Celebrex [celecoxib] Other (See Comments)   unknown   Dilacor [diltiazem Hcl] Other (See Comments)   unknown   Evista [raloxifene Hydrochloride] Other (See Comments)   unknown   Propulsid [cisapride] Other (See Comments)   unknown      Medication List    TAKE these medications   acetaminophen 500 MG tablet Commonly known as:  TYLENOL Take 1,000 mg by mouth daily as needed for moderate pain.   amLODipine 5 MG tablet Commonly known as:  NORVASC TAKE 1 TABLET (5 MG TOTAL) BY MOUTH DAILY.   apixaban 2.5 MG Tabs tablet Commonly known as:  ELIQUIS Take 1 tablet (2.5 mg total) by mouth 2 (two) times daily.   atorvastatin 80 MG tablet Commonly known as:  LIPITOR TAKE 1 TABLET (80 MG TOTAL) BY MOUTH DAILY.   busPIRone 5 MG tablet Commonly known as:  BUSPAR TAKE ONE TABLET BY MOUTH TWICE DAILY FOR  ANXIETY What changed:  See the new instructions.   calcium carbonate 500 MG chewable tablet Commonly known as:  TUMS - dosed in mg elemental calcium Chew 1 tablet by mouth daily.   carvedilol 3.125 MG tablet Commonly known as:  COREG Take 1 tablet (3.125 mg total) by mouth 2 (two) times daily with a meal.   furosemide 20 MG tablet Commonly known as:  LASIX Take 0.5 tablets (10 mg total) by mouth every other day. Start taking on:  03/18/2018 What changed:    how much to take  when to take this  These instructions start on 03/18/2018. If you are unsure what to do until then, ask your doctor or other care provider.   lisinopril 5 MG tablet Commonly known as:  PRINIVIL,ZESTRIL Take 1 tablet (5 mg total) by mouth daily.   Menthol (Topical Analgesic) 4 % Gel Apply topically BID to effected area.   PAZEO 0.7 % Soln Generic drug:  Olopatadine HCl Place 1 drop into both eyes daily.   polyethylene glycol  packet Commonly known as:  MIRALAX / GLYCOLAX Take 17 g by mouth daily.   Vitamin D3 50 MCG (2000 UT) Tabs Take 2 tablets by mouth daily after breakfast.      Contact information for after-discharge care    Destination    [facility] .   Service:  Skilled Nursing Contact information: [place] [phone]             Allergies  Allergen Reactions  . Alendronate Sodium Other (See Comments)    unknown  . Celebrex [Celecoxib] Other (See Comments)    unknown  . Dilacor [Diltiazem Hcl] Other (See Comments)    unknown  . Evista [Raloxifene Hydrochloride] Other (See Comments)    unknown  . Propulsid [Cisapride] Other (See Comments)    unknown   Allergies as of 03/15/2018      Reactions   Alendronate Sodium Other (See Comments)   unknown   Celebrex [celecoxib] Other (See Comments)   unknown   Dilacor [diltiazem Hcl] Other (See Comments)   unknown   Evista [raloxifene Hydrochloride] Other (See Comments)    unknown   Propulsid [cisapride] Other (See Comments)   unknown      Medication List    TAKE these medications   acetaminophen 500 MG tablet Commonly known as:  TYLENOL Take 1,000 mg by mouth daily as needed for moderate pain.   amLODipine 5 MG tablet Commonly known as:  NORVASC TAKE 1 TABLET (5 MG TOTAL) BY MOUTH DAILY.   apixaban 2.5 MG Tabs tablet Commonly known as:  ELIQUIS Take 1 tablet (2.5 mg total) by mouth 2 (two) times daily.   atorvastatin 80 MG tablet Commonly known as:  LIPITOR TAKE 1 TABLET (80 MG TOTAL) BY MOUTH DAILY.   busPIRone 5 MG tablet Commonly known as:  BUSPAR TAKE ONE TABLET BY MOUTH TWICE DAILY FOR ANXIETY What changed:  See the new instructions.   calcium carbonate 500 MG chewable tablet Commonly known as:  TUMS - dosed in mg elemental calcium Chew 1 tablet by mouth daily.   carvedilol 3.125 MG tablet Commonly known as:  COREG Take 1 tablet (3.125 mg total) by mouth 2 (two) times daily with a meal.   furosemide 20 MG tablet Commonly known as:  LASIX Take 0.5 tablets (10 mg total) by mouth every other day. Start taking on:  03/18/2018 What changed:    how much to take  when to take this  These instructions start on 03/18/2018. If you are unsure what to do until then, ask your doctor or other care provider.   lisinopril 5 MG tablet Commonly known as:  PRINIVIL,ZESTRIL Take 1 tablet (5 mg total) by mouth daily.   Menthol (Topical Analgesic) 4 % Gel Apply topically BID to effected area.   PAZEO 0.7 % Soln Generic drug:  Olopatadine HCl Place 1 drop into both eyes daily.   polyethylene glycol packet Commonly known as:  MIRALAX / GLYCOLAX Take 17 g by mouth daily.   Vitamin D3 50 MCG (2000 UT) Tabs Take 2 tablets by mouth daily after breakfast.       Procedures/Studies: Dg Chest 2 View  Result Date: 03/12/2018 CLINICAL DATA:  Recent syncopal episode EXAM: CHEST - 2 VIEW COMPARISON:  10/26/2017 FINDINGS: Cardiac shadow is again  mildly enlarged. Aortic calcifications are again seen. Increasing right middle lobe and right lower lobe infiltrate are seen. Associated small effusion is noted. Small left pleural effusion is noted as well.  IMPRESSION: Increasing right basilar infiltrate involving the middle and lower lobes with bilateral small effusions. Electronically Signed   By: Inez Catalina M.D.   On: 03/12/2018 13:34   Ct Head Wo Contrast  Result Date: 03/12/2018 CLINICAL DATA:  84 year old female with altered mental status. Currently on Eliquis. EXAM: CT HEAD WITHOUT CONTRAST TECHNIQUE: Contiguous axial images were obtained from the base of the skull through the vertex without intravenous contrast. COMPARISON:  11/12/2017 and prior CTs FINDINGS: Brain: No evidence of acute infarction, hemorrhage, hydrocephalus, extra-axial collection or mass lesion/mass effect. Atrophy, chronic small-vessel white matter ischemic changes and remote LEFT cerebellar infarct again noted. Vascular: Vertebral and carotid atherosclerotic calcifications again noted. Skull: Normal. Negative for fracture or focal lesion. Sinuses/Orbits: No acute finding. Other: None. IMPRESSION: 1. No evidence of acute intracranial abnormality 2. Atrophy, chronic small-vessel white matter ischemic changes and remote LEFT cerebellar infarct. Electronically Signed   By: Margarette Canada M.D.   On: 03/12/2018 14:01   US Carotid Bilateral  Result Date: 03/13/2018 CLINICAL DATA:  Syncope, collapse EXAM: BILATERAL CAROTID DUPLEX ULTRASOUND TECHNIQUE: Pearline Cables scale imaging, color Doppler and duplex ultrasound were performed of bilateral carotid and vertebral arteries in the neck. COMPARISON:  None. FINDINGS: Criteria: Quantification of carotid stenosis is based on velocity parameters that correlate the residual internal carotid diameter with NASCET-based stenosis levels, using the diameter of the distal internal carotid lumen as the denominator for stenosis measurement. The following  velocity measurements were obtained: RIGHT ICA: 73/19 cm/sec CCA: 29/52 cm/sec SYSTOLIC ICA/CCA RATIO:  1.1 ECA: 83 cm/sec LEFT ICA: 66/10 cm/sec CCA: 84/13 cm/sec SYSTOLIC ICA/CCA RATIO:  1.3 ECA: 108 cm/sec RIGHT CAROTID ARTERY: Mild calcified plaque formation. Despite this, no hemodynamically significant right ICA stenosis, velocity elevation, or turbulent flow. Degree of narrowing estimated less than 50% by ultrasound criteria. RIGHT VERTEBRAL ARTERY:  Antegrade LEFT CAROTID ARTERY: Mild heterogeneous left carotid bifurcation atherosclerosis. Despite this no hemodynamically significant left ICA stenosis, velocity elevation, or turbulent flow. Degree of narrowing estimated less than 50% by ultrasound criteria. LEFT VERTEBRAL ARTERY:  Antegrade IMPRESSION: Mild bilateral carotid atherosclerosis. No hemodynamically significant ICA stenosis. Degree of narrowing less than 50% bilaterally by ultrasound criteria. Patent antegrade vertebral flow bilaterally Electronically Signed   By: Jerilynn Mages.  Shick M.D.   On: 03/13/2018 15:53      Subjective: Pt is awake, alert, no distress, says she feels better today.  No SOB and no CP.   Discharge Exam: Vitals:   03/14/18 2141 03/15/18 0548  BP: 140/82 (!) 181/97  Pulse: 82 84  Resp:    Temp: 98.3 F (36.8 C) 97.6 F (36.4 C)  SpO2: 95% 96%   Vitals:   03/14/18 1400 03/14/18 1958 03/14/18 2141 03/15/18 0548  BP: 136/83  140/82 (!) 181/97  Pulse: 80  82 84  Resp: 18     Temp: 98.1 F (36.7 C)  98.3 F (36.8 C) 97.6 F (36.4 C)  TempSrc:   Oral Oral  SpO2: 98%  95% 96%  Weight:      Height:  5\' 7"  (1.702 m)     General exam: Elderly female very hard of hearing in no apparent distress and cooperative, edentulous Respiratory system: Mostly clear to auscultation. No increased work of breathing. Cardiovascular system: S1 & S2 heard, irregularly irregular.  No JVD, murmurs, gallops, clicks or pedal edema. Gastrointestinal system: Abdomen is nondistended, soft  and nontender. Normal bowel sounds heard. Central nervous system: Alert and oriented. No focal neurological deficits. Extremities: no  CCE.   The results of significant diagnostics from this hospitalization (including imaging, microbiology, ancillary and laboratory) are listed below for reference.     Microbiology: Recent Results (from the past 240 hour(s))  Culture, blood (Routine X 2) w Reflex to ID Panel     Status: None (Preliminary result)   Collection Time: 03/12/18  2:43 PM  Result Value Ref Range Status   Specimen Description BLOOD LEFT HAND  Final   Special Requests   Final    BOTTLES DRAWN AEROBIC ONLY Blood Culture results may not be optimal due to an inadequate volume of blood received in culture bottles   Culture   Final    NO GROWTH 3 DAYS Performed at [facility], [address] [place], [place] [address]    Report Status PENDING  Incomplete  Culture, blood (Routine X 2) w Reflex to ID Panel     Status: None (Preliminary result)   Collection Time: 03/12/18  4:33 PM  Result Value Ref Range Status   Specimen Description BLOOD RIGHT ANTECUBITAL  Final   Special Requests   Final    BOTTLES DRAWN AEROBIC AND ANAEROBIC Blood Culture adequate volume   Culture   Final    NO GROWTH 3 DAYS Performed at [facility], [address] [place], [place] [address]    Report Status PENDING  Incomplete     Labs: BNP (last 3 results) Recent Labs    10/26/17 1319  BNP 756.4*   Basic Metabolic Panel: Recent Labs  Lab 03/12/18 1230 03/13/18 0545 03/14/18 0506  NA 140 142 142  K 3.6 3.3* 3.7  CL 103 110 112*  CO2 28 27 24   GLUCOSE 131* 105* 111*  BUN 17 21 20   CREATININE 0.75 0.73 0.62  CALCIUM 8.6* 8.1* 8.1*  MG  --  1.8 2.0   Liver Function Tests: No results for input(s): AST, ALT, ALKPHOS, BILITOT, PROT, ALBUMIN in the last 168 hours. No results for input(s): LIPASE, AMYLASE in the last 168 hours. No results for input(s): AMMONIA in the last 168  hours. CBC: Recent Labs  Lab 03/12/18 1230 03/13/18 0545  WBC 15.8* 9.9  NEUTROABS 13.1* 7.6  HGB 14.5 12.4  HCT 44.8 38.7  MCV 98.9 100.3*  PLT 229 197   Cardiac Enzymes: No results for input(s): CKTOTAL, CKMB, CKMBINDEX, TROPONINI in the last 168 hours. BNP: Invalid input(s): POCBNP CBG: No results for input(s): GLUCAP in the last 168 hours. D-Dimer No results for input(s): DDIMER in the last 72 hours. Hgb A1c No results for input(s): HGBA1C in the last 72 hours. Lipid Profile No results for input(s): CHOL, HDL, LDLCALC, TRIG, CHOLHDL, LDLDIRECT in the last 72 hours. Thyroid function studies Recent Labs    03/12/18 1633  TSH 1.441   Anemia work up No results for input(s): VITAMINB12, FOLATE, FERRITIN, TIBC, IRON, RETICCTPCT in the last 72 hours. Urinalysis    Component Value Date/Time   COLORURINE YELLOW 03/12/2018 1231   APPEARANCEUR HAZY (A) 03/12/2018 1231   LABSPEC 1.018 03/12/2018 1231   PHURINE 5.0 03/12/2018 1231   GLUCOSEU NEGATIVE 03/12/2018 1231   HGBUR NEGATIVE 03/12/2018 1231   BILIRUBINUR NEGATIVE 03/12/2018 1231   BILIRUBINUR neg 06/04/2014 1011   KETONESUR NEGATIVE 03/12/2018 1231   PROTEINUR NEGATIVE 03/12/2018 1231   UROBILINOGEN 0.2 12/07/2014 1322   NITRITE POSITIVE (A) 03/12/2018 1231   LEUKOCYTESUR NEGATIVE 03/12/2018 1231   Sepsis Labs Invalid input(s): PROCALCITONIN,  WBC,  LACTICIDVEN Microbiology Recent Results (from the past 240 hour(s))  Culture, blood (Routine X 2) w Reflex to ID Panel     Status: None (Preliminary result)   Collection Time: 03/12/18  2:43 PM  Result Value Ref Range Status   Specimen Description BLOOD LEFT HAND  Final   Special Requests   Final    BOTTLES DRAWN AEROBIC ONLY Blood Culture results may not be optimal due to an inadequate volume of blood received in culture bottles   Culture   Final    NO GROWTH 3 DAYS Performed at [facility], [address] [place], [place] [address]    Report Status PENDING   Incomplete  Culture, blood (Routine X 2) w Reflex to ID Panel     Status: None (Preliminary result)   Collection Time: 03/12/18  4:33 PM  Result Value Ref Range Status   Specimen Description BLOOD RIGHT ANTECUBITAL  Final   Special Requests   Final    BOTTLES DRAWN AEROBIC AND ANAEROBIC Blood Culture adequate volume   Culture   Final    NO GROWTH 3 DAYS Performed at [facility], [address], [place], [place] [address]    Report Status PENDING  Incomplete   Time coordinating discharge: 34 minutes  SIGNED:  Irwin Brakeman, MD  [facility] 03/15/2018, 12:50 PM Pager [phone]  If 7PM-7AM, please contact night-coverage [email or web address] Password [email or web address]

## 2018-03-15 NOTE — Clinical Social Work Placement (Addendum)
   CLINICAL SOCIAL WORK PLACEMENT  NOTE  Date:  03/15/2018  Patient Details  Name: Marissa Burns MRN: [id number] Date of Birth: Aug 18, 1914  Clinical Social Work is seeking post-discharge placement for this patient at the [place] level of care (*CSW will initial, date and re-position this form in  chart as items are completed):  Yes   Patient/family provided with [facility] Work Department's list of facilities offering this level of care within the geographic area requested by the patient (or if unable, by the patient's family).  Yes   Patient/family informed of their freedom to choose among providers that offer the needed level of care, that participate in Medicare, Medicaid or managed care program needed by the patient, have an available bed and are willing to accept the patient.  Yes   Patient/family informed of [facility]'s ownership interest in [facility] and [facility], as well as of the fact that they are under no obligation to receive care at these facilities.  PASRR submitted to EDS on       PASRR number received on       Existing PASRR number confirmed on 03/13/18     FL2 transmitted to all facilities in geographic area requested by pt/family on 03/13/18     FL2 transmitted to all facilities within larger geographic area on       Patient informed that his/her managed care company has contracts with or will negotiate with certain facilities, including the following:        Yes   Patient/family informed of bed offers received.  Patient chooses bed at [facility]     Physician recommends and patient chooses bed at      Patient to be transferred to [facility] on 03/15/18.  Patient to be transferred to facility by [facility]     Patient family notified on 03/15/18 of transfer.  Name of family member notified:  sister/POA, [name] and grandson [name]     PHYSICIAN       Additional Comment:  Discharge clinicals sent.  LCSW signing off.   _______________________________________________ Ambrose Pancoast D, LCSW 03/15/2018, 1:10 PM

## 2018-03-15 NOTE — Progress Notes (Signed)
PROGRESS NOTE    Marissa Burns  QIW:[id number]  DOB: [birth year]  DOA: 03/12/2018 PCP: Chipper Herb, MD   Brief Admission Hx: 84 year old female who had been living at home alone presented to the emergency department after a brief episode of loss of consciousness and was diagnosed with dehydration and community-acquired pneumonia.  MDM/Assessment & Plan:   1. Generalized weakness-likely secondary to the pneumonia and mild dehydration.  Treating pneumonia with IV antibiotic therapy.  I have requested blood cultures to be obtained.  The patient does not appear septic at this time.  Continue to follow.  PT recommending SNF.  TSH within normal limits.  Plan to de-escalate antibiotics. 2. CAP - continue antibiotics as ordered for today and plan to de-escalate antibiotics 12/5.  3. Syncope and collapse-patient had an episode where she slumped over for 5 minutes and was unarousable however she quickly recovered.  She did have an echocardiogram done in July of this year with a normal EF noted and grade 1 diastolic dysfunction.  We will obtain carotid Doppler studies.    Telemetry remained stable and will discontinue cardiac monitoring. 4. Chronic constipation- we will provide laxatives as needed. 5. At risk for falls-given advanced age and comorbidities, fall precautions have been requested.  PT recommending SNF placement. 6. Hard of hearing and visual impairment. 7. Mild dehydration- treated with gentle IV fluids overnight. 8. Hypokalemia-treated with oral liquid potassium solution. 9. Osteoarthritis- Tylenol as needed for symptoms. 10. Chronic atrial fibrillation-patient is fully anticoagulated with apixaban which we will continue in the hospital.  Rate is very well controlled.  Follow on telemetry. 11. Asymptomatic bacteriuria-does not need specific treatment at this time.   DVT Prophylaxis: Apixaban Code Status: DNR Family Communication: Sister updated at the bedside Disposition  Plan: Inpatient care required as noted above, PT eval for possible  Need for sNF placement.    Consultants:  Social work  Care management  Procedures:    Antimicrobials:  Ceftriaxone 12/3  Azithromycin 12/3   Subjective: Pt says that she is feeling a bit better today.    Objective: Vitals:   03/14/18 1400 03/14/18 1958 03/14/18 2141 03/15/18 0548  BP: 136/83  140/82 (!) 181/97  Pulse: 80  82 84  Resp: 18     Temp: 98.1 F (36.7 C)  98.3 F (36.8 C) 97.6 F (36.4 C)  TempSrc:   Oral Oral  SpO2: 98%  95% 96%  Weight:      Height:  5\' 7"  (1.702 m)      Intake/Output Summary (Last 24 hours) at 03/15/2018 1258 Last data filed at 03/15/2018 4174 Gross per 24 hour  Intake 820 ml  Output 800 ml  Net 20 ml   Filed Weights   03/12/18 1206  Weight: 59 kg     REVIEW OF SYSTEMS  As per history otherwise all reviewed and reported negative  Exam:  General exam: Elderly female very hard of hearing in no apparent distress and cooperative, edentulous Respiratory system: Mostly clear to auscultation. No increased work of breathing. Cardiovascular system: S1 & S2 heard, irregularly irregular.  No JVD, murmurs, gallops, clicks or pedal edema. Gastrointestinal system: Abdomen is nondistended, soft and nontender. Normal bowel sounds heard. Central nervous system: Alert and oriented. No focal neurological deficits. Extremities: no CCE.  Data Reviewed: Basic Metabolic Panel: Recent Labs  Lab 03/12/18 1230 03/13/18 0545 03/14/18 0506  NA 140 142 142  K 3.6 3.3* 3.7  CL 103 110 112*  CO2 28 27 24  GLUCOSE 131* 105* 111*  BUN 17 21 20   CREATININE 0.75 0.73 0.62  CALCIUM 8.6* 8.1* 8.1*  MG  --  1.8 2.0   Liver Function Tests: No results for input(s): AST, ALT, ALKPHOS, BILITOT, PROT, ALBUMIN in the last 168 hours. No results for input(s): LIPASE, AMYLASE in the last 168 hours. No results for input(s): AMMONIA in the last 168 hours. CBC: Recent Labs  Lab  03/12/18 1230 03/13/18 0545  WBC 15.8* 9.9  NEUTROABS 13.1* 7.6  HGB 14.5 12.4  HCT 44.8 38.7  MCV 98.9 100.3*  PLT 229 197   Cardiac Enzymes: No results for input(s): CKTOTAL, CKMB, CKMBINDEX, TROPONINI in the last 168 hours. CBG (last 3)  No results for input(s): GLUCAP in the last 72 hours. Recent Results (from the past 240 hour(s))  Culture, blood (Routine X 2) w Reflex to ID Panel     Status: None (Preliminary result)   Collection Time: 03/12/18  2:43 PM  Result Value Ref Range Status   Specimen Description BLOOD LEFT HAND  Final   Special Requests   Final    BOTTLES DRAWN AEROBIC ONLY Blood Culture results may not be optimal due to an inadequate volume of blood received in culture bottles   Culture   Final    NO GROWTH 3 DAYS Performed at [facility], [address] [place], [place] [address]    Report Status PENDING  Incomplete  Culture, blood (Routine X 2) w Reflex to ID Panel     Status: None (Preliminary result)   Collection Time: 03/12/18  4:33 PM  Result Value Ref Range Status   Specimen Description BLOOD RIGHT ANTECUBITAL  Final   Special Requests   Final    BOTTLES DRAWN AEROBIC AND ANAEROBIC Blood Culture adequate volume   Culture   Final    NO GROWTH 3 DAYS Performed at [facility], [address], [place], [place] [address]    Report Status PENDING  Incomplete     Studies: US Carotid Bilateral  Result Date: 03/13/2018 CLINICAL DATA:  Syncope, collapse EXAM: BILATERAL CAROTID DUPLEX ULTRASOUND TECHNIQUE: Pearline Cables scale imaging, color Doppler and duplex ultrasound were performed of bilateral carotid and vertebral arteries in the neck. COMPARISON:  None. FINDINGS: Criteria: Quantification of carotid stenosis is based on velocity parameters that correlate the residual internal carotid diameter with NASCET-based stenosis levels, using the diameter of the distal internal carotid lumen as the denominator for stenosis measurement. The following velocity measurements  were obtained: RIGHT ICA: 73/19 cm/sec CCA: 32/20 cm/sec SYSTOLIC ICA/CCA RATIO:  1.1 ECA: 83 cm/sec LEFT ICA: 66/10 cm/sec CCA: 25/42 cm/sec SYSTOLIC ICA/CCA RATIO:  1.3 ECA: 108 cm/sec RIGHT CAROTID ARTERY: Mild calcified plaque formation. Despite this, no hemodynamically significant right ICA stenosis, velocity elevation, or turbulent flow. Degree of narrowing estimated less than 50% by ultrasound criteria. RIGHT VERTEBRAL ARTERY:  Antegrade LEFT CAROTID ARTERY: Mild heterogeneous left carotid bifurcation atherosclerosis. Despite this no hemodynamically significant left ICA stenosis, velocity elevation, or turbulent flow. Degree of narrowing estimated less than 50% by ultrasound criteria. LEFT VERTEBRAL ARTERY:  Antegrade IMPRESSION: Mild bilateral carotid atherosclerosis. No hemodynamically significant ICA stenosis. Degree of narrowing less than 50% bilaterally by ultrasound criteria. Patent antegrade vertebral flow bilaterally Electronically Signed   By: Jerilynn Mages.  Shick M.D.   On: 03/13/2018 15:53   Scheduled Meds: . amLODipine  5 mg Oral Daily  . apixaban  2.5 mg Oral BID  . atorvastatin  80 mg Oral Daily  . azithromycin  500  mg Oral Q24H  . busPIRone  5 mg Oral BID  . calcium carbonate  1 tablet Oral Daily  . carvedilol  3.125 mg Oral BID WC  . cholecalciferol  4,000 Units Oral QPC breakfast  . Olopatadine HCl  1 drop Both Eyes Daily  . polyethylene glycol  17 g Oral Daily   Continuous Infusions: . cefTRIAXone (ROCEPHIN)  IV 1 g (03/14/18 1348)    Active Problems:   Cataract   Arthritis   Chronic constipation   Primary osteoarthritis of both knees   Visual impairment   Weakness   Syncope and collapse   Leukocytosis   Dehydration   Hard of hearing   At high risk for falls   Time spent:   Irwin Brakeman, MD, FAAFP [facility] Pager [phone]  If 7PM-7AM, please contact night-coverage [email or web address] Password [email or web address] 03/15/2018, 12:58 PM    LOS: 3 days

## 2018-03-15 NOTE — Progress Notes (Signed)
Patient states that she wants to go back home. Spoke to grandson that states they have started paperwork with her medicare case worker for 24 hour aids at home. Informed him that I would like our Education officer, museum and the doctor know. Dr. Wynetta Emery notified this am of family's wishes.

## 2018-03-17 LAB — CULTURE, BLOOD (ROUTINE X 2)
Culture: NO GROWTH
Culture: NO GROWTH
Special Requests: ADEQUATE

## 2018-03-18 ENCOUNTER — Other Ambulatory Visit: Payer: Self-pay | Admitting: Family Medicine

## 2018-03-18 DIAGNOSIS — I4891 Unspecified atrial fibrillation: Secondary | ICD-10-CM | POA: Diagnosis not present

## 2018-03-18 DIAGNOSIS — E785 Hyperlipidemia, unspecified: Secondary | ICD-10-CM | POA: Diagnosis not present

## 2018-03-18 DIAGNOSIS — N182 Chronic kidney disease, stage 2 (mild): Secondary | ICD-10-CM | POA: Diagnosis not present

## 2018-03-18 DIAGNOSIS — I501 Left ventricular failure: Secondary | ICD-10-CM | POA: Diagnosis not present

## 2018-03-19 ENCOUNTER — Other Ambulatory Visit: Payer: Self-pay | Admitting: Family Medicine

## 2018-03-19 NOTE — Telephone Encounter (Signed)
OV 04/19/18

## 2018-04-02 DIAGNOSIS — I4891 Unspecified atrial fibrillation: Secondary | ICD-10-CM | POA: Diagnosis not present

## 2018-04-02 DIAGNOSIS — E860 Dehydration: Secondary | ICD-10-CM | POA: Diagnosis not present

## 2018-04-02 DIAGNOSIS — N182 Chronic kidney disease, stage 2 (mild): Secondary | ICD-10-CM | POA: Diagnosis not present

## 2018-04-02 DIAGNOSIS — R609 Edema, unspecified: Secondary | ICD-10-CM | POA: Diagnosis not present

## 2018-04-08 DIAGNOSIS — K5904 Chronic idiopathic constipation: Secondary | ICD-10-CM | POA: Diagnosis not present

## 2018-04-08 DIAGNOSIS — E785 Hyperlipidemia, unspecified: Secondary | ICD-10-CM | POA: Diagnosis not present

## 2018-04-08 DIAGNOSIS — F419 Anxiety disorder, unspecified: Secondary | ICD-10-CM | POA: Diagnosis not present

## 2018-04-08 DIAGNOSIS — I10 Essential (primary) hypertension: Secondary | ICD-10-CM | POA: Diagnosis not present

## 2018-04-19 ENCOUNTER — Ambulatory Visit: Payer: Medicare Other | Admitting: Family Medicine

## 2018-05-10 DIAGNOSIS — K5904 Chronic idiopathic constipation: Secondary | ICD-10-CM | POA: Diagnosis not present

## 2018-05-10 DIAGNOSIS — R419 Unspecified symptoms and signs involving cognitive functions and awareness: Secondary | ICD-10-CM | POA: Diagnosis not present

## 2018-05-10 DIAGNOSIS — E559 Vitamin D deficiency, unspecified: Secondary | ICD-10-CM | POA: Diagnosis not present

## 2018-05-10 DIAGNOSIS — M1389 Other specified arthritis, multiple sites: Secondary | ICD-10-CM | POA: Diagnosis not present

## 2018-05-12 DIAGNOSIS — I4891 Unspecified atrial fibrillation: Secondary | ICD-10-CM | POA: Diagnosis not present

## 2018-05-12 DIAGNOSIS — R531 Weakness: Secondary | ICD-10-CM | POA: Diagnosis not present

## 2018-05-12 DIAGNOSIS — W19XXXA Unspecified fall, initial encounter: Secondary | ICD-10-CM | POA: Diagnosis not present

## 2018-05-12 DIAGNOSIS — I10 Essential (primary) hypertension: Secondary | ICD-10-CM | POA: Diagnosis not present

## 2018-05-25 DIAGNOSIS — D2230 Melanocytic nevi of unspecified part of face: Secondary | ICD-10-CM | POA: Diagnosis not present

## 2018-05-25 DIAGNOSIS — E785 Hyperlipidemia, unspecified: Secondary | ICD-10-CM | POA: Diagnosis not present

## 2018-05-25 DIAGNOSIS — R531 Weakness: Secondary | ICD-10-CM | POA: Diagnosis not present

## 2018-05-25 DIAGNOSIS — I4891 Unspecified atrial fibrillation: Secondary | ICD-10-CM | POA: Diagnosis not present

## 2018-06-10 DIAGNOSIS — E559 Vitamin D deficiency, unspecified: Secondary | ICD-10-CM | POA: Diagnosis not present

## 2018-06-10 DIAGNOSIS — M1389 Other specified arthritis, multiple sites: Secondary | ICD-10-CM | POA: Diagnosis not present

## 2018-06-10 DIAGNOSIS — K5904 Chronic idiopathic constipation: Secondary | ICD-10-CM | POA: Diagnosis not present

## 2018-06-10 DIAGNOSIS — F419 Anxiety disorder, unspecified: Secondary | ICD-10-CM | POA: Diagnosis not present

## 2018-07-04 DIAGNOSIS — F0390 Unspecified dementia without behavioral disturbance: Secondary | ICD-10-CM | POA: Diagnosis not present

## 2018-07-05 DIAGNOSIS — F0390 Unspecified dementia without behavioral disturbance: Secondary | ICD-10-CM | POA: Diagnosis not present

## 2018-07-06 DIAGNOSIS — F0390 Unspecified dementia without behavioral disturbance: Secondary | ICD-10-CM | POA: Diagnosis not present

## 2018-07-07 DIAGNOSIS — F0390 Unspecified dementia without behavioral disturbance: Secondary | ICD-10-CM | POA: Diagnosis not present

## 2018-07-08 DIAGNOSIS — F0390 Unspecified dementia without behavioral disturbance: Secondary | ICD-10-CM | POA: Diagnosis not present

## 2018-07-09 DIAGNOSIS — F0390 Unspecified dementia without behavioral disturbance: Secondary | ICD-10-CM | POA: Diagnosis not present

## 2018-07-10 DIAGNOSIS — F0390 Unspecified dementia without behavioral disturbance: Secondary | ICD-10-CM | POA: Diagnosis not present

## 2018-07-11 DIAGNOSIS — F0390 Unspecified dementia without behavioral disturbance: Secondary | ICD-10-CM | POA: Diagnosis not present

## 2018-07-14 DIAGNOSIS — F0390 Unspecified dementia without behavioral disturbance: Secondary | ICD-10-CM | POA: Diagnosis not present

## 2018-07-15 DIAGNOSIS — F0390 Unspecified dementia without behavioral disturbance: Secondary | ICD-10-CM | POA: Diagnosis not present

## 2018-07-17 DIAGNOSIS — F0390 Unspecified dementia without behavioral disturbance: Secondary | ICD-10-CM | POA: Diagnosis not present

## 2018-07-18 DIAGNOSIS — F0390 Unspecified dementia without behavioral disturbance: Secondary | ICD-10-CM | POA: Diagnosis not present

## 2018-07-19 DIAGNOSIS — F0390 Unspecified dementia without behavioral disturbance: Secondary | ICD-10-CM | POA: Diagnosis not present

## 2018-07-22 DIAGNOSIS — F0390 Unspecified dementia without behavioral disturbance: Secondary | ICD-10-CM | POA: Diagnosis not present

## 2018-07-23 DIAGNOSIS — F0390 Unspecified dementia without behavioral disturbance: Secondary | ICD-10-CM | POA: Diagnosis not present

## 2018-07-24 DIAGNOSIS — F0390 Unspecified dementia without behavioral disturbance: Secondary | ICD-10-CM | POA: Diagnosis not present

## 2018-07-25 DIAGNOSIS — F0390 Unspecified dementia without behavioral disturbance: Secondary | ICD-10-CM | POA: Diagnosis not present

## 2018-07-28 DIAGNOSIS — F0390 Unspecified dementia without behavioral disturbance: Secondary | ICD-10-CM | POA: Diagnosis not present

## 2018-07-30 DIAGNOSIS — F0390 Unspecified dementia without behavioral disturbance: Secondary | ICD-10-CM | POA: Diagnosis not present

## 2018-07-31 DIAGNOSIS — F0390 Unspecified dementia without behavioral disturbance: Secondary | ICD-10-CM | POA: Diagnosis not present

## 2018-08-02 DIAGNOSIS — R55 Syncope and collapse: Secondary | ICD-10-CM | POA: Diagnosis not present

## 2018-08-02 DIAGNOSIS — M6281 Muscle weakness (generalized): Secondary | ICD-10-CM | POA: Diagnosis not present

## 2018-08-05 DIAGNOSIS — M6281 Muscle weakness (generalized): Secondary | ICD-10-CM | POA: Diagnosis not present

## 2018-08-05 DIAGNOSIS — R55 Syncope and collapse: Secondary | ICD-10-CM | POA: Diagnosis not present

## 2018-08-06 DIAGNOSIS — R55 Syncope and collapse: Secondary | ICD-10-CM | POA: Diagnosis not present

## 2018-08-06 DIAGNOSIS — M6281 Muscle weakness (generalized): Secondary | ICD-10-CM | POA: Diagnosis not present

## 2018-08-07 DIAGNOSIS — R55 Syncope and collapse: Secondary | ICD-10-CM | POA: Diagnosis not present

## 2018-08-07 DIAGNOSIS — M6281 Muscle weakness (generalized): Secondary | ICD-10-CM | POA: Diagnosis not present

## 2018-08-08 DIAGNOSIS — M6281 Muscle weakness (generalized): Secondary | ICD-10-CM | POA: Diagnosis not present

## 2018-08-08 DIAGNOSIS — R55 Syncope and collapse: Secondary | ICD-10-CM | POA: Diagnosis not present

## 2018-08-13 DIAGNOSIS — M1389 Other specified arthritis, multiple sites: Secondary | ICD-10-CM | POA: Diagnosis not present

## 2018-08-13 DIAGNOSIS — I501 Left ventricular failure: Secondary | ICD-10-CM | POA: Diagnosis not present

## 2018-08-13 DIAGNOSIS — N182 Chronic kidney disease, stage 2 (mild): Secondary | ICD-10-CM | POA: Diagnosis not present

## 2018-08-13 DIAGNOSIS — K5904 Chronic idiopathic constipation: Secondary | ICD-10-CM | POA: Diagnosis not present

## 2018-08-19 DIAGNOSIS — Z79899 Other long term (current) drug therapy: Secondary | ICD-10-CM | POA: Diagnosis not present

## 2018-08-19 DIAGNOSIS — N189 Chronic kidney disease, unspecified: Secondary | ICD-10-CM | POA: Diagnosis not present

## 2018-08-19 DIAGNOSIS — D649 Anemia, unspecified: Secondary | ICD-10-CM | POA: Diagnosis not present

## 2018-09-23 DIAGNOSIS — N390 Urinary tract infection, site not specified: Secondary | ICD-10-CM | POA: Diagnosis not present

## 2018-10-16 DIAGNOSIS — D518 Other vitamin B12 deficiency anemias: Secondary | ICD-10-CM | POA: Diagnosis not present

## 2018-11-15 DIAGNOSIS — Z1383 Encounter for screening for respiratory disorder NEC: Secondary | ICD-10-CM | POA: Diagnosis not present

## 2018-11-15 DIAGNOSIS — Z20828 Contact with and (suspected) exposure to other viral communicable diseases: Secondary | ICD-10-CM | POA: Diagnosis not present

## 2018-12-10 DIAGNOSIS — Z79899 Other long term (current) drug therapy: Secondary | ICD-10-CM | POA: Diagnosis not present

## 2018-12-10 DIAGNOSIS — D649 Anemia, unspecified: Secondary | ICD-10-CM | POA: Diagnosis not present

## 2019-01-09 DIAGNOSIS — D6852 Prothrombin gene mutation: Secondary | ICD-10-CM | POA: Diagnosis not present

## 2019-01-09 DIAGNOSIS — I10 Essential (primary) hypertension: Secondary | ICD-10-CM | POA: Diagnosis not present

## 2019-01-30 DIAGNOSIS — N390 Urinary tract infection, site not specified: Secondary | ICD-10-CM | POA: Diagnosis not present

## 2019-02-13 DIAGNOSIS — E782 Mixed hyperlipidemia: Secondary | ICD-10-CM | POA: Diagnosis not present

## 2019-02-13 DIAGNOSIS — N189 Chronic kidney disease, unspecified: Secondary | ICD-10-CM | POA: Diagnosis not present

## 2019-02-26 ENCOUNTER — Inpatient Hospital Stay (HOSPITAL_COMMUNITY)
Admission: EM | Admit: 2019-02-26 | Discharge: 2019-03-01 | DRG: 193 | Disposition: A | Discharge status: Skilled Nursing Facility | Payer: Medicare Other | Source: Skilled Nursing Facility | Attending: Internal Medicine | Admitting: Internal Medicine

## 2019-02-26 ENCOUNTER — Emergency Department (HOSPITAL_COMMUNITY): Payer: Medicare Other

## 2019-02-26 ENCOUNTER — Other Ambulatory Visit: Payer: Self-pay

## 2019-02-26 ENCOUNTER — Encounter (HOSPITAL_COMMUNITY): Payer: Self-pay

## 2019-02-26 DIAGNOSIS — E7800 Pure hypercholesterolemia, unspecified: Secondary | ICD-10-CM

## 2019-02-26 DIAGNOSIS — R4182 Altered mental status, unspecified: Secondary | ICD-10-CM | POA: Diagnosis present

## 2019-02-26 DIAGNOSIS — M810 Age-related osteoporosis without current pathological fracture: Secondary | ICD-10-CM | POA: Diagnosis present

## 2019-02-26 DIAGNOSIS — I482 Chronic atrial fibrillation, unspecified: Secondary | ICD-10-CM | POA: Diagnosis present

## 2019-02-26 DIAGNOSIS — H9190 Unspecified hearing loss, unspecified ear: Secondary | ICD-10-CM | POA: Diagnosis present

## 2019-02-26 DIAGNOSIS — D72829 Elevated white blood cell count, unspecified: Secondary | ICD-10-CM

## 2019-02-26 DIAGNOSIS — U071 COVID-19: Secondary | ICD-10-CM | POA: Diagnosis present

## 2019-02-26 DIAGNOSIS — J9601 Acute respiratory failure with hypoxia: Secondary | ICD-10-CM | POA: Diagnosis present

## 2019-02-26 DIAGNOSIS — Z7901 Long term (current) use of anticoagulants: Secondary | ICD-10-CM | POA: Diagnosis not present

## 2019-02-26 DIAGNOSIS — Z8701 Personal history of pneumonia (recurrent): Secondary | ICD-10-CM | POA: Diagnosis not present

## 2019-02-26 DIAGNOSIS — M170 Bilateral primary osteoarthritis of knee: Secondary | ICD-10-CM | POA: Diagnosis present

## 2019-02-26 DIAGNOSIS — F419 Anxiety disorder, unspecified: Secondary | ICD-10-CM | POA: Diagnosis present

## 2019-02-26 DIAGNOSIS — K5909 Other constipation: Secondary | ICD-10-CM | POA: Diagnosis present

## 2019-02-26 DIAGNOSIS — I739 Peripheral vascular disease, unspecified: Secondary | ICD-10-CM | POA: Diagnosis present

## 2019-02-26 DIAGNOSIS — Z9049 Acquired absence of other specified parts of digestive tract: Secondary | ICD-10-CM | POA: Diagnosis not present

## 2019-02-26 DIAGNOSIS — Z886 Allergy status to analgesic agent status: Secondary | ICD-10-CM | POA: Diagnosis not present

## 2019-02-26 DIAGNOSIS — E785 Hyperlipidemia, unspecified: Secondary | ICD-10-CM | POA: Diagnosis present

## 2019-02-26 DIAGNOSIS — Y95 Nosocomial condition: Secondary | ICD-10-CM | POA: Diagnosis present

## 2019-02-26 DIAGNOSIS — G9341 Metabolic encephalopathy: Secondary | ICD-10-CM

## 2019-02-26 DIAGNOSIS — F0390 Unspecified dementia without behavioral disturbance: Secondary | ICD-10-CM | POA: Diagnosis present

## 2019-02-26 DIAGNOSIS — J189 Pneumonia, unspecified organism: Principal | ICD-10-CM

## 2019-02-26 DIAGNOSIS — R55 Syncope and collapse: Secondary | ICD-10-CM

## 2019-02-26 DIAGNOSIS — Z8719 Personal history of other diseases of the digestive system: Secondary | ICD-10-CM | POA: Diagnosis not present

## 2019-02-26 DIAGNOSIS — R404 Transient alteration of awareness: Secondary | ICD-10-CM | POA: Diagnosis not present

## 2019-02-26 DIAGNOSIS — Z79899 Other long term (current) drug therapy: Secondary | ICD-10-CM | POA: Diagnosis not present

## 2019-02-26 DIAGNOSIS — Z66 Do not resuscitate: Secondary | ICD-10-CM | POA: Diagnosis present

## 2019-02-26 DIAGNOSIS — I959 Hypotension, unspecified: Secondary | ICD-10-CM | POA: Diagnosis not present

## 2019-02-26 DIAGNOSIS — J9600 Acute respiratory failure, unspecified whether with hypoxia or hypercapnia: Secondary | ICD-10-CM

## 2019-02-26 DIAGNOSIS — Z888 Allergy status to other drugs, medicaments and biological substances status: Secondary | ICD-10-CM

## 2019-02-26 DIAGNOSIS — E860 Dehydration: Secondary | ICD-10-CM | POA: Diagnosis present

## 2019-02-26 DIAGNOSIS — I10 Essential (primary) hypertension: Secondary | ICD-10-CM | POA: Diagnosis present

## 2019-02-26 DIAGNOSIS — I4891 Unspecified atrial fibrillation: Secondary | ICD-10-CM | POA: Diagnosis not present

## 2019-02-26 DIAGNOSIS — I4581 Long QT syndrome: Secondary | ICD-10-CM | POA: Diagnosis present

## 2019-02-26 DIAGNOSIS — R4020 Unspecified coma: Secondary | ICD-10-CM | POA: Diagnosis not present

## 2019-02-26 DIAGNOSIS — Z9089 Acquired absence of other organs: Secondary | ICD-10-CM

## 2019-02-26 DIAGNOSIS — E782 Mixed hyperlipidemia: Secondary | ICD-10-CM | POA: Diagnosis not present

## 2019-02-26 DIAGNOSIS — R112 Nausea with vomiting, unspecified: Secondary | ICD-10-CM

## 2019-02-26 DIAGNOSIS — R0902 Hypoxemia: Secondary | ICD-10-CM | POA: Diagnosis not present

## 2019-02-26 HISTORY — DX: Vitamin D deficiency, unspecified: E55.9

## 2019-02-26 HISTORY — DX: Disorder of kidney and ureter, unspecified: N28.9

## 2019-02-26 HISTORY — DX: Peripheral vascular disease, unspecified: I73.9

## 2019-02-26 HISTORY — DX: Secondary hyperaldosteronism: E26.1

## 2019-02-26 HISTORY — DX: Anxiety disorder, unspecified: F41.9

## 2019-02-26 HISTORY — DX: Other primary thrombophilia: D68.59

## 2019-02-26 HISTORY — DX: Unspecified dementia, unspecified severity, without behavioral disturbance, psychotic disturbance, mood disturbance, and anxiety: F03.90

## 2019-02-26 HISTORY — DX: Hyperlipidemia, unspecified: E78.5

## 2019-02-26 LAB — BASIC METABOLIC PANEL
Anion gap: 10 (ref 5–15)
BUN: 22 mg/dL (ref 8–23)
CO2: 25 mmol/L (ref 22–32)
Calcium: 8.2 mg/dL — ABNORMAL LOW (ref 8.9–10.3)
Chloride: 106 mmol/L (ref 98–111)
Creatinine, Ser: 0.64 mg/dL (ref 0.44–1.00)
GFR calc Af Amer: >60 mL/min (ref >60)
GFR calc non Af Amer: >60 mL/min (ref >60)
Glucose, Bld: 122 mg/dL — ABNORMAL HIGH (ref 70–99)
Potassium: 4.3 mmol/L (ref 3.5–5.1)
Sodium: 141 mmol/L (ref 135–145)

## 2019-02-26 LAB — URINALYSIS, ROUTINE W REFLEX MICROSCOPIC
Bilirubin Urine: NEGATIVE
Glucose, UA: NEGATIVE mg/dL
Hgb urine dipstick: NEGATIVE
Ketones, ur: NEGATIVE mg/dL
Leukocytes,Ua: NEGATIVE
Nitrite: NEGATIVE
Protein, ur: NEGATIVE mg/dL
Specific Gravity, Urine: 1.024 (ref 1.005–1.030)
pH: 5 (ref 5.0–8.0)

## 2019-02-26 LAB — CBC WITH DIFFERENTIAL/PLATELET
Abs Immature Granulocytes: 0.05 10*3/uL (ref 0.00–0.07)
Basophils Absolute: 0 10*3/uL (ref 0.0–0.1)
Basophils Relative: 0 %
Eosinophils Absolute: 0 10*3/uL (ref 0.0–0.5)
Eosinophils Relative: 0 %
HCT: 47.8 % — ABNORMAL HIGH (ref 36.0–46.0)
Hemoglobin: 15 g/dL (ref 12.0–15.0)
Immature Granulocytes: 0 %
Lymphocytes Relative: 6 %
Lymphs Abs: 0.7 10*3/uL (ref 0.7–4.0)
MCH: 32.5 pg (ref 26.0–34.0)
MCHC: 31.4 g/dL (ref 30.0–36.0)
MCV: 103.5 fL — ABNORMAL HIGH (ref 80.0–100.0)
Monocytes Absolute: 0.5 10*3/uL (ref 0.1–1.0)
Monocytes Relative: 4 %
Neutro Abs: 11.2 10*3/uL — ABNORMAL HIGH (ref 1.7–7.7)
Neutrophils Relative %: 90 %
Platelets: 274 10*3/uL (ref 150–400)
RBC: 4.62 MIL/uL (ref 3.87–5.11)
RDW: 15.9 % — ABNORMAL HIGH (ref 11.5–15.5)
WBC: 12.5 10*3/uL — ABNORMAL HIGH (ref 4.0–10.5)
nRBC: 0 % (ref 0.0–0.2)

## 2019-02-26 LAB — MRSA PCR SCREENING: MRSA by PCR: NEGATIVE

## 2019-02-26 LAB — MAGNESIUM: Magnesium: 2.1 mg/dL (ref 1.7–2.4)

## 2019-02-26 LAB — LACTIC ACID, PLASMA: Lactic Acid, Venous: 1.5 mmol/L (ref 0.5–1.9)

## 2019-02-26 MED ORDER — VANCOMYCIN HCL IN DEXTROSE 1-5 GM/200ML-% IV SOLN: 1000.0000 mg | INTRAVENOUS | Status: DC

## 2019-02-26 MED ORDER — SODIUM CHLORIDE 0.9 % IV SOLN
Freq: Once | INTRAVENOUS | Status: AC
  Administered 2019-02-26: 12:00:00 via INTRAVENOUS

## 2019-02-26 MED ORDER — ACETAMINOPHEN 650 MG RE SUPP: 650.0000 mg | Freq: Four times a day (QID) | RECTAL | Status: DC | PRN

## 2019-02-26 MED ORDER — ATORVASTATIN CALCIUM 20 MG PO TABS
20.0000 mg | ORAL_TABLET | Freq: Every day | ORAL | Status: DC
  Administered 2019-02-27 – 2019-03-01 (×3): 20 mg via ORAL
  Filled 2019-02-26 (×3): qty 1

## 2019-02-26 MED ORDER — SODIUM CHLORIDE 0.9 % IV SOLN
INTRAVENOUS | Status: DC | PRN
  Administered 2019-02-27: 500 mL via INTRAVENOUS

## 2019-02-26 MED ORDER — SODIUM CHLORIDE 0.9 % IV SOLN
2.0000 g | Freq: Once | INTRAVENOUS | Status: AC
  Administered 2019-02-26: 2 g via INTRAVENOUS
  Filled 2019-02-26: qty 2

## 2019-02-26 MED ORDER — SODIUM CHLORIDE 0.9 % IV SOLN
2.0000 g | Freq: Two times a day (BID) | INTRAVENOUS | Status: DC
  Administered 2019-02-27: 2 g via INTRAVENOUS
  Filled 2019-02-26: qty 2

## 2019-02-26 MED ORDER — LORAZEPAM 2 MG/ML IJ SOLN
0.5000 mg | Freq: Once | INTRAMUSCULAR | Status: AC
  Administered 2019-02-26: 0.5 mg via INTRAVENOUS
  Filled 2019-02-26: qty 1

## 2019-02-26 MED ORDER — POLYETHYLENE GLYCOL 3350 17 G PO PACK
17.0000 g | PACK | Freq: Every day | ORAL | Status: DC
  Administered 2019-02-27 – 2019-03-01 (×2): 17 g via ORAL
  Filled 2019-02-26 (×2): qty 1

## 2019-02-26 MED ORDER — ENSURE ENLIVE PO LIQD
237.0000 mL | Freq: Two times a day (BID) | ORAL | Status: DC
  Administered 2019-02-27 – 2019-03-01 (×6): 237 mL via ORAL

## 2019-02-26 MED ORDER — CHLORHEXIDINE GLUCONATE 0.12 % MT SOLN
15.0000 mL | Freq: Two times a day (BID) | OROMUCOSAL | Status: DC
  Administered 2019-02-27 – 2019-03-01 (×5): 15 mL via OROMUCOSAL
  Filled 2019-02-26 (×4): qty 15

## 2019-02-26 MED ORDER — APIXABAN 2.5 MG PO TABS
2.5000 mg | ORAL_TABLET | Freq: Two times a day (BID) | ORAL | Status: DC
  Administered 2019-02-26 – 2019-03-01 (×6): 2.5 mg via ORAL
  Filled 2019-02-26 (×6): qty 1

## 2019-02-26 MED ORDER — CALCIUM CARBONATE ANTACID 500 MG PO CHEW
1.0000 | CHEWABLE_TABLET | Freq: Every day | ORAL | Status: DC
  Administered 2019-02-27 – 2019-03-01 (×2): 200 mg via ORAL
  Filled 2019-02-26 (×2): qty 1

## 2019-02-26 MED ORDER — ACETAMINOPHEN 325 MG PO TABS
650.0000 mg | ORAL_TABLET | Freq: Four times a day (QID) | ORAL | Status: DC | PRN
  Administered 2019-03-01 (×3): 650 mg via ORAL
  Filled 2019-02-26 (×3): qty 2

## 2019-02-26 MED ORDER — CARVEDILOL 3.125 MG PO TABS
3.1250 mg | ORAL_TABLET | Freq: Two times a day (BID) | ORAL | Status: DC
  Administered 2019-02-26 – 2019-03-01 (×6): 3.125 mg via ORAL
  Filled 2019-02-26 (×6): qty 1

## 2019-02-26 MED ORDER — SODIUM CHLORIDE 0.9% FLUSH
3.0000 mL | Freq: Two times a day (BID) | INTRAVENOUS | Status: DC
  Administered 2019-02-26 – 2019-02-28 (×3): 3 mL via INTRAVENOUS

## 2019-02-26 MED ORDER — ORAL CARE MOUTH RINSE
15.0000 mL | Freq: Two times a day (BID) | OROMUCOSAL | Status: DC
  Administered 2019-02-27 – 2019-03-01 (×5): 15 mL via OROMUCOSAL

## 2019-02-26 MED ORDER — VANCOMYCIN HCL 1.25 G IV SOLR
1250.0000 mg | Freq: Once | INTRAVENOUS | Status: AC
  Administered 2019-02-26: 1250 mg via INTRAVENOUS
  Filled 2019-02-26: qty 1250

## 2019-02-26 NOTE — ED Provider Notes (Signed)
Care assumed from Dr. Lacinda Axon.  Patient from nursing home with loss of consciousness and episode of unresponsiveness.  Improved after having bowel movement here.  EKG is nondiagnostic due to her screaming and uncooperation.   Chest x-ray is concerning for infiltrate.  Will start broad-spectrum antibiotics and plan admission for pneumonia as well as possible syncope  Discussed with patient's grandson Marya Amsler who agrees.  Admission discussed with Dr. Velia Meyer.   EKG Interpretation  Date/Time:  Wednesday February 26 2019 16:28:46 EST Ventricular Rate:  94 PR Interval:    QRS Duration: 52 QT Interval:  547 QTC Calculation: 662 R Axis:   110 Text Interpretation: Atrial fibrillation Ventricular premature complex Probable anterior infarct, age indeterminate Prolonged QT interval Baseline wander in lead(s) V1 Atrial fibrillation Confirmed by Ezequiel Essex [phone]) on 02/26/2019 4:40:59 PM         Leauna Sharber, Annie Main, MD 02/26/19 2057

## 2019-02-26 NOTE — ED Notes (Addendum)
Lab unable to collect blood culture or lactic, awaiting another lab tech to come and attempt to start antibiotics.

## 2019-02-26 NOTE — Discharge Instructions (Signed)
Test showed no life-threatening condition.  Stable for discharge back to the facility.

## 2019-02-26 NOTE — ED Notes (Signed)
Dr Lacinda Axon made aware IV infiltrated, Orders received to hold IV fluids for now.

## 2019-02-26 NOTE — ED Provider Notes (Signed)
[facility] EMERGENCY DEPARTMENT Provider Note   CSN: [id number] Arrival date & time: 02/26/19  1051     History   Chief Complaint Chief Complaint  Patient presents with  . Emesis    HPI Marissa Burns is a 85 y.o. female.     Level 5 caveat for profound dementia and uncertainty of history.  Patient resides at [facility].  Patient allegedly was "unresponsive" earlier today after vomiting.  When EMS arrived, patient was more responsive.  Glucose 148.  Initial BP 86/44 with low sats in the 80s.  I discussed her care with the nursing home.  At baseline she has profound dementia and is hard of hearing.  Negative Covid test 2 weeks ago.     Past Medical History:  Diagnosis Date  . Anxiety   . Cataract   . Colon polyp   . Dementia ([place])   . Diverticulosis   . Edema   . Elevated blood sugar   . Essential hypertension, benign   . High cholesterol   . Hyperlipidemia   . Osteoarthritis   . Osteoporosis   . Other and unspecified hyperlipidemia   . Peripheral vascular disease ([place])   . Renal disorder   . Secondary hyperaldosteronism ([place])   . Thrombophilia ([place])   . Vitamin D deficiency     Patient Active Problem List   Diagnosis Date Noted  . Syncope and collapse 03/12/2018  . Leukocytosis 03/12/2018  . Dehydration 03/12/2018  . Hard of hearing 03/12/2018  . At high risk for falls 03/12/2018  . Acute systolic CHF (congestive heart failure) ([place]) 10/29/2017  . Acute diastolic CHF (congestive heart failure) ([place]) 10/29/2017  . Urinary tract infection without hematuria   . Weakness 10/26/2017  . Acute CHF (congestive heart failure) ([place]) 10/26/2017  . Acute lower UTI 10/26/2017  . BPPV (benign paroxysmal positional vertigo) 12/10/2014  . Visual impairment 01/22/2014  . Chronic constipation 10/08/2013  . Primary osteoarthritis of both knees 10/08/2013  . Hearing deficit 05/20/2013  . Hypertension 10/30/2012  . Hyperlipemia 10/30/2012  . Vitamin D deficiency  10/30/2012  . Arthritis 10/30/2012  . Colon polyp   . Diverticulosis   . Osteoarthritis   . Edema   . Osteoporosis   . Elevated blood sugar   . Cataract     Past Surgical History:  Procedure Laterality Date  . APPENDECTOMY    . CATARACT EXTRACTION    . TONSILLECTOMY       OB History   No obstetric history on file.      Home Medications    Prior to Admission medications   Medication Sig Start Date End Date Taking? Authorizing Provider  acetaminophen (TYLENOL) 500 MG tablet Take 1,000 mg by mouth daily as needed for moderate pain.    Yes [provider]  apixaban (ELIQUIS) 2.5 MG TABS tablet Take 1 tablet (2.5 mg total) by mouth 2 (two) times daily. 12/26/17  Yes Chipper Herb, MD  atorvastatin (LIPITOR) 80 MG tablet TAKE 1 TABLET (80 MG TOTAL) BY MOUTH DAILY. Patient taking differently: Take 20 mg by mouth daily.  01/10/18  Yes Chipper Herb, MD  calcium carbonate (TUMS - DOSED IN MG ELEMENTAL CALCIUM) 500 MG chewable tablet Chew 1 tablet by mouth daily.   Yes [provider]  carvedilol (COREG) 3.125 MG tablet TAKE 1 TABLET (3.125 MG TOTAL) BY MOUTH 2 (TWO) TIMES DAILY WITH A MEAL. 03/19/18  Yes Chipper Herb, MD  furosemide (LASIX) 20 MG tablet  Take 0.5 tablets (10 mg total) by mouth every other day. Patient taking differently: Take 20 mg by mouth daily.  03/18/18  Yes Johnson, Clanford L, MD  lisinopril (PRINIVIL,ZESTRIL) 5 MG tablet Take 1 tablet (5 mg total) by mouth daily. 12/27/17  Yes Chipper Herb, MD  polyethylene glycol [facility] / Floria Raveling) packet Take 17 g by mouth daily.   Yes [provider]  amLODipine (NORVASC) 5 MG tablet TAKE 1 TABLET (5 MG TOTAL) BY MOUTH DAILY. Patient not taking: Reported on 02/26/2019 01/10/18   Chipper Herb, MD  busPIRone (BUSPAR) 5 MG tablet TAKE ONE TABLET BY MOUTH TWICE DAILY FOR ANXIETY Patient not taking: Reported on 02/26/2019 03/18/18   Chipper Herb, MD  Menthol, Topical Analgesic, (BIOFREEZE) 4  % GEL Apply topically BID to effected area. Patient not taking: Reported on 02/26/2019 06/04/17   Chipper Herb, MD    Family History No family history on file.  Social History Social History   Tobacco Use  . Smoking status: Never Smoker  . Smokeless tobacco: Never Used  Substance Use Topics  . Alcohol use: No  . Drug use: No     Allergies   Alendronate sodium, Celebrex [celecoxib], Dilacor [diltiazem hcl], Evista [raloxifene hydrochloride], and Propulsid [cisapride]   Review of Systems Review of Systems  Unable to perform ROS: Dementia     Physical Exam Updated Vital Signs BP 115/70   Pulse 87   Resp 18   Ht 5\' 5"  (1.651 m)   Wt 63.4 kg   SpO2 94%   BMI 23.25 kg/m   Physical Exam Vitals signs and nursing note reviewed.  Constitutional:      Appearance: She is well-developed.     Comments: Demented  HENT:     Head: Normocephalic and atraumatic.  Eyes:     Conjunctiva/sclera: Conjunctivae normal.  Neck:     Musculoskeletal: Neck supple.  Cardiovascular:     Rate and Rhythm: Normal rate and regular rhythm.  Pulmonary:     Effort: Pulmonary effort is normal.     Breath sounds: Normal breath sounds.  Abdominal:     General: Bowel sounds are normal.     Palpations: Abdomen is soft.  Musculoskeletal:     Comments: Unable  Skin:    General: Skin is warm and dry.  Neurological:     Comments: Unable  Psychiatric:     Comments: Demented      ED Treatments / Results  Labs (all labs ordered are listed, but only abnormal results are displayed) Labs Reviewed  CBC WITH DIFFERENTIAL/PLATELET - Abnormal; Notable for the following components:      Result Value   WBC 12.5 (*)    HCT 47.8 (*)    MCV 103.5 (*)    RDW 15.9 (*)    Neutro Abs 11.2 (*)    All other components within normal limits  BASIC METABOLIC PANEL - Abnormal; Notable for the following components:   Glucose, Bld 122 (*)    Calcium 8.2 (*)    All other components within normal limits   URINALYSIS, ROUTINE W REFLEX MICROSCOPIC - Abnormal; Notable for the following components:   APPearance HAZY (*)    All other components within normal limits  SARS CORONAVIRUS 2 (TAT 6-24 HRS)    EKG None  Radiology No results found.  Procedures Procedures (including critical care time)  Medications Ordered in ED Medications  0.9 %  sodium chloride infusion ( Intravenous Stopped 02/26/19 1254)  Initial Impression / Assessment and Plan / ED Course  I have reviewed the triage vital signs and the nursing notes.  Pertinent labs & imaging results that were available during my care of the patient were reviewed by me and considered in my medical decision making (see chart for details).        Discussed care with grandson Marya Amsler at [phone].  He recommended an evaluation.  Patient rechecked multiple times.  She is demented, but frisky.  Moving all extremities.  Does not appear to be obtunded at all.  No life-threatening conditions noted.   CRITICAL CARE Performed by: Nat Christen Total critical care time: 30 minutes Critical care time was exclusive of separately billable procedures and treating other patients. Critical care was necessary to treat or prevent imminent or life-threatening deterioration. Critical care was time spent personally by me on the following activities: development of treatment plan with patient and/or surrogate as well as nursing, discussions with consultants, evaluation of patient's response to treatment, examination of patient, obtaining history from patient or surrogate, ordering and performing treatments and interventions, ordering and review of laboratory studies, ordering and review of radiographic studies, pulse oximetry and re-evaluation of patient's condition.  Final Clinical Impressions(s) / ED Diagnoses   Final diagnoses:  Altered mental status, unspecified altered mental status type    ED Discharge Orders    None       Nat Christen, MD  02/26/19 1530

## 2019-02-26 NOTE — Progress Notes (Signed)
Pharmacy Antibiotic Note  Marissa Burns is a 85 y.o. female admitted on 02/26/2019 with pneumonia.  Pharmacy has been consulted for Vancomycin dosing.  Plan: Vancomycin 1250mg  IV loading dose, then 1000mg  IV every 24 hours.  Goal trough 15-20 mcg/mL.  Cefepime 2gm IV q12h F/U cxs and clinical progress Monitor V/S, labs and levels as indicated  Height: 5\' 5"  (165.1 cm) Weight: 139 lb 11.2 oz (63.4 kg) IBW/kg (Calculated) : 57  No data recorded.  Recent Labs  Lab 02/26/19 1207  WBC 12.5*  CREATININE 0.64    Estimated Creatinine Clearance: 32.8 mL/min (by C-G formula based on SCr of 0.64 mg/dL).    Allergies  Allergen Reactions  . Alendronate Sodium Other (See Comments)    unknown  . Celebrex [Celecoxib] Other (See Comments)    unknown  . Dilacor [Diltiazem Hcl] Other (See Comments)    unknown  . Evista [Raloxifene Hydrochloride] Other (See Comments)    unknown  . Propulsid [Cisapride] Other (See Comments)    unknown    Antimicrobials this admission: Vancomycin 11/18 >>  Cefepime 11/18 >>   Microbiology results: 11/18 BCx: pending 11/18 UCx: pending 11/18 SARS-2 CV pending  MRSA PCR:   Thank you for allowing pharmacy to be a part of this patient's care.  Isac Sarna, BS [place], [place] Clinical Pharmacist Pager [phone] 02/26/2019 4:07 PM

## 2019-02-26 NOTE — ED Triage Notes (Signed)
Pt brought to ED via [facility]. Per EMS, called out to [facility] for unresponsive, per NF pt was found had vomited and unresponsive, pt was found responsive by EMS. CBG 148. BP 86/44 O2 80's then placed on 4L by EMS.

## 2019-02-26 NOTE — H&P (Signed)
History and Physical    PLEASE NOTE THAT DRAGON DICTATION SOFTWARE WAS USED IN THE CONSTRUCTION OF THIS NOTE.   DAMONI ERKER EHO:[id number] DOB: 01/02/1915 DOA: 02/26/2019  PCP: Chipper Herb, MD (Inactive) Patient coming from: [facility] SN[facility]  I have personally briefly reviewed patient's old medical records in [place]  Chief Complaint: Diminished responsiveness  HPI: Marissa Burns is a 85 y.o. female with medical history significant for advanced dementia, hypertension, chronic atrial fibrillation chronically anticoagulated on Eliquis, who is admitted to [facility] on 02/26/2019 with suspected HCAP pneumonia after presenting via EMS from [facility] to [facility] emergency department for further evaluation of diminished responsiveness.   In the setting of patient's advanced dementia, the following history is obtained via my discussions with the emergency department physician, Dr Lacinda Axon, and via chart review.   The patient reportedly had an episode of diminished responsiveness earlier today, although the details of this are not completely clear to me, nor is it clear to me if the patient was unconscious during this episode of diminished responsiveness.  Regardless, the patient's diminished responsiveness prompted SNF staff to contact EMS, and by the time EMS had arrived, patient's mental status had improved back to baseline, at which it has subsequently remained.  She was also noted to be mildly hypoxic per EMS with initial oxygen saturations in the high 80s in the context of no baseline supplemental oxygen requirements. It is unclear to me if the patient's episode of diminished responsiveness involved syncope or confusion relative to baseline dementia.  Of note, the patient was hospitalized at [facility] in December 2019 for community-acquired pneumonia after presenting for evaluation of an episode of diminished responsiveness.  The patient was also noted to  exhibit an episode of vomiting at the SNF earlier today, although, per SNF staff, no evidence of associated aspiration associated this.  The emergency department physician spoke with the patient's grandson/POA, who confirmed that the patient is to be DNR/DNI.  He consents to conservative work-up and management, including cultures and antibiotics.    ED Course: Vital signs in the emergency department notable for the following: Temperature max 97.8; heart rate 77-87; initial blood pressure noted to be 102/54, which increased to 115/70 following interval IV fluids, as further described below; respiratory rate 18-20, and oxygen saturation 94% on room air.  Labs in the ED were notable for the following: BMP notable for sodium 141, bicarbonate 25, BUN 22 compared to most recent prior BUN data point of 20 when checked on 03/14/2018, creatinine 0.64 compared to most recent prior creatinine value of 0.62 when checked on 03/14/2018, BUN/creatinine ratio 34; CBC notable for white blood cell count of 12,500 with 90% neutrophils.  Lactic acid level ordered, with result currently pending.  Urinalysis showed no white blood cells, and was found to be nitrate negative as well as leukocyte esterase negative, but did show an elevated specific gravity 1.024.  Blood cultures x2 were collected prior to initiation of any antibiotics, and Covid 19 nasopharyngeal swab was obtained, with result currently pending.  Chest x-ray showed evidence of left lower lobe infiltrate consistent with pneumonia, with small left pleural effusion, but otherwise showed no evidence of acute cardiopulmonary process, including no evidence of edema or pneumothorax.  EKG performed in the ED today, by comparison to prior EKG from 03/12/2018 showed atrial fibrillation with ventricular rate 94, prolonged QTC of 662 MS, which was increased from QTC of 442 ms on prior, potential  Q waves in aVL and V1, which are unchanged from prior, nonspecific T wave flattening  in V4 through V6, and no evidence of ST changes.  While in the ED, the following were administered: Ativan 0.5 mg IV IV x1 in order to reduce agitation exhibited by the patient in order to obtain EKG of more optimal quality, IV normal saline x1 L bolus, cefepime 2 g IV x1, and a dose of IV vancomycin.  Subsequently, the patient was admitted to the med telemetry floor for further evaluation and management of suspected healthcare associated pneumonia.    Review of Systems: As per HPI otherwise 10 point review of systems negative.   Past Medical History:  Diagnosis Date   Anxiety    Cataract    Colon polyp    Dementia ([place])    Diverticulosis    Edema    Elevated blood sugar    Essential hypertension, benign    High cholesterol    Hyperlipidemia    Osteoarthritis    Osteoporosis    Other and unspecified hyperlipidemia    Peripheral vascular disease (HCC)    Renal disorder    Secondary hyperaldosteronism ([place])    Thrombophilia ([place])    Vitamin D deficiency     Past Surgical History:  Procedure Laterality Date   APPENDECTOMY     CATARACT EXTRACTION     TONSILLECTOMY      Social History:  reports that she has never smoked. She has never used smokeless tobacco. She reports that she does not drink alcohol or use drugs.   Allergies  Allergen Reactions   Alendronate Sodium Other (See Comments)    unknown   Celebrex [Celecoxib] Other (See Comments)    unknown   Dilacor [Diltiazem Hcl] Other (See Comments)    unknown   Evista [Raloxifene Hydrochloride] Other (See Comments)    unknown   Propulsid [Cisapride] Other (See Comments)    unknown    No family history on file. Family history reviewed and not pertinent.    Prior to Admission medications   Medication Sig Start Date End Date Taking? Authorizing Provider  acetaminophen (TYLENOL) 500 MG tablet Take 1,000 mg by mouth daily as needed for moderate pain.    Yes [provider]    apixaban (ELIQUIS) 2.5 MG TABS tablet Take 1 tablet (2.5 mg total) by mouth 2 (two) times daily. 12/26/17  Yes Chipper Herb, MD  atorvastatin (LIPITOR) 80 MG tablet TAKE 1 TABLET (80 MG TOTAL) BY MOUTH DAILY. Patient taking differently: Take 20 mg by mouth daily.  01/10/18  Yes Chipper Herb, MD  calcium carbonate (TUMS - DOSED IN MG ELEMENTAL CALCIUM) 500 MG chewable tablet Chew 1 tablet by mouth daily.   Yes [provider]  carvedilol (COREG) 3.125 MG tablet TAKE 1 TABLET (3.125 MG TOTAL) BY MOUTH 2 (TWO) TIMES DAILY WITH A MEAL. 03/19/18  Yes Chipper Herb, MD  furosemide (LASIX) 20 MG tablet Take 0.5 tablets (10 mg total) by mouth every other day. Patient taking differently: Take 20 mg by mouth daily.  03/18/18  Yes Johnson, Clanford L, MD  lisinopril (PRINIVIL,ZESTRIL) 5 MG tablet Take 1 tablet (5 mg total) by mouth daily. 12/27/17  Yes Chipper Herb, MD  polyethylene glycol [facility] / Floria Raveling) packet Take 17 g by mouth daily.   Yes [provider]  amLODipine (NORVASC) 5 MG tablet TAKE 1 TABLET (5 MG TOTAL) BY MOUTH DAILY. Patient not taking: Reported on 02/26/2019 01/10/18   Laurance Flatten,  Estella Husk, MD  busPIRone (BUSPAR) 5 MG tablet TAKE ONE TABLET BY MOUTH TWICE DAILY FOR ANXIETY Patient not taking: Reported on 02/26/2019 03/18/18   Chipper Herb, MD  Menthol, Topical Analgesic, (BIOFREEZE) 4 % GEL Apply topically BID to effected area. Patient not taking: Reported on 02/26/2019 06/04/17   Chipper Herb, MD     Objective    Physical Exam: Vitals:   02/26/19 1105 02/26/19 1200 02/26/19 1230 02/26/19 1400  BP: (!) 102/54 113/86 109/65 115/70  Pulse: 87   87  Resp: 20   18  SpO2: 94%   94%  Weight:      Height:        General: appears to be stated age; alert, but not oriented to person, place, time, or self Skin: warm, dry Head:  AT/[place] Eyes:  PEARL b/l, EOMI Mouth:  Oral mucosa membranes appear dry, normal dentition Neck: supple; trachea midline Heart:   Irregular but rate controlled ; did not appreciate any M/R/G Lungs: Diminished bibasilar breath sounds, but otherwise CTAB; did not appreciate any wheezes, rales, or rhonchi Abdomen: + BS; soft, ND Vascular: 2+ pedal pulses b/l; 2+ radial pulses b/l Extremities: no peripheral edema, no muscle wasting Neuro: In the setting of advanced dementia, patient unable to follow instructions and thereby unable to perform full neurologic assessment, although patient noted to be spontaneously moving all 4 extremities.  Labs on Admission: I have personally reviewed following labs and imaging studies  CBC: Recent Labs  Lab 02/26/19 1207  WBC 12.5*  NEUTROABS 11.2*  HGB 15.0  HCT 47.8*  MCV 103.5*  PLT 144   Basic Metabolic Panel: Recent Labs  Lab 02/26/19 1207  NA 141  K 4.3  CL 106  CO2 25  GLUCOSE 122*  BUN 22  CREATININE 0.64  CALCIUM 8.2*   GFR: Estimated Creatinine Clearance: 32.8 mL/min (by C-G formula based on SCr of 0.64 mg/dL). Liver Function Tests: No results for input(s): AST, ALT, ALKPHOS, BILITOT, PROT, ALBUMIN in the last 168 hours. No results for input(s): LIPASE, AMYLASE in the last 168 hours. No results for input(s): AMMONIA in the last 168 hours. Coagulation Profile: No results for input(s): INR, PROTIME in the last 168 hours. Cardiac Enzymes: No results for input(s): CKTOTAL, CKMB, CKMBINDEX, TROPONINI in the last 168 hours. BNP (last 3 results) No results for input(s): PROBNP in the last 8760 hours. HbA1C: No results for input(s): HGBA1C in the last 72 hours. CBG: No results for input(s): GLUCAP in the last 168 hours. Lipid Profile: No results for input(s): CHOL, HDL, LDLCALC, TRIG, CHOLHDL, LDLDIRECT in the last 72 hours. Thyroid Function Tests: No results for input(s): TSH, T4TOTAL, FREET4, T3FREE, THYROIDAB in the last 72 hours. Anemia Panel: No results for input(s): VITAMINB12, FOLATE, FERRITIN, TIBC, IRON, RETICCTPCT in the last 72 hours. Urine  analysis:    Component Value Date/Time   COLORURINE YELLOW 02/26/2019 1140   APPEARANCEUR HAZY (A) 02/26/2019 1140   LABSPEC 1.024 02/26/2019 1140   PHURINE 5.0 02/26/2019 1140   GLUCOSEU NEGATIVE 02/26/2019 1140   HGBUR NEGATIVE 02/26/2019 [place] 02/26/2019 1140   BILIRUBINUR neg 06/04/2014 [place] 02/26/2019 1140   PROTEINUR NEGATIVE 02/26/2019 1140   UROBILINOGEN 0.2 12/07/2014 1322   NITRITE NEGATIVE 02/26/2019 [place] 02/26/2019 1140    Radiological Exams on Admission: Dg Chest Port 1 View  Result Date: 02/26/2019 CLINICAL DATA:  Unresponsive EXAM: PORTABLE CHEST 1 VIEW COMPARISON:  March 12, 2018 FINDINGS: There is consolidation in the left base with small left pleural effusion. Lungs elsewhere are clear. Heart is mildly enlarged with pulmonary vascularity normal. No adenopathy. There is aortic atherosclerosis. There is a skin fold on the right. No pneumothorax. No bone lesions. IMPRESSION: Consolidation left base with small left pleural effusion. Suspect pneumonia, although aspiration could present in this manner. Both pneumonia and aspiration may present concurrently. Lungs elsewhere clear. Heart mildly enlarged but stable. Aortic Atherosclerosis (ICD10-I70.0). Electronically Signed   By: Lowella Grip III M.D.   On: 02/26/2019 15:34    EKG: Independently reviewed, with result as described above.   Assessment/Plan   TAURIEL SCRONCE is a 85 y.o. female with medical history significant for advanced dementia, hypertension, chronic atrial fibrillation chronically anticoagulated on Eliquis, who is admitted to [facility] on 02/26/2019 with suspected HCAP pneumonia after presenting via EMS from [facility] SN[facility] to [facility] emergency department for further evaluation of diminished responsiveness.    Principal Problem:   HCAP (healthcare-associated pneumonia) Active Problems:   Hypertension    Hyperlipemia   Chronic constipation   Leukocytosis   Acute metabolic encephalopathy  #) HCAP pneumonia: Diagnosis on the basis of mild initial acute hypoxic respiratory distress per EMS, with presenting chest x-ray showing evidence of left lower lobe infiltrate concerning for pneumonia, which, in the context of patient's residence at SNF would warrant broaden antibiotic coverage consistent with HCAP coverage.  While the patient does have leukocytosis, no additional sirs criteria are met at this time, and therefore patient does not meet criteria to be considered sepsis at present.  Of note, per chart review, it appears that the patient was admitted to [facility] in December 2019 with similar, at which time she presented for evaluation of diminished responsiveness and was found to have pneumonia.  While in the ED this evening, the patient received dose of IV cefepime as well as dose of IV vancomycin after collection of blood cultures x2.  Plan: Continue broad-spectrum antibiotic coverage with IV vancomycin and cefepime.  Check MRSA PCR, which if negative, will plan to discontinue IV vancomycin given the high negative predictive value of a negative MRSA finding in the setting of pneumonia.  Follow for results of blood cultures x2.  Repeat CBC with differential in the morning.  As needed supplemental oxygen order to maintain oxygen saturations greater than or equal to 92%.  Check strep pneumoniae urine antigen.  Follow for results of COVID-19 nasopharyngeal swab that was performed in the ED this evening.       #) Acute encephalopathy: Patient noted to exhibit diminished responsiveness earlier today at [facility] facility, although the details of such are not completely clear to me, as further described above.  I suspect some metabolic contribution from presenting underlying pneumonia, which would render any associated acute encephalopathy to have a metabolic classification.  Per SNF staff, patient's mental  status is subsequently improved and is now at baseline, at which the patient is confused, agitated, and not oriented to person, place, time, or self.   Plan: Work-up and management of suspected HCAP pneumonia, as further described above.  Repeat BMP in the morning.     #) Dehydration: An element of dehydration is clinically suspected on the basis of findings of prerenal azotemia as well as elevated specific gravity associated with presenting urinalysis.  Suspect contribution from increase in GI losses relating to reported episode of vomiting x1 that occurred earlier today.  Suspected dehydration appears to  be further supported by increase in blood pressure following administration of a 1 L IV normal saline bolus administered in the ED.  As the patient is normotensive and now eating and drinking, I will refrain from administration of additional IV fluids for now so as to not volume overload this 85 yo patient with BMI under 20.   Plan: We will attempt to monitor strict I's and O's.  Repeat BMP in the morning.     #) Prolonged QTC: Presenting EKG reflects QTC prolongation of 662 ms, relative to QTC of 442 ms which was associated EKG from 03/12/2018.   Plan: Add on serum magnesium level to labs already collected in the ED, plan for as needed supplementation in order to maintain serum magnesium level of greater than or equal to 2.0.  We will also repeat serum magnesium level in the morning.  Repeat BMP in the morning.  Monitor on telemetry.  Attempt to avoid agents with known QTC prolonging capabilities.       #) Chronic atrial fibrillation: In the setting of CHA2DS2-VASc score of 7, the patient is chronically anticoagulated on age and weight adjusted dose of Eliquis.  Outpatient AV nodal blocking agent limited to Coreg.  Appears to be in rate controlled atrial fibrillation at time of presentation.  Plan: Continue home Coreg as sole outpatient AV nodal blocking agent.  Continue home Eliquis.  Add  on serum magnesium level to labs collected in the ED.  Will be monitoring on telemetry in the context of concomitant prolonged QTC.     #) History of essential hypertension: Outpatient antihypertensive regimen includes Coreg as well as lisinopril.  No evidence of hypotension since presentation.   Plan: Continue home Coreg, as above, but will hold home lisinopril for now in the setting of suspected infectious presentation.  Close monitoring of ensuing blood pressures via routine vital signs.     #) Hyperlipidemia: On atorvastatin as an outpatient.  Plan: Continue home statin.    DVT prophylaxis: Continue home Eliquis Code Status: DNR/DNI, as confirmed by the patient's POA today via discussions with the ED physician, as documented above. Family Communication: Case discussed with the patient's grandson/POA Marya Amsler), as above. Disposition Plan:  Per Rounding Team Consults called: None  Admission status: Inpatient; med telemetry.    PLEASE NOTE THAT DRAGON DICTATION SOFTWARE WAS USED IN THE CONSTRUCTION OF THIS NOTE.   [facility] Pager [phone] From 3PM- 11PM.   Otherwise, please contact night-coverage  [email or web address] Password [email or web address]  02/26/2019, 4:21 PM

## 2019-02-26 NOTE — ED Notes (Signed)
Lab only able to obtain one set of cultures.

## 2019-02-27 ENCOUNTER — Other Ambulatory Visit: Payer: Self-pay

## 2019-02-27 DIAGNOSIS — J9600 Acute respiratory failure, unspecified whether with hypoxia or hypercapnia: Secondary | ICD-10-CM

## 2019-02-27 DIAGNOSIS — G9341 Metabolic encephalopathy: Secondary | ICD-10-CM | POA: Diagnosis present

## 2019-02-27 DIAGNOSIS — U071 COVID-19: Secondary | ICD-10-CM

## 2019-02-27 DIAGNOSIS — R4182 Altered mental status, unspecified: Secondary | ICD-10-CM

## 2019-02-27 DIAGNOSIS — E782 Mixed hyperlipidemia: Secondary | ICD-10-CM

## 2019-02-27 LAB — CBC WITH DIFFERENTIAL/PLATELET
Abs Immature Granulocytes: 0.03 10*3/uL (ref 0.00–0.07)
Basophils Absolute: 0 10*3/uL (ref 0.0–0.1)
Basophils Relative: 0 %
Eosinophils Absolute: 0.1 10*3/uL (ref 0.0–0.5)
Eosinophils Relative: 0 %
HCT: 41.7 % (ref 36.0–46.0)
Hemoglobin: 12.7 g/dL (ref 12.0–15.0)
Immature Granulocytes: 0 %
Lymphocytes Relative: 7 %
Lymphs Abs: 0.8 10*3/uL (ref 0.7–4.0)
MCH: 32.2 pg (ref 26.0–34.0)
MCHC: 30.5 g/dL (ref 30.0–36.0)
MCV: 105.6 fL — ABNORMAL HIGH (ref 80.0–100.0)
Monocytes Absolute: 0.6 10*3/uL (ref 0.1–1.0)
Monocytes Relative: 5 %
Neutro Abs: 10 10*3/uL — ABNORMAL HIGH (ref 1.7–7.7)
Neutrophils Relative %: 88 %
Platelets: 212 10*3/uL (ref 150–400)
RBC: 3.95 MIL/uL (ref 3.87–5.11)
RDW: 15.6 % — ABNORMAL HIGH (ref 11.5–15.5)
WBC: 11.5 10*3/uL — ABNORMAL HIGH (ref 4.0–10.5)
nRBC: 0 % (ref 0.0–0.2)

## 2019-02-27 LAB — HEPATIC FUNCTION PANEL
ALT: 12 U/L (ref 0–44)
AST: 17 U/L (ref 15–41)
Albumin: 2.2 g/dL — ABNORMAL LOW (ref 3.5–5.0)
Alkaline Phosphatase: 86 U/L (ref 38–126)
Bilirubin, Direct: 0.3 mg/dL — ABNORMAL HIGH (ref 0.0–0.2)
Indirect Bilirubin: 0.6 mg/dL (ref 0.3–0.9)
Total Bilirubin: 0.9 mg/dL (ref 0.3–1.2)
Total Protein: 4.6 g/dL — ABNORMAL LOW (ref 6.5–8.1)

## 2019-02-27 LAB — COMPREHENSIVE METABOLIC PANEL
ALT: 11 U/L (ref 0–44)
AST: 18 U/L (ref 15–41)
Albumin: 2.2 g/dL — ABNORMAL LOW (ref 3.5–5.0)
Alkaline Phosphatase: 88 U/L (ref 38–126)
Anion gap: 11 (ref 5–15)
BUN: 19 mg/dL (ref 8–23)
CO2: 19 mmol/L — ABNORMAL LOW (ref 22–32)
Calcium: 7.8 mg/dL — ABNORMAL LOW (ref 8.9–10.3)
Chloride: 110 mmol/L (ref 98–111)
Creatinine, Ser: 0.48 mg/dL (ref 0.44–1.00)
GFR calc Af Amer: >60 mL/min (ref >60)
GFR calc non Af Amer: >60 mL/min (ref >60)
Glucose, Bld: 118 mg/dL — ABNORMAL HIGH (ref 70–99)
Potassium: 3.8 mmol/L (ref 3.5–5.1)
Sodium: 140 mmol/L (ref 135–145)
Total Bilirubin: 0.9 mg/dL (ref 0.3–1.2)
Total Protein: 4.7 g/dL — ABNORMAL LOW (ref 6.5–8.1)

## 2019-02-27 LAB — SARS CORONAVIRUS 2 (TAT 6-24 HRS): SARS Coronavirus 2: POSITIVE — AB

## 2019-02-27 LAB — MAGNESIUM: Magnesium: 2 mg/dL (ref 1.7–2.4)

## 2019-02-27 LAB — PROCALCITONIN: Procalcitonin: 0.19 ng/mL

## 2019-02-27 MED ORDER — DEXAMETHASONE SODIUM PHOSPHATE 10 MG/ML IJ SOLN: 6.0000 mg | INTRAMUSCULAR | Status: DC

## 2019-02-27 MED ORDER — PIPERACILLIN-TAZOBACTAM 3.375 G IVPB
3.3750 g | Freq: Three times a day (TID) | INTRAVENOUS | Status: DC
  Administered 2019-02-27 – 2019-03-01 (×6): 3.375 g via INTRAVENOUS
  Filled 2019-02-27 (×6): qty 50

## 2019-02-27 MED ORDER — CARVEDILOL 3.125 MG PO TABS: 3.1250 mg | ORAL_TABLET | Freq: Two times a day (BID) | ORAL | Status: DC

## 2019-02-27 NOTE — Progress Notes (Signed)
CRITICAL VALUE ALERT  Critical Value:  covid positive  Date & Time Notied:  02/27/19- 0040  Provider Notified: Dr. Maudie Mercury   Orders Received/Actions taken: Orders given to transfer to [facility].

## 2019-02-27 NOTE — Progress Notes (Addendum)
PROGRESS NOTE  Marissa Burns [id number] DOB: 07-29-1914 DOA: 02/26/2019 PCP: Chipper Herb, MD (Inactive)  Brief History:   85 y.o. female with medical history significant for advanced dementia, hypertension, chronic atrial fibrillation chronically anticoagulated on Eliquis, who is admitted to [facility] on 02/26/2019 with reportedly had an episode of diminished responsiveness earlier today, although the details of this are not completely clear to me, nor is it clear to me if the patient was unconscious during this episode of diminished responsiveness.  The patient is unable to provide any history due to her advanced dementia.  She was also noted to be mildly hypoxic per EMS with initial oxygen saturations in the high 80s in the context of no baseline supplemental oxygen requirements.  Of note, the patient was hospitalized at [facility] in December 2019 for community-acquired pneumonia after presenting for evaluation of an episode of diminished responsiveness.  The patient was also noted to exhibit an episode of vomiting at the SNF earlier today, although, per SNF staff, no evidence of associated aspiration associated this.  The emergency department physician spoke with the patient's grandson/POA, who confirmed that the patient is to be DNR/DNI.  He consents to conservative work-up and management, including cultures and antibiotics.    ED Course: Vital signs in the emergency department notable for the following: Temperature max 97.8; heart rate 77-87; initial blood pressure noted to be 102/54, which increased to 115/70 following interval IV fluids.    Assessment/Plan: Acute respiratory failure with hypoxia -secondary to HCAP -spoke with Dr. Malvin Johns had [place] in September 2020 -oxygen saturation fluctuating between 88-93% on RA -concerned about aspiration -d/c vanco -d/c cefepime -start zosyn -Follow blood cultures -personally reviewed CXR--increased  interstitial markings,?LLL opacity -CT chest  Acute metabolic encephalopathy -Patient is awake and alert, but confused and intermittently agitated during my examination -Likely near her usual baseline at which the patient is confused, agitated, and not oriented to person, place, time, or self.   Chronic atrial fibrillation -Rate controlled -Restart Coreg -Continue apixaban  Essential hypertension -Continue carvedilol -Holding lisinopril secondary to initially soft blood pressures  Hyperlipidemia -Continue statin      Disposition Plan: SNF  1-2 days Family Communication:   No Family at bedside  Consultants:  none  Code Status:  DNR  DVT Prophylaxis:  apixaban   Procedures: As Listed in Progress Note Above  Antibiotics: Cefepime 11/18>>      Subjective: Patient is confused and intermittently agitated and screaming.  Review of systems unobtainable.  There is no respiratory distress, vomiting, diarrhea, uncontrolled pain reported.  Objective: Vitals:   02/26/19 1530 02/26/19 1730 02/26/19 2039 02/27/19 0630  BP: (!) 130/99 131/75 131/82 127/78  Pulse:   (!) 102 80  Resp:   20 20  Temp:   97.8 F (36.6 C) 97.9 F (36.6 C)  TempSrc:   Oral Oral  SpO2:   (!) 88% 100%  Weight:   53.7 kg   Height:   5\' 5"  (1.651 m)     Intake/Output Summary (Last 24 hours) at 02/27/2019 0939 Last data filed at 02/27/2019 0647 Gross per 24 hour  Intake 530.12 ml  Output 150 ml  Net 380.12 ml   Weight change:  Exam:   General:  Pt is alert, follows commands appropriately, not in acute distress  HEENT: No icterus, No thrush, No neck mass, [place]/AT  Cardiovascular: IRRR, S1/S2, no rubs, no gallops  Respiratory: Bibasilar rales.  No wheezing.  Abdomen: Soft/+BS, non tender, non distended, no guarding  Extremities: 1 +LE edema, No lymphangitis, No petechiae, No rashes, no synovitis   Data Reviewed: I have personally reviewed following labs and imaging studies Basic  Metabolic Panel: Recent Labs  Lab 02/26/19 1207  NA 141  K 4.3  CL 106  CO2 25  GLUCOSE 122*  BUN 22  CREATININE 0.64  CALCIUM 8.2*  MG 2.1   Liver Function Tests: No results for input(s): AST, ALT, ALKPHOS, BILITOT, PROT, ALBUMIN in the last 168 hours. No results for input(s): LIPASE, AMYLASE in the last 168 hours. No results for input(s): AMMONIA in the last 168 hours. Coagulation Profile: No results for input(s): INR, PROTIME in the last 168 hours. CBC: Recent Labs  Lab 02/26/19 1207  WBC 12.5*  NEUTROABS 11.2*  HGB 15.0  HCT 47.8*  MCV 103.5*  PLT 274   Cardiac Enzymes: No results for input(s): CKTOTAL, CKMB, CKMBINDEX, TROPONINI in the last 168 hours. BNP: Invalid input(s): POCBNP CBG: No results for input(s): GLUCAP in the last 168 hours. HbA1C: No results for input(s): HGBA1C in the last 72 hours. Urine analysis:    Component Value Date/Time   COLORURINE YELLOW 02/26/2019 1140   APPEARANCEUR HAZY (A) 02/26/2019 1140   LABSPEC 1.024 02/26/2019 1140   PHURINE 5.0 02/26/2019 1140   GLUCOSEU NEGATIVE 02/26/2019 1140   HGBUR NEGATIVE 02/26/2019 1140   [place] 02/26/2019 1140   BILIRUBINUR neg 06/04/2014 1011   KETONESUR NEGATIVE 02/26/2019 1140   PROTEINUR NEGATIVE 02/26/2019 1140   UROBILINOGEN 0.2 12/07/2014 1322   NITRITE NEGATIVE 02/26/2019 1140   LEUKOCYTESUR NEGATIVE 02/26/2019 1140   Sepsis Labs: @LABRCNTIP (procalcitonin:4,lacticidven:4) ) Recent Results (from the past 240 hour(s))  SARS CORONAVIRUS 2 ([place] 6-24 HRS) Nasopharyngeal Nasopharyngeal Swab     Status: Abnormal   Collection Time: 02/26/19  3:00 PM   Specimen: Nasopharyngeal Swab  Result Value Ref Range Status   SARS Coronavirus 2 POSITIVE (A) NEGATIVE Final    Comment: RESULT CALLED TO, READ BACK BY AND VERIFIED WITH: C THOMAS,RN 0027 02/27/2019 D BRADLEY (NOTE) SARS-CoV-2 target nucleic acids are DETECTED. The SARS-CoV-2 RNA is generally detectable in upper and  lower respiratory specimens during the acute phase of infection. Positive results are indicative of active infection with SARS-CoV-2. Clinical  correlation with patient history and other diagnostic information is necessary to determine patient infection status. Positive results do  not rule out bacterial infection or co-infection with other viruses. The expected result is Negative. Fact Sheet for Patients: SugarRoll.be Fact Sheet for Healthcare Providers: https://www.woods-mathews.com/ This test is not yet approved or cleared by the [place] FDA and  has been authorized for detection and/or diagnosis of SARS-CoV-2 by FDA under an Emergency Use Authorization (EUA). This EUA will remain  in effect (meaning this test can be used) for  the duration of the COVID-19 declaration under Section 564(b)(1) of the Act, 21 U.S.C. section 360bbb-3(b)(1), unless the authorization is terminated or revoked sooner. Performed at [facility], [place] [address], [place], [place] [address]   Blood culture (routine x 2)     Status: None (Preliminary result)   Collection Time: 02/26/19  4:56 PM   Specimen: BLOOD  Result Value Ref Range Status   Specimen Description BLOOD RIGHT ANTECUBITAL  Final   Special Requests Blood Culture adequate volume AEROBIC BOTTLE ONLY  Final   Culture   Final    NO GROWTH < 24 HOURS Performed at [facility], [address] [place], [place] [address]    Report Status PENDING  Incomplete  MRSA PCR Screening     Status: None   Collection Time: 02/26/19  8:54 PM   Specimen: Nasal Mucosa; Nasopharyngeal  Result Value Ref Range Status   MRSA by PCR NEGATIVE NEGATIVE Final    Comment:        The GeneXpert MRSA Assay (FDA approved for NASAL specimens only), is one component of a comprehensive MRSA colonization surveillance program. It is not intended to diagnose MRSA infection nor to guide or monitor treatment for MRSA  infections. Performed at [facility], [address], [place], [place] [address]      Scheduled Meds: . apixaban  2.5 mg Oral BID  . atorvastatin  20 mg Oral q1800  . calcium carbonate  1 tablet Oral Daily  . carvedilol  3.125 mg Oral BID WC  . chlorhexidine  15 mL Mouth Rinse BID  . feeding supplement (ENSURE ENLIVE)  237 mL Oral BID BM  . mouth rinse  15 mL Mouth Rinse q12n4p  . polyethylene glycol  17 g Oral Daily  . sodium chloride flush  3 mL Intravenous Q12H   Continuous Infusions: . sodium chloride 500 mL (02/27/19 0640)  . ceFEPime (MAXIPIME) IV 2 g (02/27/19 0641)  . vancomycin      Procedures/Studies: Dg Chest Port 1 View  Result Date: 02/26/2019 CLINICAL DATA:  Unresponsive EXAM: PORTABLE CHEST 1 VIEW COMPARISON:  March 12, 2018 FINDINGS: There is consolidation in the left base with small left pleural effusion. Lungs elsewhere are clear. Heart is mildly enlarged with pulmonary vascularity normal. No adenopathy. There is aortic atherosclerosis. There is a skin fold on the right. No pneumothorax. No bone lesions. IMPRESSION: Consolidation left base with small left pleural effusion. Suspect pneumonia, although aspiration could present in this manner. Both pneumonia and aspiration may present concurrently. Lungs elsewhere clear. Heart mildly enlarged but stable. Aortic Atherosclerosis (ICD10-I70.0). Electronically Signed   By: Lowella Grip III M.D.   On: 02/26/2019 15:34    Orson Eva, DO  [facility] Pager [phone]  If 7PM-7AM, please contact night-coverage [email or web address] Password [email or web address] 02/27/2019, 9:39 AM   LOS: 1 day

## 2019-02-27 NOTE — Progress Notes (Signed)
Infection Prevention  Discussed with RN's today, Raquel Sarna and Kristi 02/27/2019 Unit: Regarding: IP Recommendation: Patient previously positive for COVID initially on 12/11/2018 per note in chart from a conversation with [facility].  IP Recommendation: Following [place] isolation guidance based on CDC guidance, patients who are  > than 21 days(if immunocompromised and/or had moderate disease) since diagnosis and within 90 days of diagnosis and have resolving symptoms, do  not require retesting.  PPE after this time is universal mask and eye protection for staff, patient should mask when staff are present. Providers consult with Infectious Disease if needed.

## 2019-02-27 NOTE — NC FL2 (Signed)
[place] LEVEL OF CARE SCREENING TOOL     IDENTIFICATION  Patient Name: Marissa Burns Birthdate: [birth year] Sex: female Admission Date (Current Location): 02/26/2019  [facility] and [place] Number:  [employer] and Address:  [place] [address] [place]      Provider Number: [id number]  Attending Physician Name and Address:  Orson Eva, MD  Relative Name and Phone Number:       Current Level of Care: Hospital Recommended Level of Care: [place] Prior Approval Number:    Date Approved/Denied:   PASRR Number:    Discharge Plan: SNF    Current Diagnoses: Patient Active Problem List   Diagnosis Date Noted  . Acute metabolic encephalopathy [id number]  . Acute respiratory failure due to COVID-19 ([place]) 02/27/2019  . Altered mental status   . HCAP (healthcare-associated pneumonia) 02/26/2019  . Syncope and collapse 03/12/2018  . Leukocytosis 03/12/2018  . Dehydration 03/12/2018  . Hard of hearing 03/12/2018  . At high risk for falls 03/12/2018  . Acute systolic CHF (congestive heart failure) ([place]) 10/29/2017  . Acute diastolic CHF (congestive heart failure) ([place]) 10/29/2017  . Urinary tract infection without hematuria   . Weakness 10/26/2017  . Acute CHF (congestive heart failure) ([place]) 10/26/2017  . Acute lower UTI 10/26/2017  . BPPV (benign paroxysmal positional vertigo) 12/10/2014  . Visual impairment 01/22/2014  . Chronic constipation 10/08/2013  . Primary osteoarthritis of both knees 10/08/2013  . Hearing deficit 05/20/2013  . Hypertension 10/30/2012  . Hyperlipemia 10/30/2012  . Vitamin D deficiency 10/30/2012  . Arthritis 10/30/2012  . Colon polyp   . Diverticulosis   . Osteoarthritis   . Edema   . Osteoporosis   . Elevated blood sugar   . Cataract     Orientation RESPIRATION BLADDER Height & Weight     Self  Normal Incontinent Weight: 118 lb 6.2 oz (53.7 kg) Height:  5\' 5"   (165.1 cm)  BEHAVIORAL SYMPTOMS/MOOD NEUROLOGICAL BOWEL NUTRITION STATUS      Incontinent Diet  AMBULATORY STATUS COMMUNICATION OF NEEDS Skin   Extensive Assist Verbally Other (Comment)(skin tear, right)                       Personal Care Assistance Level of Assistance  Bathing, Feeding, Dressing Bathing Assistance: Maximum assistance Feeding assistance: Limited assistance Dressing Assistance: Maximum assistance     Functional Limitations Info  Sight, Hearing, Speech Sight Info: Adequate Hearing Info: Adequate Speech Info: Adequate    SPECIAL CARE FACTORS FREQUENCY                       Contractures Contractures Info: Not present    Additional Factors Info  Code Status, Allergies, Psychotropic, Isolation Precautions Code Status Info: DNR Allergies Info: Alendronate Sodium, Celebrex, Dilacor, Evista, Hydrochloride, Propulsid Psychotropic Info: Buspar   Isolation Precautions Info: Airborne and contact precautions Covid+     Current Medications (02/27/2019):  This is the current hospital active medication list Current Facility-Administered Medications  Medication Dose Route Frequency Provider Last Rate Last Dose  . 0.9 %  sodium chloride infusion   Intravenous PRN Howerter, Justin B, DO 10 mL/hr at 02/27/19 0640 500 mL at 02/27/19 0640  . acetaminophen (TYLENOL) tablet 650 mg  650 mg Oral Q6H PRN Howerter, Justin B, DO       Or  . acetaminophen (TYLENOL) suppository 650 mg  650 mg Rectal Q6H PRN Howerter,  Ethelda Chick, DO      . apixaban (ELIQUIS) tablet 2.5 mg  2.5 mg Oral BID Howerter, Justin B, DO   2.5 mg at 02/27/19 0912  . atorvastatin (LIPITOR) tablet 20 mg  20 mg Oral q1800 Howerter, Justin B, DO      . calcium carbonate (TUMS - dosed in mg elemental calcium) chewable tablet 200 mg of elemental calcium  1 tablet Oral Daily Howerter, Justin B, DO   200 mg of elemental calcium at 02/27/19 0912  . carvedilol (COREG) tablet 3.125 mg  3.125 mg Oral BID WC  Howerter, Justin B, DO   3.125 mg at 02/27/19 0911  . chlorhexidine (PERIDEX) 0.12 % solution 15 mL  15 mL Mouth Rinse BID Howerter, Justin B, DO   15 mL at 02/27/19 0911  . feeding supplement (ENSURE ENLIVE) (ENSURE ENLIVE) liquid 237 mL  237 mL Oral BID BM Howerter, Justin B, DO   237 mL at 02/27/19 0912  . MEDLINE mouth rinse  15 mL Mouth Rinse q12n4p Howerter, Justin B, DO      . piperacillin-tazobactam (ZOSYN) IVPB 3.375 g  3.375 g Intravenous Q8H Tat, David, MD      . polyethylene glycol (MIRALAX / GLYCOLAX) packet 17 g  17 g Oral Daily Howerter, Justin B, DO   17 g at 02/27/19 0911  . sodium chloride flush (NS) 0.9 % injection 3 mL  3 mL Intravenous Q12H Howerter, Justin B, DO   3 mL at 02/27/19 0913     Discharge Medications: Please see discharge summary for a list of discharge medications.  Relevant Imaging Results:  Relevant Lab Results:   Additional Information SSN [id number].  Arlesia Kiel, Clydene Pugh, LCSW

## 2019-02-27 NOTE — Clinical Social Work Note (Signed)
TOC spoke with Lynnae Sandhoff, social worker at [employer] and [place], [place]. Patient initially tested positive for Covid on 12/11/2018 at which time she was transferred to their [place] facility in [place]. Patient was hospitalized while at the [facility] facility. Per Helene Kelp, family wants patient to return to [facility] and not go to [facility].  Helene Kelp states that patient is DNR, DNI and DNH.  Larene Beach stated that they have not rested patient for Covid as she is within the 90 days and their Medical Director feels that she is positive due to residual virus.   Barry Faircloth, Clydene Pugh, LCSW

## 2019-02-27 NOTE — Plan of Care (Signed)

## 2019-02-28 LAB — COMPREHENSIVE METABOLIC PANEL
ALT: 11 U/L (ref 0–44)
AST: 15 U/L (ref 15–41)
Albumin: 2.1 g/dL — ABNORMAL LOW (ref 3.5–5.0)
Alkaline Phosphatase: 76 U/L (ref 38–126)
Anion gap: 8 (ref 5–15)
BUN: 17 mg/dL (ref 8–23)
CO2: 23 mmol/L (ref 22–32)
Calcium: 7.8 mg/dL — ABNORMAL LOW (ref 8.9–10.3)
Chloride: 109 mmol/L (ref 98–111)
Creatinine, Ser: 0.48 mg/dL (ref 0.44–1.00)
GFR calc Af Amer: >60 mL/min (ref >60)
GFR calc non Af Amer: >60 mL/min (ref >60)
Glucose, Bld: 91 mg/dL (ref 70–99)
Potassium: 3.5 mmol/L (ref 3.5–5.1)
Sodium: 140 mmol/L (ref 135–145)
Total Bilirubin: 0.8 mg/dL (ref 0.3–1.2)
Total Protein: 4.4 g/dL — ABNORMAL LOW (ref 6.5–8.1)

## 2019-02-28 LAB — CBC WITH DIFFERENTIAL/PLATELET
Abs Immature Granulocytes: 0.03 10*3/uL (ref 0.00–0.07)
Basophils Absolute: 0 10*3/uL (ref 0.0–0.1)
Basophils Relative: 0 %
Eosinophils Absolute: 0.1 10*3/uL (ref 0.0–0.5)
Eosinophils Relative: 1 %
HCT: 37 % (ref 36.0–46.0)
Hemoglobin: 11.9 g/dL — ABNORMAL LOW (ref 12.0–15.0)
Immature Granulocytes: 0 %
Lymphocytes Relative: 14 %
Lymphs Abs: 1.3 10*3/uL (ref 0.7–4.0)
MCH: 32.2 pg (ref 26.0–34.0)
MCHC: 32.2 g/dL (ref 30.0–36.0)
MCV: 100.3 fL — ABNORMAL HIGH (ref 80.0–100.0)
Monocytes Absolute: 1 10*3/uL (ref 0.1–1.0)
Monocytes Relative: 11 %
Neutro Abs: 6.8 10*3/uL (ref 1.7–7.7)
Neutrophils Relative %: 74 %
Platelets: 238 10*3/uL (ref 150–400)
RBC: 3.69 MIL/uL — ABNORMAL LOW (ref 3.87–5.11)
RDW: 15.4 % (ref 11.5–15.5)
WBC: 9.3 10*3/uL (ref 4.0–10.5)
nRBC: 0 % (ref 0.0–0.2)

## 2019-02-28 MED ORDER — POTASSIUM CHLORIDE CRYS ER 20 MEQ PO TBCR
20.0000 meq | EXTENDED_RELEASE_TABLET | Freq: Once | ORAL | Status: AC
  Administered 2019-02-28: 20 meq via ORAL
  Filled 2019-02-28: qty 1

## 2019-02-28 NOTE — Progress Notes (Addendum)
PROGRESS NOTE  Marissa Burns [id number] DOB: 11-05-1914 DOA: 02/26/2019 PCP: Chipper Herb, MD (Inactive)  Brief History:  85 y.o.femalewith medical history significant foradvanced dementia, hypertension, chronic atrial fibrillation chronically anticoagulated on Eliquis, who is admitted to [facility] on 02/26/2019 with reportedly had an episode of diminished responsiveness earlier today, although the details of this are not completely clear to me,nor is it clear to me if the patient was unconscious during this episode of diminished responsiveness. The patient is unable to provide any history due to her advanced dementia.  She was also noted to be mildly hypoxic per EMS with initial oxygen saturations in the high 80s in the context of no baseline supplemental oxygen requirements. Of note, the patient was hospitalized at [facility] in December 2019 for community-acquired pneumonia after presenting for evaluation of an episode of diminished responsiveness.The patient was also noted to exhibit an episode of vomiting at the SNF earlier today,although,per SNF staff,no evidence of associated aspiration associated this.  The emergency department physician spoke with the patient's grandson/POA,who confirmed that the patient is to be DNR/DNI.He consents to conservative work-up and management,including cultures and antibiotics.   ED Course:Vital signs in the emergency department notable for the following:Temperature max 97.8; heart rate 77-87; initial blood pressure noted to be 102/54, which increased to 115/70 following interval IV fluids.    Assessment/Plan: Acute respiratory failure with hypoxia -secondary to HCAP -spoke with Dr. Malvin Johns had [place] in September 2020 -initially tested positive for COVID 12/11/18 and sent to [facility] facility in [place].  Hospitalized briefly and returned to [facility] on 01/29/19 -oxygen saturation  fluctuating between 88-93% on RA -concerned about aspiration -d/c vanco -d/c cefepime -conitnue zosyn -Follow blood cultures--neg -personally reviewed CXR--increased interstitial markings,?LLL opacity  Acute metabolic encephalopathy -Patient is awake and alert, but confused and intermittently agitated during my examination -Likely near her usual baseline at which the patient is confused, agitated, and not oriented to person, place, time, or self.  Chronic atrial fibrillation -Rate controlled -Restart Coreg -Continue apixaban  Essential hypertension -Continue carvedilol -Holding lisinopril secondary to initially soft blood pressures  Hyperlipidemia -Continue statin      Disposition Plan: SNF  11/21 if stable Family Communication:   No Family at bedside  Consultants:  none  Code Status:  DNR  DVT Prophylaxis:  apixaban   Procedures: As Listed in Progress Note Above  Antibiotics: Cefepime 11/18>>11/19 Zosyn 11/19>>>    Subjective: Pt is less agitated.  Intermittently answers questions.  Denies cp, sob, abd pain.  Remainder ROS unobtainable.  No reports of vomiting or diarrhea or uncontrolled pain  Objective: Vitals:   02/27/19 0630 02/27/19 2155 02/28/19 0700 02/28/19 1642  BP: 127/78 (!) 143/90 130/79 106/74  Pulse: 80 77 80 92  Resp: 20  20 18   Temp: 97.9 F (36.6 C)   98.6 F (37 C)  TempSrc: Oral     SpO2: 100% 97% 100%   Weight:      Height:        Intake/Output Summary (Last 24 hours) at 02/28/2019 1742 Last data filed at 02/28/2019 [id number] Gross per 24 hour  Intake 161.76 ml  Output -  Net 161.76 ml   Weight change:  Exam:   General:  Pt is alert, follows commands appropriately, not in acute distress  HEENT: No icterus, No thrush, No neck mass, [place]/AT  Cardiovascular: IRRR, S1/S2, no rubs, no gallops  Respiratory: bibasailar  rales, no wheeze  Abdomen: Soft/+BS, non tender, non distended, no guarding  Extremities: No  edema, No lymphangitis, No petechiae, No rashes, no synovitis   Data Reviewed: I have personally reviewed following labs and imaging studies Basic Metabolic Panel: Recent Labs  Lab 02/26/19 1207 02/27/19 1021 02/28/19 0540  NA 141 140 140  K 4.3 3.8 3.5  CL 106 110 109  CO2 25 19* 23  GLUCOSE 122* 118* 91  BUN 22 19 17   CREATININE 0.64 0.48 0.48  CALCIUM 8.2* 7.8* 7.8*  MG 2.1 2.0  --    Liver Function Tests: Recent Labs  Lab 02/27/19 1021 02/27/19 1046 02/28/19 0540  AST 18 17 15   ALT 11 12 11   ALKPHOS 88 86 76  BILITOT 0.9 0.9 0.8  PROT 4.7* 4.6* 4.4*  ALBUMIN 2.2* 2.2* 2.1*   No results for input(s): LIPASE, AMYLASE in the last 168 hours. No results for input(s): AMMONIA in the last 168 hours. Coagulation Profile: No results for input(s): INR, PROTIME in the last 168 hours. CBC: Recent Labs  Lab 02/26/19 1207 02/27/19 1021 02/28/19 0540  WBC 12.5* 11.5* 9.3  NEUTROABS 11.2* 10.0* 6.8  HGB 15.0 12.7 11.9*  HCT 47.8* 41.7 37.0  MCV 103.5* 105.6* 100.3*  PLT 274 212 238   Cardiac Enzymes: No results for input(s): CKTOTAL, CKMB, CKMBINDEX, TROPONINI in the last 168 hours. BNP: Invalid input(s): POCBNP CBG: No results for input(s): GLUCAP in the last 168 hours. HbA1C: No results for input(s): HGBA1C in the last 72 hours. Urine analysis:    Component Value Date/Time   COLORURINE YELLOW 02/26/2019 1140   APPEARANCEUR HAZY (A) 02/26/2019 1140   LABSPEC 1.024 02/26/2019 1140   PHURINE 5.0 02/26/2019 1140   GLUCOSEU NEGATIVE 02/26/2019 1140   HGBUR NEGATIVE 02/26/2019 1140   [place] 02/26/2019 1140   BILIRUBINUR neg 06/04/2014 1011   [place] 02/26/2019 1140   PROTEINUR NEGATIVE 02/26/2019 1140   UROBILINOGEN 0.2 12/07/2014 1322   NITRITE NEGATIVE 02/26/2019 1140   LEUKOCYTESUR NEGATIVE 02/26/2019 1140   Sepsis Labs: @LABRCNTIP (procalcitonin:4,lacticidven:4) ) Recent Results (from the past 240 hour(s))  SARS CORONAVIRUS 2  (Unnamed [place]ino 6-24 HRS) Nasopharyngeal Nasopharyngeal Swab     Status: Abnormal   Collection Time: 02/26/19  3:00 PM   Specimen: Nasopharyngeal Swab  Result Value Ref Range Status   SARS Coronavirus 2 POSITIVE (A) NEGATIVE Final    Comment: RESULT CALLED TO, READ BACK BY AND VERIFIED WITH: C THOMAS,RN 0027 02/27/2019 D BRADLEY (NOTE) SARS-CoV-2 target nucleic acids are DETECTED. The SARS-CoV-2 RNA is generally detectable in upper and lower respiratory specimens during the acute phase of infection. Positive results are indicative of active infection with SARS-CoV-2. Clinical  correlation with patient history and other diagnostic information is necessary to determine patient infection status. Positive results do  not rule out bacterial infection or co-infection with other viruses. The expected result is Negative. Fact Sheet for Patients: SugarRoll.be Fact Sheet for Healthcare Providers: https://www.woods-mathews.com/ This test is not yet approved or cleared by the [place] FDA and  has been authorized for detection and/or diagnosis of SARS-CoV-2 by FDA under an Emergency Use Authorization (EUA). This EUA will remain  in effect (meaning this test can be used) for  the duration of the COVID-19 declaration under Section 564(b)(1) of the Act, 21 U.S.C. section 360bbb-3(b)(1), unless the authorization is terminated or revoked sooner. Performed at [facility], [place] [address], [place], [place] [address]   Blood culture (routine x 2)  Status: None (Preliminary result)   Collection Time: 02/26/19  4:56 PM   Specimen: BLOOD  Result Value Ref Range Status   Specimen Description BLOOD RIGHT ANTECUBITAL  Final   Special Requests Blood Culture adequate volume AEROBIC BOTTLE ONLY  Final   Culture   Final    NO GROWTH 2 DAYS Performed at [facility], [address] [place], [place] [address]    Report Status PENDING  Incomplete  MRSA PCR  Screening     Status: None   Collection Time: 02/26/19  8:54 PM   Specimen: Nasal Mucosa; Nasopharyngeal  Result Value Ref Range Status   MRSA by PCR NEGATIVE NEGATIVE Final    Comment:        The GeneXpert MRSA Assay (FDA approved for NASAL specimens only), is one component of a comprehensive MRSA colonization surveillance program. It is not intended to diagnose MRSA infection nor to guide or monitor treatment for MRSA infections. Performed at [facility], [address] [place], [place] [address]      Scheduled Meds: . apixaban  2.5 mg Oral BID  . atorvastatin  20 mg Oral q1800  . calcium carbonate  1 tablet Oral Daily  . carvedilol  3.125 mg Oral BID WC  . chlorhexidine  15 mL Mouth Rinse BID  . feeding supplement (ENSURE ENLIVE)  237 mL Oral BID BM  . mouth rinse  15 mL Mouth Rinse q12n4p  . polyethylene glycol  17 g Oral Daily  . sodium chloride flush  3 mL Intravenous Q12H   Continuous Infusions: . sodium chloride Stopped (02/27/19 1536)  . piperacillin-tazobactam (ZOSYN)  IV 3.375 g (02/28/19 1637)    Procedures/Studies: Dg Chest Port 1 View  Result Date: 02/26/2019 CLINICAL DATA:  Unresponsive EXAM: PORTABLE CHEST 1 VIEW COMPARISON:  March 12, 2018 FINDINGS: There is consolidation in the left base with small left pleural effusion. Lungs elsewhere are clear. Heart is mildly enlarged with pulmonary vascularity normal. No adenopathy. There is aortic atherosclerosis. There is a skin fold on the right. No pneumothorax. No bone lesions. IMPRESSION: Consolidation left base with small left pleural effusion. Suspect pneumonia, although aspiration could present in this manner. Both pneumonia and aspiration may present concurrently. Lungs elsewhere clear. Heart mildly enlarged but stable. Aortic Atherosclerosis (ICD10-I70.0). Electronically Signed   By: Lowella Grip III M.D.   On: 02/26/2019 15:34    Orson Eva, DO  [facility] Pager [phone]  If 7PM-7AM,  please contact night-coverage [email or web address] Password [email or web address] 02/28/2019, 5:42 PM   LOS: 2 days

## 2019-02-28 NOTE — Clinical Social Work Note (Signed)
Patient is expected to discharge 03/01/2019 back to [facility]. Lynnae Sandhoff advised of expected discharge date and is agreeable. [facility] request that she be contacted at discharge. Shannon's contact information left in handoff.   Left Message for sister, [name], requesting return contact.  Spoke with Tillie Rung, who is unhappy with care provided at facility. They have lost patient's glasses, hearing aide, and teeth.  Family cannot call and speak with her because she has no hearing aid. When they come to the window to see her she cannot see them well. They would like for her to go to [facility]. [facility] does not take Covid + patients and this was communicated to [place]. He stated that he would talk with his great aunt, [name], and his brother but right now he wants her to return to [facility] until she tests negative then family will seek alternative placement at another facility. He stated that patient's 83 year old sister drives to see her and his brother lives almost next door to the facility. He states that her sister also lives close to [facility].  Advised that patient is expected to discharge tomorrow 03/01/2019 and he was agreeable to her return to [facility].    Tamas Suen, Clydene Pugh, LCSW

## 2019-02-28 NOTE — Progress Notes (Signed)
Initial Nutrition Assessment  DOCUMENTATION CODES:      INTERVENTION:  Ensure Enlive po TID, each supplement provides 350 kcal and 20 grams of protein -Please continue oral supplement with discharge orders. Thank you.   NUTRITION DIAGNOSIS:   Inadequate oral intake related to acute illness, chronic illness(PNA, advanced dementia) as evidenced by meal completion < 25%(patient history).   GOAL:  (Meet needs as able given pt advanced dementia and acute illness)   MONITOR: Po intake, labs and wt trends, care plan progression    REASON FOR ASSESSMENT:   Malnutrition Screening Tool    ASSESSMENT: Patient is a 85 yo female.  Hospitalized in late September acute ischemic stroke, Pneumonia. Advanced dementia at baseline. Hard of hearing. Received Hospice services between 9/30 and 10/21. Patient returned to [facility] on 10/21. She presents to ED on 11/18 due to decreased responsiveness and episode of vomiting. Chest x-ray -pneumonia and left PE. She was started on IV fluids and antibiotics in ED. Return to SNF anticipated for tomorrow per discussion with SW-unless something unforeseen occurs. Tested positive for COVID 9/2 and has been cleared to return to facility with universal PPE based on infection prevention note.  Patient is receiving a regular diet but meal intake 0% this admission. Baseline self-feeding ability unknown. Expect she is severely malnourished based on her acute and chronic illness the past 2+ months. Patient is unable to provide diet /intake history.   There is limited weight history unfortunately. Chart review shows range of 55-60 kg the past 2 years. Unable to complete nutrition focused exam at this time due to positive covid test.    Medications reviewed and include: Lipitor, TUMS, Miralax, Zosyn   Labs: BMP Latest Ref Rng & Units 02/28/2019 02/27/2019 02/26/2019  Glucose 70 - 99 mg/dL 91 118(H) 122(H)  BUN 8 - 23 mg/dL 17 19 22   Creatinine 0.44 - 1.00 mg/dL  0.48 0.48 0.64  BUN/Creat Ratio 12 - 28 - - -  Sodium 135 - 145 mmol/L 140 140 141  Potassium 3.5 - 5.1 mmol/L 3.5 3.8 4.3  Chloride 98 - 111 mmol/L 109 110 106  CO2 22 - 32 mmol/L 23 19(L) 25  Calcium 8.9 - 10.3 mg/dL 7.8(L) 7.8(L) 8.2(L)     Diet Order:   Diet Order            Diet regular Room service appropriate? Yes; Fluid consistency: Thin  Diet effective now              EDUCATION NEEDS:  Not appropriate for education at this time Skin:  Skin Assessment: Reviewed RN Assessment  Last BM:  11/19  Height:   Ht Readings from Last 1 Encounters:  02/26/19 5\' 5"  (1.651 m)    Weight:   Wt Readings from Last 1 Encounters:  02/26/19 53.7 kg    Ideal Body Weight:  57 kg  BMI:  Body mass index is 19.7 kg/m.  Estimated Nutritional Needs:   Kcal:  1375-1540  Protein:  70-75 gr  Fluid:  1400 ml daily   Colman Cater MS,RD,CSG,LDN Office: [phone] Pager: [phone]

## 2019-02-28 NOTE — Care Management Important Message (Signed)
Important Message  Patient Details  Name: Marissa Burns MRN: [id number] Date of Birth: [birth year]   Medicare Important Message Given:  Yes     Tommy Medal 02/28/2019, 3:18 PM

## 2019-03-01 ENCOUNTER — Other Ambulatory Visit: Payer: Self-pay

## 2019-03-01 LAB — BASIC METABOLIC PANEL
Anion gap: 8 (ref 5–15)
BUN: 15 mg/dL (ref 8–23)
CO2: 22 mmol/L (ref 22–32)
Calcium: 7.8 mg/dL — ABNORMAL LOW (ref 8.9–10.3)
Chloride: 111 mmol/L (ref 98–111)
Creatinine, Ser: 0.53 mg/dL (ref 0.44–1.00)
GFR calc Af Amer: >60 mL/min (ref >60)
GFR calc non Af Amer: >60 mL/min (ref >60)
Glucose, Bld: 87 mg/dL (ref 70–99)
Potassium: 3.8 mmol/L (ref 3.5–5.1)
Sodium: 141 mmol/L (ref 135–145)

## 2019-03-01 LAB — CBC
HCT: 39 % (ref 36.0–46.0)
Hemoglobin: 12 g/dL (ref 12.0–15.0)
MCH: 31.5 pg (ref 26.0–34.0)
MCHC: 30.8 g/dL (ref 30.0–36.0)
MCV: 102.4 fL — ABNORMAL HIGH (ref 80.0–100.0)
Platelets: 257 10*3/uL (ref 150–400)
RBC: 3.81 MIL/uL — ABNORMAL LOW (ref 3.87–5.11)
RDW: 15.9 % — ABNORMAL HIGH (ref 11.5–15.5)
WBC: 7.3 10*3/uL (ref 4.0–10.5)
nRBC: 0 % (ref 0.0–0.2)

## 2019-03-01 MED ORDER — AMOXICILLIN-POT CLAVULANATE 875-125 MG PO TABS
1.0000 | ORAL_TABLET | Freq: Two times a day (BID) | ORAL | Status: DC
  Administered 2019-03-01 (×2): 1 via ORAL
  Filled 2019-03-01 (×2): qty 1

## 2019-03-01 MED ORDER — AMOXICILLIN-POT CLAVULANATE 875-125 MG PO TABS: 1.0000 | ORAL_TABLET | Freq: Two times a day (BID) | ORAL | 0 refills | Status: AC

## 2019-03-01 NOTE — NC FL2 (Signed)
[place] LEVEL OF CARE SCREENING TOOL     IDENTIFICATION  Patient Name: Marissa Burns Birthdate: 16-Sep-1914 Sex: female Admission Date (Current Location): 02/26/2019  [facility] and [place] Number:  [employer] and Address:  [place] [address] [place]      Provider Number: [id number]  Attending Physician Name and Address:  Orson Eva, MD  Relative Name and Phone Number:       Current Level of Care: Hospital Recommended Level of Care: [place] Prior Approval Number:    Date Approved/Denied:   PASRR Number:    Discharge Plan: SNF    Current Diagnoses: Patient Active Problem List   Diagnosis Date Noted  . Acute metabolic encephalopathy [id number]  . Acute respiratory failure due to COVID-19 ([place]) 02/27/2019  . Altered mental status   . HCAP (healthcare-associated pneumonia) 02/26/2019  . Syncope and collapse 03/12/2018  . Leukocytosis 03/12/2018  . Dehydration 03/12/2018  . Hard of hearing 03/12/2018  . At high risk for falls 03/12/2018  . Acute systolic CHF (congestive heart failure) ([place]) 10/29/2017  . Acute diastolic CHF (congestive heart failure) ([place]) 10/29/2017  . Urinary tract infection without hematuria   . Weakness 10/26/2017  . Acute CHF (congestive heart failure) ([place]) 10/26/2017  . Acute lower UTI 10/26/2017  . BPPV (benign paroxysmal positional vertigo) 12/10/2014  . Visual impairment 01/22/2014  . Chronic constipation 10/08/2013  . Primary osteoarthritis of both knees 10/08/2013  . Hearing deficit 05/20/2013  . Hypertension 10/30/2012  . Hyperlipemia 10/30/2012  . Vitamin D deficiency 10/30/2012  . Arthritis 10/30/2012  . Colon polyp   . Diverticulosis   . Osteoarthritis   . Edema   . Osteoporosis   . Elevated blood sugar   . Cataract     Orientation RESPIRATION BLADDER Height & Weight     Self  Normal Incontinent Weight: 54.6 kg Height:  5\' 5"  (165.1 cm)   BEHAVIORAL SYMPTOMS/MOOD NEUROLOGICAL BOWEL NUTRITION STATUS      Incontinent Diet  AMBULATORY STATUS COMMUNICATION OF NEEDS Skin   Extensive Assist Verbally Other (Comment)(skin tear, right)                       Personal Care Assistance Level of Assistance  Bathing, Feeding, Dressing Bathing Assistance: Maximum assistance Feeding assistance: Limited assistance Dressing Assistance: Maximum assistance     Functional Limitations Info  Sight, Hearing, Speech Sight Info: Adequate Hearing Info: Adequate Speech Info: Adequate    SPECIAL CARE FACTORS FREQUENCY                       Contractures Contractures Info: Not present    Additional Factors Info  Code Status, Allergies, Psychotropic, Isolation Precautions Code Status Info: DNR Allergies Info: Alendronate Sodium, Celebrex, Dilacor, Evista, Hydrochloride, Propulsid Psychotropic Info: Buspar   Isolation Precautions Info: Airborne and contact precautions Covid+     Current Medications (03/01/2019):  This is the current hospital active medication list Current Facility-Administered Medications  Medication Dose Route Frequency Provider Last Rate Last Dose  . 0.9 %  sodium chloride infusion   Intravenous PRN Howerter, Justin B, DO   Stopped at 02/27/19 1536  . acetaminophen (TYLENOL) tablet 650 mg  650 mg Oral Q6H PRN Howerter, Justin B, DO   650 mg at 03/01/19 1040   Or  . acetaminophen (TYLENOL) suppository 650 mg  650 mg Rectal Q6H PRN Howerter, Justin B, DO      .  amoxicillin-clavulanate (AUGMENTIN) 875-125 MG per tablet 1 tablet  1 tablet Oral Q12H Tat, Shanon Brow, MD      . apixaban Arne [place]) tablet 2.5 mg  2.5 mg Oral BID Howerter, Justin B, DO   2.5 mg at 03/01/19 1058  . atorvastatin (LIPITOR) tablet 20 mg  20 mg Oral q1800 Howerter, Justin B, DO   20 mg at 02/28/19 1929  . calcium carbonate (TUMS - dosed in mg elemental calcium) chewable tablet 200 mg of elemental calcium  1 tablet Oral Daily Howerter, Justin B,  DO   200 mg of elemental calcium at 03/01/19 1058  . carvedilol (COREG) tablet 3.125 mg  3.125 mg Oral BID WC Howerter, Justin B, DO   3.125 mg at 03/01/19 1108  . chlorhexidine (PERIDEX) 0.12 % solution 15 mL  15 mL Mouth Rinse BID Howerter, Justin B, DO   15 mL at 03/01/19 1100  . feeding supplement (ENSURE ENLIVE) (ENSURE ENLIVE) liquid 237 mL  237 mL Oral BID BM Howerter, Justin B, DO   237 mL at 03/01/19 1058  . MEDLINE mouth rinse  15 mL Mouth Rinse q12n4p Howerter, Justin B, DO   15 mL at 03/01/19 1414  . polyethylene glycol (MIRALAX / GLYCOLAX) packet 17 g  17 g Oral Daily Howerter, Justin B, DO   17 g at 03/01/19 1058  . sodium chloride flush (NS) 0.9 % injection 3 mL  3 mL Intravenous Q12H Howerter, Justin B, DO   3 mL at 02/28/19 1637     Discharge Medications: Please see discharge summary for a list of discharge medications.  Relevant Imaging Results:  Relevant Lab Results:   Additional Information SSN [id number].  Marshell Garfinkel, RN

## 2019-03-01 NOTE — Progress Notes (Signed)
Pt has pulled tele off, IV out, purewick out and removed gown. Yelling but unable to identify why pt is yelling. Moans occasionally and then yells again. Tylenol 650mg  po given for perceived pain per dementia pain scale. Pt fed self breakfast once tray set up, ate 50%. Tolerated liquids with no difficulty. Dr. Carles Collet in to see patient, states pt is to be discharged today.

## 2019-03-01 NOTE — Progress Notes (Signed)
Patient report called to Wilmer Floor, RN at [facility]. [facility] called to notify of needed transport to [facility]. Pt's sister Milford Cage also notified of pending discharge/transfer back to [facility]. Stated understanding.

## 2019-03-01 NOTE — NC FL2 (Signed)
[place] LEVEL OF CARE SCREENING TOOL     IDENTIFICATION  Patient Name: Marissa Burns Birthdate: 21-May-1914 Sex: female Admission Date (Current Location): 02/26/2019  [facility] and [place] Number:  [employer] and Address:  [facility] [address] [place]      Provider Number: [id number]  Attending Physician Name and Address:  Orson Eva, MD  Relative Name and Phone Number:       Current Level of Care: Hospital Recommended Level of Care: [place] Prior Approval Number:    Date Approved/Denied:   PASRR Number:    Discharge Plan: SNF    Current Diagnoses: Patient Active Problem List   Diagnosis Date Noted  . Acute metabolic encephalopathy [id number]  . Acute respiratory failure due to COVID-19 ([place]) 02/27/2019  . Altered mental status   . HCAP (healthcare-associated pneumonia) 02/26/2019  . Syncope and collapse 03/12/2018  . Leukocytosis 03/12/2018  . Dehydration 03/12/2018  . Hard of hearing 03/12/2018  . At high risk for falls 03/12/2018  . Acute systolic CHF (congestive heart failure) ([place]) 10/29/2017  . Acute diastolic CHF (congestive heart failure) ([place]) 10/29/2017  . Urinary tract infection without hematuria   . Weakness 10/26/2017  . Acute CHF (congestive heart failure) ([place]) 10/26/2017  . Acute lower UTI 10/26/2017  . BPPV (benign paroxysmal positional vertigo) 12/10/2014  . Visual impairment 01/22/2014  . Chronic constipation 10/08/2013  . Primary osteoarthritis of both knees 10/08/2013  . Hearing deficit 05/20/2013  . Hypertension 10/30/2012  . Hyperlipemia 10/30/2012  . Vitamin D deficiency 10/30/2012  . Arthritis 10/30/2012  . Colon polyp   . Diverticulosis   . Osteoarthritis   . Edema   . Osteoporosis   . Elevated blood sugar   . Cataract     Orientation RESPIRATION BLADDER Height & Weight     Self  Normal Incontinent Weight: 120 lb 5.9 oz (54.6 kg) Height:  5\' 5"   (165.1 cm)  BEHAVIORAL SYMPTOMS/MOOD NEUROLOGICAL BOWEL NUTRITION STATUS      Incontinent Diet  AMBULATORY STATUS COMMUNICATION OF NEEDS Skin   Extensive Assist Verbally Other (Comment)(skin tear, right)                       Personal Care Assistance Level of Assistance  Bathing, Feeding, Dressing Bathing Assistance: Maximum assistance Feeding assistance: Limited assistance Dressing Assistance: Maximum assistance     Functional Limitations Info  Sight, Hearing, Speech Sight Info: Adequate Hearing Info: Adequate Speech Info: Adequate    SPECIAL CARE FACTORS FREQUENCY                       Contractures Contractures Info: Not present    Additional Factors Info  Code Status, Allergies, Psychotropic, Isolation Precautions Code Status Info: DNR Allergies Info: Alendronate Sodium, Celebrex, Dilacor, Evista, Hydrochloride, Propulsid Psychotropic Info: Buspar   Isolation Precautions Info: Airborne and contact precautions Covid+     Current Medications (03/01/2019):  This is the current hospital active medication list Current Facility-Administered Medications  Medication Dose Route Frequency Provider Last Rate Last Dose  . 0.9 %  sodium chloride infusion   Intravenous PRN Howerter, Justin B, DO   Stopped at 02/27/19 1536  . acetaminophen (TYLENOL) tablet 650 mg  650 mg Oral Q6H PRN Howerter, Justin B, DO   650 mg at 03/01/19 1040   Or  . acetaminophen (TYLENOL) suppository 650 mg  650 mg Rectal Q6H PRN Howerter, Larkin Ina  B, DO      . amoxicillin-clavulanate (AUGMENTIN) 875-125 MG per tablet 1 tablet  1 tablet Oral Q12H Tat, Shanon Brow, MD      . apixaban Arne Cleveland) tablet 2.5 mg  2.5 mg Oral BID Howerter, Justin B, DO   2.5 mg at 03/01/19 1058  . atorvastatin (LIPITOR) tablet 20 mg  20 mg Oral q1800 Howerter, Justin B, DO   20 mg at 02/28/19 1929  . calcium carbonate (TUMS - dosed in mg elemental calcium) chewable tablet 200 mg of elemental calcium  1 tablet Oral Daily  Howerter, Justin B, DO   200 mg of elemental calcium at 03/01/19 1058  . carvedilol (COREG) tablet 3.125 mg  3.125 mg Oral BID WC Howerter, Justin B, DO   3.125 mg at 03/01/19 1108  . chlorhexidine (PERIDEX) 0.12 % solution 15 mL  15 mL Mouth Rinse BID Howerter, Justin B, DO   15 mL at 03/01/19 1100  . feeding supplement (ENSURE ENLIVE) (ENSURE ENLIVE) liquid 237 mL  237 mL Oral BID BM Howerter, Justin B, DO   237 mL at 03/01/19 1058  . MEDLINE mouth rinse  15 mL Mouth Rinse q12n4p Howerter, Justin B, DO   15 mL at 02/28/19 1638  . polyethylene glycol (MIRALAX / GLYCOLAX) packet 17 g  17 g Oral Daily Howerter, Justin B, DO   17 g at 03/01/19 1058  . sodium chloride flush (NS) 0.9 % injection 3 mL  3 mL Intravenous Q12H Howerter, Justin B, DO   3 mL at 02/28/19 1637     Discharge Medications: Please see discharge summary for a list of discharge medications.  Relevant Imaging Results:  Relevant Lab Results:   Additional Information SSN [id number].  Truitt Merle, LCSW

## 2019-03-01 NOTE — Progress Notes (Signed)
Pt transported to [facility] via EMS

## 2019-03-01 NOTE — TOC Transition Note (Signed)
Transition of Care [facility]) - CM/SW Discharge Note   Patient Details  Name: Marissa Burns MRN: [id number] Date of Birth: 1915-02-23  [facility] CM/SW Contact:  Truitt Merle, LCSW Phone Number: 03/01/2019, 4:08 PM   Clinical Narrative:    Patient ready for discharge today back to [facility] ([place]). Spoke with Lynnae Sandhoff, Admissions at [facility] [phone]) to confirm patient's return. Ms. Lupita Leash agreeable and asked that D/C summary and updated FL-2 (COVID+) be sent with patient. CSW updated PCP, RN, and sister-I[employer]. RN to arrange EMS transport for arrival at Rm. 602A. Signed FL-2 and D/C documents faxed to [facility] at [phone].   Final next level of care: Skilled Nursing Facility Barriers to Discharge: No Barriers Identified   Patient Goals and CMS Choice   CMS Medicare.gov Compare Post Acute Care list provided to:: Other (Comment Required)(Returning to previous facility) Choice offered to / list presented to : NA  Discharge Placement              Patient chooses bed at: [facility](Return) Patient to be transferred to facility by: [facility] Name of family member notified: Roque Cash Patient and family notified of of transfer: 03/01/19  Discharge Plan and Services                                     Social Determinants of Health (SDOH) Interventions     Readmission Risk Interventions No flowsheet data found.

## 2019-03-01 NOTE — Discharge Summary (Signed)
Physician Discharge Summary  Marissa Burns [id number] DOB: 09/03/1914 DOA: 02/26/2019  PCP: Chipper Herb, MD (Inactive)  Admit date: 02/26/2019 Discharge date: 03/01/2019  Admitted From: SNF Disposition:  SNF  Recommendations for Outpatient Follow-up:  1. Follow up with PCP in 1-2 weeks 2. Please obtain BMP/CBC in one week    Discharge Condition: Stable CODE STATUS: DNR Diet recommendation: regular/dysphagia 3   Brief/Interim Summary: 85 y.o.femalewith medical history significant foradvanced dementia, hypertension, chronic atrial fibrillation chronically anticoagulated on Eliquis, who is [facility] to [facility] on 11/18/2020withreportedly had an episode of diminished responsiveness earlier today, although the details of this are not completely clear to me,nor is it clear to me if the patient was unconscious during this episode of diminished responsiveness.The patient is unable to provide any history due to her advanced dementia.She was also noted to be mildly hypoxic per EMS with initial oxygen saturations in the high 80s in the context of no baseline supplemental oxygen requirements. Of note, the patient was hospitalized at [facility] in December 2019 for community-acquired pneumonia after presenting for evaluation of an episode of diminished responsiveness.The patient was also noted to exhibit an episode of vomiting at the SNF earlier today,although,per SNF staff,no evidence of associated aspiration associated this.  The emergency department physician spoke with the patient's grandson/POA,who confirmed that the patient is to be DNR/DNI.He consents to conservative work-up and management,including cultures and antibiotics.   ED Course:Vital signs in the emergency department notable for the following:Temperature max 97.8; heart rate 77-87; initial blood pressure noted to be 102/54, which increased to 115/70 following interval IV  fluids.  Discharge Diagnoses:  Acute respiratory failure with hypoxia -secondary to HCAP -spoke with Dr. Malvin Johns had [place] in September 2020 -initially tested positive for COVID 12/11/18 and sent to [facility] facility in [place].  Hospitalized briefly and returned to [place] on 01/29/19 -oxygen saturation fluctuating between 88-93% on RA initially -oxygen saturation 97-98% on day of d/c -concerned about aspiration -d/c vanco -d/c cefepime -started on zosyn>>>d/c with amox/clav x 4 more days to complete on week -Follow blood cultures--neg -personally reviewed CXR--increased interstitial markings,?LLL opacity  Acute metabolic encephalopathy -Patient is awake and alert, but confused and intermittently agitated during my examination initllay -on day of d/c pt was much more alert and less agitated and followed commands albeit remained pleasantly confused--she was able to feed herself -Likely near her usual baselineat which the patient is confused, agitated, and not oriented to person, place, time, or self.  Chronic atrial fibrillation -Rate controlled -Restart Coreg -Continue apixaban  Essential hypertension -Continue carvedilol -Holding lisinopril secondary to initially soft blood pressures>>restart after d/c  Hyperlipidemia -Continue statin  Discharge Instructions   Allergies as of 03/01/2019      Reactions   Alendronate Sodium Other (See Comments)   unknown   Celebrex [celecoxib] Other (See Comments)   unknown   Dilacor [diltiazem Hcl] Other (See Comments)   unknown   Evista [raloxifene Hydrochloride] Other (See Comments)   unknown   Propulsid [cisapride] Other (See Comments)   unknown      Medication List    TAKE these medications   acetaminophen 500 MG tablet Commonly known as: TYLENOL Take 1,000 mg by mouth daily as needed for moderate pain.   amoxicillin-clavulanate 875-125 MG tablet Commonly known as: AUGMENTIN Take 1 tablet by  mouth every 12 (twelve) hours. X 4 days   apixaban 2.5 MG Tabs tablet Commonly known as: ELIQUIS Take 1 tablet (2.5 mg total)  by mouth 2 (two) times daily.   atorvastatin 80 MG tablet Commonly known as: LIPITOR TAKE 1 TABLET (80 MG TOTAL) BY MOUTH DAILY. What changed: how much to take   calcium carbonate 500 MG chewable tablet Commonly known as: TUMS - dosed in mg elemental calcium Chew 1 tablet by mouth daily.   carvedilol 3.125 MG tablet Commonly known as: COREG TAKE 1 TABLET (3.125 MG TOTAL) BY MOUTH 2 (TWO) TIMES DAILY WITH A MEAL.   furosemide 20 MG tablet Commonly known as: LASIX Take 0.5 tablets (10 mg total) by mouth every other day. What changed:   how much to take  when to take this   lisinopril 5 MG tablet Commonly known as: ZESTRIL Take 1 tablet (5 mg total) by mouth daily.   polyethylene glycol 17 g packet Commonly known as: MIRALAX / GLYCOLAX Take 17 g by mouth daily.       Allergies  Allergen Reactions   Alendronate Sodium Other (See Comments)    unknown   Celebrex [Celecoxib] Other (See Comments)    unknown   Dilacor [Diltiazem Hcl] Other (See Comments)    unknown   Evista [Raloxifene Hydrochloride] Other (See Comments)    unknown   Propulsid [Cisapride] Other (See Comments)    unknown    Consultations:  none   Procedures/Studies: Dg Chest Port 1 View  Result Date: 02/26/2019 CLINICAL DATA:  Unresponsive EXAM: PORTABLE CHEST 1 VIEW COMPARISON:  March 12, 2018 FINDINGS: There is consolidation in the left base with small left pleural effusion. Lungs elsewhere are clear. Heart is mildly enlarged with pulmonary vascularity normal. No adenopathy. There is aortic atherosclerosis. There is a skin fold on the right. No pneumothorax. No bone lesions. IMPRESSION: Consolidation left base with small left pleural effusion. Suspect pneumonia, although aspiration could present in this manner. Both pneumonia and aspiration may present concurrently.  Lungs elsewhere clear. Heart mildly enlarged but stable. Aortic Atherosclerosis (ICD10-I70.0). Electronically Signed   By: Lowella Grip III M.D.   On: 02/26/2019 15:34         Discharge Exam: Vitals:   02/28/19 2140 03/01/19 0646  BP: 117/79 (!) 141/83  Pulse: 72 77  Resp:    Temp: 98.1 F (36.7 C) 98.1 F (36.7 C)  SpO2: 97% 98%   Vitals:   02/28/19 1642 02/28/19 2140 03/01/19 0646 03/01/19 0700  BP: 106/74 117/79 (!) 141/83   Pulse: 92 72 77   Resp: 18     Temp: 98.6 F (37 C) 98.1 F (36.7 C) 98.1 F (36.7 C)   TempSrc:  Oral Oral   SpO2:  97% 98%   Weight:    54.6 kg  Height:        General: Pt is alert, awake, not in acute distress Cardiovascular: IRRR, S1/S2 +, no rubs, no gallops Respiratory: bibasilar rales. No wheeze Abdominal: Soft, NT, ND, bowel sounds + Extremities: no edema, no cyanosis   The results of significant diagnostics from this hospitalization (including imaging, microbiology, ancillary and laboratory) are listed below for reference.    Significant Diagnostic Studies: Dg Chest Port 1 View  Result Date: 02/26/2019 CLINICAL DATA:  Unresponsive EXAM: PORTABLE CHEST 1 VIEW COMPARISON:  March 12, 2018 FINDINGS: There is consolidation in the left base with small left pleural effusion. Lungs elsewhere are clear. Heart is mildly enlarged with pulmonary vascularity normal. No adenopathy. There is aortic atherosclerosis. There is a skin fold on the right. No pneumothorax. No bone lesions. IMPRESSION: Consolidation left base with small  left pleural effusion. Suspect pneumonia, although aspiration could present in this manner. Both pneumonia and aspiration may present concurrently. Lungs elsewhere clear. Heart mildly enlarged but stable. Aortic Atherosclerosis (ICD10-I70.0). Electronically Signed   By: Lowella Grip III M.D.   On: 02/26/2019 15:34     Microbiology: Recent Results (from the past 240 hour(s))  SARS CORONAVIRUS 2 (Johnetta Sloniker 6-24 HRS)  Nasopharyngeal Nasopharyngeal Swab     Status: Abnormal   Collection Time: 02/26/19  3:00 PM   Specimen: Nasopharyngeal Swab  Result Value Ref Range Status   SARS Coronavirus 2 POSITIVE (A) NEGATIVE Final    Comment: RESULT CALLED TO, READ BACK BY AND VERIFIED WITH: C THOMAS,RN 0027 02/27/2019 D BRADLEY (NOTE) SARS-CoV-2 target nucleic acids are DETECTED. The SARS-CoV-2 RNA is generally detectable in upper and lower respiratory specimens during the acute phase of infection. Positive results are indicative of active infection with SARS-CoV-2. Clinical  correlation with patient history and other diagnostic information is necessary to determine patient infection status. Positive results do  not rule out bacterial infection or co-infection with other viruses. The expected result is Negative. Fact Sheet for Patients: SugarRoll.be Fact Sheet for Healthcare Providers: https://www.woods-mathews.com/ This test is not yet approved or cleared by the [place] FDA and  has been authorized for detection and/or diagnosis of SARS-CoV-2 by FDA under an Emergency Use Authorization (EUA). This EUA will remain  in effect (meaning this test can be used) for  the duration of the COVID-19 declaration under Section 564(b)(1) of the Act, 21 U.S.C. section 360bbb-3(b)(1), unless the authorization is terminated or revoked sooner. Performed at [facility], [place] [address], [place], [place] [address]   Blood culture (routine x 2)     Status: None (Preliminary result)   Collection Time: 02/26/19  4:56 PM   Specimen: BLOOD  Result Value Ref Range Status   Specimen Description BLOOD RIGHT ANTECUBITAL  Final   Special Requests Blood Culture adequate volume AEROBIC BOTTLE ONLY  Final   Culture   Final    NO GROWTH 3 DAYS Performed at [facility], [address] [place], [place] [address]    Report Status PENDING  Incomplete  MRSA PCR Screening     Status:  None   Collection Time: 02/26/19  8:54 PM   Specimen: Nasal Mucosa; Nasopharyngeal  Result Value Ref Range Status   MRSA by PCR NEGATIVE NEGATIVE Final    Comment:        The GeneXpert MRSA Assay (FDA approved for NASAL specimens only), is one component of a comprehensive MRSA colonization surveillance program. It is not intended to diagnose MRSA infection nor to guide or monitor treatment for MRSA infections. Performed at [facility], [address] [place], [place] [address]      Labs: Basic Metabolic Panel: Recent Labs  Lab 02/26/19 1207 02/27/19 1021 02/28/19 0540 03/01/19 0659  NA 141 140 140 141  K 4.3 3.8 3.5 3.8  CL 106 110 109 111  CO2 25 19* 23 22  GLUCOSE 122* 118* 91 87  BUN 22 19 17 15   CREATININE 0.64 0.48 0.48 0.53  CALCIUM 8.2* 7.8* 7.8* 7.8*  MG 2.1 2.0  --   --    Liver Function Tests: Recent Labs  Lab 02/27/19 1021 02/27/19 1046 02/28/19 0540  AST 18 17 15   ALT 11 12 11   ALKPHOS 88 86 76  BILITOT 0.9 0.9 0.8  PROT 4.7* 4.6* 4.4*  ALBUMIN 2.2* 2.2* 2.1*   No results for  input(s): LIPASE, AMYLASE in the last 168 hours. No results for input(s): AMMONIA in the last 168 hours. CBC: Recent Labs  Lab 02/26/19 1207 02/27/19 1021 02/28/19 0540 03/01/19 0659  WBC 12.5* 11.5* 9.3 7.3  NEUTROABS 11.2* 10.0* 6.8  --   HGB 15.0 12.7 11.9* 12.0  HCT 47.8* 41.7 37.0 39.0  MCV 103.5* 105.6* 100.3* 102.4*  PLT 274 212 238 257   Cardiac Enzymes: No results for input(s): CKTOTAL, CKMB, CKMBINDEX, TROPONINI in the last 168 hours. BNP: Invalid input(s): POCBNP CBG: No results for input(s): GLUCAP in the last 168 hours.  Time coordinating discharge:  36 minutes  Signed:  Orson Eva, DO [facility] Pager: [phone] 03/01/2019, 10:50 AM

## 2019-03-03 LAB — CULTURE, BLOOD (ROUTINE X 2)
Culture: NO GROWTH
Special Requests: ADEQUATE

## 2019-03-08 DIAGNOSIS — Z79899 Other long term (current) drug therapy: Secondary | ICD-10-CM | POA: Diagnosis not present

## 2019-03-12 ENCOUNTER — Other Ambulatory Visit: Payer: Self-pay

## 2019-03-12 ENCOUNTER — Emergency Department (HOSPITAL_COMMUNITY): Payer: Medicare Other

## 2019-03-12 ENCOUNTER — Emergency Department (HOSPITAL_COMMUNITY)
Admission: EM | Admit: 2019-03-12 | Discharge: 2019-03-12 | Disposition: A | Discharge status: Skilled Nursing Facility | Payer: Medicare Other | Attending: Emergency Medicine | Admitting: Emergency Medicine

## 2019-03-12 ENCOUNTER — Encounter (HOSPITAL_COMMUNITY): Payer: Self-pay

## 2019-03-12 DIAGNOSIS — W050XXA Fall from non-moving wheelchair, initial encounter: Secondary | ICD-10-CM | POA: Insufficient documentation

## 2019-03-12 DIAGNOSIS — I130 Hypertensive heart and chronic kidney disease with heart failure and stage 1 through stage 4 chronic kidney disease, or unspecified chronic kidney disease: Secondary | ICD-10-CM | POA: Insufficient documentation

## 2019-03-12 DIAGNOSIS — N182 Chronic kidney disease, stage 2 (mild): Secondary | ICD-10-CM | POA: Diagnosis not present

## 2019-03-12 DIAGNOSIS — Y92128 Other place in nursing home as the place of occurrence of the external cause: Secondary | ICD-10-CM | POA: Diagnosis not present

## 2019-03-12 DIAGNOSIS — R10819 Abdominal tenderness, unspecified site: Secondary | ICD-10-CM | POA: Insufficient documentation

## 2019-03-12 DIAGNOSIS — W19XXXA Unspecified fall, initial encounter: Secondary | ICD-10-CM

## 2019-03-12 DIAGNOSIS — F0390 Unspecified dementia without behavioral disturbance: Secondary | ICD-10-CM | POA: Insufficient documentation

## 2019-03-12 DIAGNOSIS — S0990XA Unspecified injury of head, initial encounter: Secondary | ICD-10-CM | POA: Diagnosis present

## 2019-03-12 DIAGNOSIS — Y999 Unspecified external cause status: Secondary | ICD-10-CM | POA: Insufficient documentation

## 2019-03-12 DIAGNOSIS — Z79899 Other long term (current) drug therapy: Secondary | ICD-10-CM | POA: Diagnosis not present

## 2019-03-12 DIAGNOSIS — Y9389 Activity, other specified: Secondary | ICD-10-CM | POA: Diagnosis not present

## 2019-03-12 DIAGNOSIS — Z8619 Personal history of other infectious and parasitic diseases: Secondary | ICD-10-CM | POA: Diagnosis not present

## 2019-03-12 DIAGNOSIS — Z7901 Long term (current) use of anticoagulants: Secondary | ICD-10-CM | POA: Insufficient documentation

## 2019-03-12 DIAGNOSIS — I509 Heart failure, unspecified: Secondary | ICD-10-CM | POA: Diagnosis not present

## 2019-03-12 DIAGNOSIS — S0181XA Laceration without foreign body of other part of head, initial encounter: Secondary | ICD-10-CM | POA: Diagnosis not present

## 2019-03-12 HISTORY — DX: Atherosclerosis of aorta: I70.0

## 2019-03-12 HISTORY — DX: Malignant (primary) neoplasm, unspecified: C80.1

## 2019-03-12 HISTORY — DX: Chronic kidney disease, stage 2 (mild): N18.2

## 2019-03-12 HISTORY — DX: Unspecified atrial fibrillation: I48.91

## 2019-03-12 HISTORY — DX: Peripheral vascular disease, unspecified: I73.9

## 2019-03-12 HISTORY — DX: Pneumonia, unspecified organism: J18.9

## 2019-03-12 HISTORY — DX: Neoplasm of unspecified behavior of endocrine glands and other parts of nervous system: D49.7

## 2019-03-12 HISTORY — DX: Repeated falls: R29.6

## 2019-03-12 HISTORY — DX: Heart failure, unspecified: I50.9

## 2019-03-12 HISTORY — DX: Left ventricular failure, unspecified: I50.1

## 2019-03-12 MED ORDER — LIDOCAINE-EPINEPHRINE (PF) 1 %-1:200000 IJ SOLN
20.0000 mL | Freq: Once | INTRAMUSCULAR | Status: AC
  Administered 2019-03-12: 20 mL
  Filled 2019-03-12: qty 30

## 2019-03-12 MED ORDER — DOUBLE ANTIBIOTIC 500-10000 UNIT/GM EX OINT
TOPICAL_OINTMENT | Freq: Once | CUTANEOUS | Status: AC
  Administered 2019-03-12: 1 via TOPICAL
  Filled 2019-03-12: qty 1

## 2019-03-12 MED ORDER — POVIDONE-IODINE 10 % EX SOLN
CUTANEOUS | Status: AC
  Filled 2019-03-12: qty 15

## 2019-03-12 MED ORDER — CEPHALEXIN 500 MG PO CAPS
500.0000 mg | ORAL_CAPSULE | Freq: Once | ORAL | Status: AC
  Administered 2019-03-12: 500 mg via ORAL
  Filled 2019-03-12: qty 1

## 2019-03-12 MED ORDER — CEPHALEXIN 500 MG PO CAPS: 500.0000 mg | ORAL_CAPSULE | Freq: Two times a day (BID) | ORAL | 0 refills | Status: AC

## 2019-03-12 NOTE — ED Provider Notes (Signed)
..Laceration Repair  Date/Time: 03/12/2019 6:59 PM Performed by: Etter Sjogren, PA-C Authorized by: Etter Sjogren, PA-C   Consent:    Consent obtained:  Verbal   Consent given by:  Patient   Risks discussed:  Infection, need for additional repair, pain, poor cosmetic result and poor wound healing   Alternatives discussed:  No treatment and delayed treatment Universal protocol:    Procedure explained and questions answered to patient or proxy's satisfaction: yes     Relevant documents present and verified: yes     Test results available and properly labeled: yes     Imaging studies available: yes     Required blood products, implants, devices, and special equipment available: yes     Site/side marked: yes     Immediately prior to procedure, a time out was called: yes     Patient identity confirmed:  Verbally with patient Anesthesia (see MAR for exact dosages):    Anesthesia method:  Local infiltration   Local anesthetic:  Lidocaine 1% WITH epi Laceration details:    Location:  Scalp   Scalp location:  Frontal   Length (cm):  4   Depth (mm):  5 Repair type:    Repair type:  Simple Pre-procedure details:    Preparation:  Imaging obtained to evaluate for foreign bodies Exploration:    Hemostasis achieved with:  Epinephrine and direct pressure   Wound exploration: entire depth of wound probed and visualized     Wound extent: no vascular damage noted     Contaminated: no   Treatment:    Area cleansed with:  Betadine and saline   Amount of cleaning:  Extensive   Irrigation solution:  Sterile saline   Irrigation volume:  500   Irrigation method:  Syringe   Visualized foreign bodies/material removed: no   Skin repair:    Repair method:  Sutures   Suture size:  4-0   Suture material:  Nylon   Suture technique:  Simple interrupted   Number of sutures:  5 Approximation:    Approximation:  Close Post-procedure details:    Dressing:  Antibiotic ointment and  non-adherent dressing   Patient tolerance of procedure:  Tolerated well, no immediate complications .Marland KitchenLaceration Repair  Date/Time: 03/12/2019 7:01 PM Performed by: Etter Sjogren, PA-C Authorized by: Etter Sjogren, PA-C   Consent:    Consent obtained:  Verbal   Consent given by:  Patient   Risks discussed:  Infection, need for additional repair, pain, poor cosmetic result and poor wound healing   Alternatives discussed:  No treatment and delayed treatment Universal protocol:    Procedure explained and questions answered to patient or proxy's satisfaction: yes     Relevant documents present and verified: yes     Test results available and properly labeled: yes     Imaging studies available: yes     Required blood products, implants, devices, and special equipment available: yes     Site/side marked: yes     Immediately prior to procedure, a time out was called: yes     Patient identity confirmed:  Verbally with patient Anesthesia (see MAR for exact dosages):    Anesthesia method:  Local infiltration   Local anesthetic:  Lidocaine 1% WITH epi Laceration details:    Location:  Face   Face location:  Nose   Length (cm):  3   Depth (mm):  5 Repair type:    Repair type:  Simple Pre-procedure details:    Preparation:  Imaging obtained to evaluate for foreign bodies Exploration:    Hemostasis achieved with:  Epinephrine and direct pressure   Wound extent: no vascular damage noted     Contaminated: no   Treatment:    Area cleansed with:  Betadine and saline   Amount of cleaning:  Extensive   Irrigation solution:  Sterile saline   Irrigation volume:  200   Irrigation method:  Syringe   Visualized foreign bodies/material removed: no   Skin repair:    Repair method:  Sutures   Suture size:  5-0   Suture material:  Nylon   Suture technique:  Simple interrupted   Number of sutures:  3 Approximation:    Approximation:  Close Post-procedure details:    Dressing:   Antibiotic ointment and non-adherent dressing   Patient tolerance of procedure:  Tolerated well, no immediate complications     Etter Sjogren, PA-C 03/13/19 0108    Ezequiel Essex, MD 03/13/19 [phone]

## 2019-03-12 NOTE — Discharge Instructions (Signed)
Follow-up for suture removal in 1 week.  There is a small fracture of the tip of your nose that should not need any kind of repair.  Take the antibiotics as prescribed and follow-up with your primary doctor as well as the ENT doctor. Return to the ED with new or worsening symptoms.

## 2019-03-12 NOTE — ED Notes (Signed)
Pt has laceration to forehead and across bridge of nose . Pt continually saying Oh...

## 2019-03-12 NOTE — ED Triage Notes (Signed)
Pt resident of [facility].  EMS says pt fell from her wheelchair, face first.  Reports struck bridge of nose and has laceration to forehead.  Facility wrapped wounds.  Pt moaning.  EMS says pt was covid positive 90 days ago and tested negative yesterday.

## 2019-03-12 NOTE — ED Provider Notes (Signed)
[facility] EMERGENCY DEPARTMENT Provider Note   CSN: [id number] Arrival date & time: 03/12/19  1702     History   Chief Complaint Chief Complaint  Patient presents with   Fall    HPI Marissa Burns is a 85 y.o. female.     Level 5 caveat for dementia.  Patient here from nursing facility after falling.  She apparently fell face forward from her wheelchair striking her face.  She has a laceration to her forehead and bridge of nose.  Patient unable to give any history.  She is moaning and apparently at her baseline.  She does take Eliquis for history of atrial fibrillation.  She is unable to state whether anything hurts.  She is moving all of her extremities.  She apparently had Covid test was positive several months ago but has since been tested negative.  The history is provided by the patient.  Fall    Past Medical History:  Diagnosis Date   Anxiety    Anxiety    Atherosclerosis of aorta (HCC)    Atrial fibrillation (HCC)    Cancer (HCC)    Cataract    CHF (congestive heart failure) (HCC)    CKD (chronic kidney disease), stage II    Colon polyp    Dementia (HCC)    Diverticulosis    Edema    Elevated blood sugar    Essential hypertension, benign    Falls frequently    High cholesterol    Hyperlipidemia    Left ventricular failure (HCC)    Melanocytic lesion of central nervous system    Osteoarthritis    Osteoporosis    Other and unspecified hyperlipidemia    Peripheral vascular disease (HCC)    Pneumonia    PVD (peripheral vascular disease) ([place])    Renal disorder    Secondary hyperaldosteronism ([place])    Thrombophilia ([place])    Vitamin D deficiency     Patient Active Problem List   Diagnosis Date Noted   Acute metabolic encephalopathy [id number]   Acute respiratory failure due to COVID-19 ([place]) 02/27/2019   Altered mental status    HCAP (healthcare-associated pneumonia) 02/26/2019   Syncope and collapse 03/12/2018    Leukocytosis 03/12/2018   Dehydration 03/12/2018   Hard of hearing 03/12/2018   At high risk for falls [id number]   Acute systolic CHF (congestive heart failure) ([place]) [id number]   Acute diastolic CHF (congestive heart failure) ([place]) 10/29/2017   Urinary tract infection without hematuria    Weakness 10/26/2017   Acute CHF (congestive heart failure) ([place]) 10/26/2017   Acute lower UTI 10/26/2017   BPPV (benign paroxysmal positional vertigo) 12/10/2014   Visual impairment 01/22/2014   Chronic constipation 10/08/2013   Primary osteoarthritis of both knees 10/08/2013   Hearing deficit 05/20/2013   Hypertension 10/30/2012   Hyperlipemia 10/30/2012   Vitamin D deficiency 10/30/2012   Arthritis 10/30/2012   Colon polyp    Diverticulosis    Osteoarthritis    Edema    Osteoporosis    Elevated blood sugar    Cataract     Past Surgical History:  Procedure Laterality Date   APPENDECTOMY     CATARACT EXTRACTION     TONSILLECTOMY       OB History   No obstetric history on file.      Home Medications    Prior to Admission medications   Medication Sig Start Date End Date Taking? Authorizing Provider  acetaminophen (TYLENOL) 500 MG tablet Take 1,000 mg by  mouth daily as needed for moderate pain.     [provider]  amoxicillin-clavulanate (AUGMENTIN) 875-125 MG tablet Take 1 tablet by mouth every 12 (twelve) hours. X 4 days 03/01/19   Orson Eva, MD  apixaban (ELIQUIS) 2.5 MG TABS tablet Take 1 tablet (2.5 mg total) by mouth 2 (two) times daily. 12/26/17   Chipper Herb, MD  atorvastatin (LIPITOR) 80 MG tablet TAKE 1 TABLET (80 MG TOTAL) BY MOUTH DAILY. Patient taking differently: Take 20 mg by mouth daily.  01/10/18   Chipper Herb, MD  calcium carbonate (TUMS - DOSED IN MG ELEMENTAL CALCIUM) 500 MG chewable tablet Chew 1 tablet by mouth daily.    [provider]  carvedilol (COREG) 3.125 MG tablet TAKE 1 TABLET (3.125 MG TOTAL)  BY MOUTH 2 (TWO) TIMES DAILY WITH A MEAL. 03/19/18   Chipper Herb, MD  furosemide (LASIX) 20 MG tablet Take 0.5 tablets (10 mg total) by mouth every other day. Patient taking differently: Take 20 mg by mouth daily.  03/18/18   Johnson, Clanford L, MD  lisinopril (PRINIVIL,ZESTRIL) 5 MG tablet Take 1 tablet (5 mg total) by mouth daily. 12/27/17   Chipper Herb, MD  polyethylene glycol [facility] / Floria Raveling) packet Take 17 g by mouth daily.    [provider]    Family History No family history on file.  Social History Social History   Tobacco Use   Smoking status: Never Smoker   Smokeless tobacco: Never Used  Substance Use Topics   Alcohol use: No   Drug use: No     Allergies   Alendronate sodium, Celebrex [celecoxib], Dilacor [diltiazem hcl], Evista [raloxifene hydrochloride], and Propulsid [cisapride]   Review of Systems Review of Systems  Unable to perform ROS: Dementia     Physical Exam Updated Vital Signs BP (!) 146/80 (BP Location: Left Arm)    Pulse 94    Temp 97.8 F (36.6 C) (Oral)    Resp 20    Ht 5\' 5"  (1.651 m)    Wt 54 kg    SpO2 94%    BMI 19.81 kg/m   Physical Exam Constitutional:      General: She is not in acute distress.    Appearance: Normal appearance. She is normal weight. She is not ill-appearing.     Comments: Moaning, does not answer questions  HENT:     Head:     Comments: 3 cm transverse laceration to anterior forehead  1 cm transverse laceration to bridge of nose.  No septal hematoma or hemotympanum    Nose: Nose normal.     Mouth/Throat:     Mouth: Mucous membranes are moist.  Eyes:     Extraocular Movements: Extraocular movements intact.     Conjunctiva/sclera: Conjunctivae normal.     Pupils: Pupils are equal, round, and reactive to light.  Neck:     Comments: No C-spine tenderness or step-off Cardiovascular:     Rate and Rhythm: Normal rate.     Pulses: Normal pulses.  Chest:     Chest wall: No tenderness.    Abdominal:     Tenderness: There is abdominal tenderness. There is no guarding or rebound.  Musculoskeletal:        General: Signs of injury present.     Comments: No T or L-spine tenderness Full range of motion of bilateral hips and knees.  Slight ecchymosis to right knee.  Neurological:     Mental Status: She is alert.  Comments: Moves all extremities, does not follow commands or answer questions      ED Treatments / Results  Labs (all labs ordered are listed, but only abnormal results are displayed) Labs Reviewed - No data to display  EKG None  Radiology Ct Head Wo Contrast  Result Date: 03/12/2019 CLINICAL DATA:  Head and face trauma after falling from a wheelchair. EXAM: CT HEAD WITHOUT CONTRAST CT MAXILLOFACIAL WITHOUT CONTRAST CT CERVICAL SPINE WITHOUT CONTRAST TECHNIQUE: Multidetector CT imaging of the head, cervical spine, and maxillofacial structures were performed using the standard protocol without intravenous contrast. Multiplanar CT image reconstructions of the cervical spine and maxillofacial structures were also generated. COMPARISON:  CT scans dated 11/12/2017 FINDINGS: CT HEAD FINDINGS Brain: There is diffuse cerebral cortical atrophy with secondary ventricular dilatation. There has been marked progression of the periventricular white matter disease since the prior study with multiple interval lacunar infarcts. Increased white matter disease in the occipital lobes, left greater than right. No acute hemorrhage or discrete acute infarction. No mass lesions. Vascular: No hyperdense vessel or unexpected calcification. Skull: No skull fractures. Other: There is a new impacted fracture of nasal bone. There is a scalp laceration and small scalp hematoma over the right side of the frontal bone. CT MAXILLOFACIAL FINDINGS Osseous: There is a impacted fracture of tip of the nasal bone. Other facial bones are intact. Orbits: Normal. Sinuses: Normal. Soft tissues: Laceration and  small scalp hematoma over the right side of the frontal bone. CT CERVICAL SPINE FINDINGS Alignment: Normal. No fracture or significant subluxation Skull base and vertebrae: . Solid fusion of the C6 and C7 vertebral bodies and lateral masses. Chronic degenerative changes between the anterior arch of C1 and the odontoid process of C2. Soft tissues and spinal canal: No prevertebral fluid or swelling. No visible canal hematoma. Disc levels: No disc protrusions or significant disc bulges. Chronic disc space narrowing at C5-6 with bilateral foraminal stenosis at C5-6, unchanged. Moderately severe left facet arthritis at C3-4 and C4-5, progressed since 2019. Upper chest: Large left pleural effusion. Aortic atherosclerosis. Other: None IMPRESSION: 1. No acute intracranial abnormality. 2. No acute abnormality of the cervical spine. 3. Acute impacted fracture of the  tip of the nasal bone. 4. No other acute abnormality of the maxillofacial bones. 5. Large left pleural effusion. 6. Solid fusion of the C6 and C7 vertebral bodies and lateral masses. 7. Aortic Atherosclerosis (ICD10-I70.0). Electronically Signed   By: Lorriane Shire M.D.   On: 03/12/2019 18:22   Ct Cervical Spine Wo Contrast  Result Date: 03/12/2019 CLINICAL DATA:  Head and face trauma after falling from a wheelchair. EXAM: CT HEAD WITHOUT CONTRAST CT MAXILLOFACIAL WITHOUT CONTRAST CT CERVICAL SPINE WITHOUT CONTRAST TECHNIQUE: Multidetector CT imaging of the head, cervical spine, and maxillofacial structures were performed using the standard protocol without intravenous contrast. Multiplanar CT image reconstructions of the cervical spine and maxillofacial structures were also generated. COMPARISON:  CT scans dated 11/12/2017 FINDINGS: CT HEAD FINDINGS Brain: There is diffuse cerebral cortical atrophy with secondary ventricular dilatation. There has been marked progression of the periventricular white matter disease since the prior study with multiple interval  lacunar infarcts. Increased white matter disease in the occipital lobes, left greater than right. No acute hemorrhage or discrete acute infarction. No mass lesions. Vascular: No hyperdense vessel or unexpected calcification. Skull: No skull fractures. Other: There is a new impacted fracture of nasal bone. There is a scalp laceration and small scalp hematoma over the right side of the frontal  bone. CT MAXILLOFACIAL FINDINGS Osseous: There is a impacted fracture of tip of the nasal bone. Other facial bones are intact. Orbits: Normal. Sinuses: Normal. Soft tissues: Laceration and small scalp hematoma over the right side of the frontal bone. CT CERVICAL SPINE FINDINGS Alignment: Normal. No fracture or significant subluxation Skull base and vertebrae: . Solid fusion of the C6 and C7 vertebral bodies and lateral masses. Chronic degenerative changes between the anterior arch of C1 and the odontoid process of C2. Soft tissues and spinal canal: No prevertebral fluid or swelling. No visible canal hematoma. Disc levels: No disc protrusions or significant disc bulges. Chronic disc space narrowing at C5-6 with bilateral foraminal stenosis at C5-6, unchanged. Moderately severe left facet arthritis at C3-4 and C4-5, progressed since 2019. Upper chest: Large left pleural effusion. Aortic atherosclerosis. Other: None IMPRESSION: 1. No acute intracranial abnormality. 2. No acute abnormality of the cervical spine. 3. Acute impacted fracture of the  tip of the nasal bone. 4. No other acute abnormality of the maxillofacial bones. 5. Large left pleural effusion. 6. Solid fusion of the C6 and C7 vertebral bodies and lateral masses. 7. Aortic Atherosclerosis (ICD10-I70.0). Electronically Signed   By: Lorriane Shire M.D.   On: 03/12/2019 18:22   Dg Pelvis Portable  Result Date: 03/12/2019 CLINICAL DATA:  [place] from wheelchair EXAM: PORTABLE PELVIS 1-2 VIEWS COMPARISON:  CT 09/20/2013 FINDINGS: Bones appear osteopenic. No definitive  fracture or malalignment. Pubic symphysis appears intact. Vascular calcifications IMPRESSION: No definite acute osseous abnormality. CT follow-up if persistent suspicion for pelvic or hip fracture. Electronically Signed   By: Donavan Foil M.D.   On: 03/12/2019 18:30   Dg Chest Portable 1 View  Result Date: 03/12/2019 CLINICAL DATA:  Fall. EXAM: PORTABLE CHEST 1 VIEW COMPARISON:  February 26, 2019. FINDINGS: Stable cardiomegaly. Atherosclerosis of thoracic aorta is noted. No pneumothorax is noted. Stable moderate left pleural effusion is noted with underlying atelectasis or infiltrate. Mild right basilar atelectasis or infiltrate is noted with small pleural effusion. Bony thorax is unremarkable. IMPRESSION: 1. Aortic atherosclerosis. Stable moderate left pleural effusion with underlying atelectasis or infiltrate. 2. Mild right basilar atelectasis or infiltrate is noted with small pleural effusion. Electronically Signed   By: Marijo Conception M.D.   On: 03/12/2019 18:30   Dg Knee Right Port  Result Date: 03/12/2019 CLINICAL DATA:  [place] from wheelchair EXAM: PORTABLE RIGHT KNEE - 1-2 VIEW COMPARISON:  None. FINDINGS: No fracture or malalignment. No significant knee effusion. Vascular calcifications. Advanced arthritis involving the medial and patellofemoral joint spaces with mild degenerative change of the lateral joint space IMPRESSION: Tricompartment arthritis.  No definite acute osseous abnormality Electronically Signed   By: Donavan Foil M.D.   On: 03/12/2019 18:31   Ct Maxillofacial Wo Contrast  Result Date: 03/12/2019 CLINICAL DATA:  Head and face trauma after falling from a wheelchair. EXAM: CT HEAD WITHOUT CONTRAST CT MAXILLOFACIAL WITHOUT CONTRAST CT CERVICAL SPINE WITHOUT CONTRAST TECHNIQUE: Multidetector CT imaging of the head, cervical spine, and maxillofacial structures were performed using the standard protocol without intravenous contrast. Multiplanar CT image reconstructions of the cervical  spine and maxillofacial structures were also generated. COMPARISON:  CT scans dated 11/12/2017 FINDINGS: CT HEAD FINDINGS Brain: There is diffuse cerebral cortical atrophy with secondary ventricular dilatation. There has been marked progression of the periventricular white matter disease since the prior study with multiple interval lacunar infarcts. Increased white matter disease in the occipital lobes, left greater than right. No acute hemorrhage or discrete acute infarction.  No mass lesions. Vascular: No hyperdense vessel or unexpected calcification. Skull: No skull fractures. Other: There is a new impacted fracture of nasal bone. There is a scalp laceration and small scalp hematoma over the right side of the frontal bone. CT MAXILLOFACIAL FINDINGS Osseous: There is a impacted fracture of tip of the nasal bone. Other facial bones are intact. Orbits: Normal. Sinuses: Normal. Soft tissues: Laceration and small scalp hematoma over the right side of the frontal bone. CT CERVICAL SPINE FINDINGS Alignment: Normal. No fracture or significant subluxation Skull base and vertebrae: . Solid fusion of the C6 and C7 vertebral bodies and lateral masses. Chronic degenerative changes between the anterior arch of C1 and the odontoid process of C2. Soft tissues and spinal canal: No prevertebral fluid or swelling. No visible canal hematoma. Disc levels: No disc protrusions or significant disc bulges. Chronic disc space narrowing at C5-6 with bilateral foraminal stenosis at C5-6, unchanged. Moderately severe left facet arthritis at C3-4 and C4-5, progressed since 2019. Upper chest: Large left pleural effusion. Aortic atherosclerosis. Other: None IMPRESSION: 1. No acute intracranial abnormality. 2. No acute abnormality of the cervical spine. 3. Acute impacted fracture of the  tip of the nasal bone. 4. No other acute abnormality of the maxillofacial bones. 5. Large left pleural effusion. 6. Solid fusion of the C6 and C7 vertebral  bodies and lateral masses. 7. Aortic Atherosclerosis (ICD10-I70.0). Electronically Signed   By: Lorriane Shire M.D.   On: 03/12/2019 18:22    Procedures Procedures (including critical care time)  Medications Ordered in ED Medications - No data to display   Initial Impression / Assessment and Plan / ED Course  I have reviewed the triage vital signs and the nursing notes.  Pertinent labs & imaging results that were available during my care of the patient were reviewed by me and considered in my medical decision making (see chart for details).        Patient with fall from wheelchair with laceration to forehead and nose.  She is on Eliquis.  Patient unable to give any history.  She is yelling.  CT head and C-spine are negative.  There is a nasal bone fracture seen.  Her chest x-ray shows a stable left-sided pleural effusion.  She was recently hospitalized and treated for possible pneumonia.  She is not in any respiratory distress and not hypoxic.  Lacerations repaired by [place].  [place] updated.  Patient nonambulatory wheelchair-bound.  Her imaging is negative for acute traumatic injury.  Follow-up for suture removal in 1 week.  Return precautions discussed. She appears stable to return to her facility     ED ECG REPORT   Date: 03/12/2019  Rate: 94  Rhythm: atrial fibrillation  QRS Axis: normal  Intervals: normal  ST/T Wave abnormalities: nonspecific ST/T changes  Conduction Disutrbances:none  Narrative Interpretation:   Old EKG Reviewed: unchanged  I have personally reviewed the EKG tracing and agree with the computerized printout as noted.   Final Clinical Impressions(s) / ED Diagnoses   Final diagnoses:  Fall, initial encounter  Facial laceration, initial encounter    ED Discharge Orders    None       Brianne Maina, Annie Main, MD 03/12/19 2317

## 2019-04-11 DEATH — deceased

## 2020-01-03 IMAGING — CT CT HEAD W/O CM
3 series · 16 of 47 positions shown, 19 images · non-contrast
Comparison: 11/12/2017 and prior CTs

CLINICAL DATA: [AGE] female with altered mental status.
Currently on Eliquis.

EXAM:
CT HEAD WITHOUT CONTRAST
TECHNIQUE: Contiguous axial images were obtained from the base of the skull
through the vertex without intravenous contrast.

[Series 2: head trauma wo · axial · 0.41mm/px · z∈[-99,+31]mm · 10 of 32 slices shown, 13 images]
[im 3/32  brain]
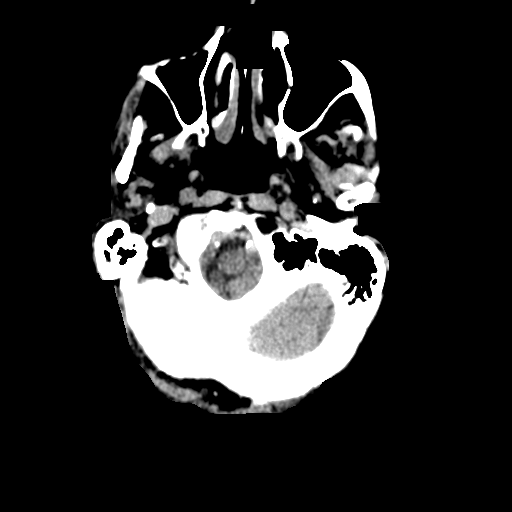
[im 3/32  bone]
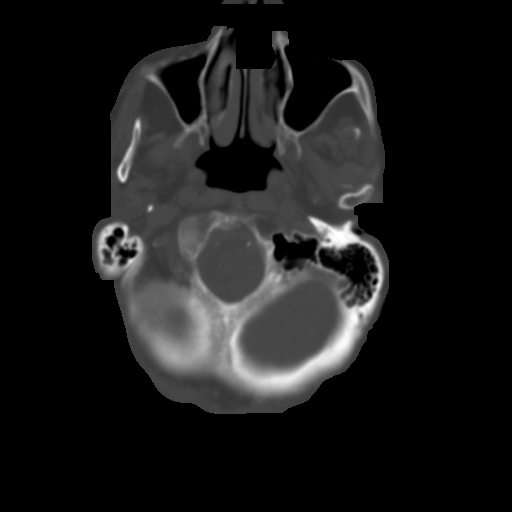
[im 6/32  brain]
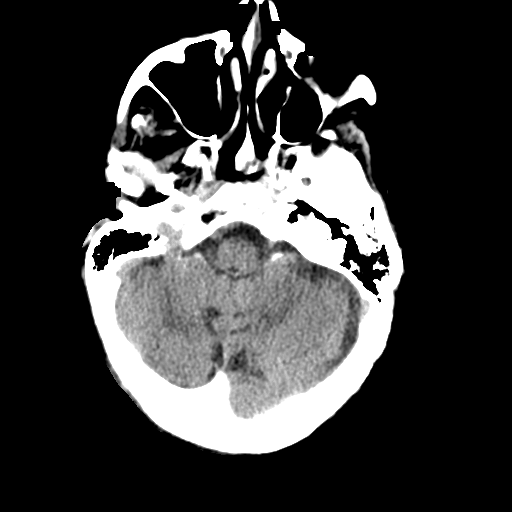
[im 9/32  brain]
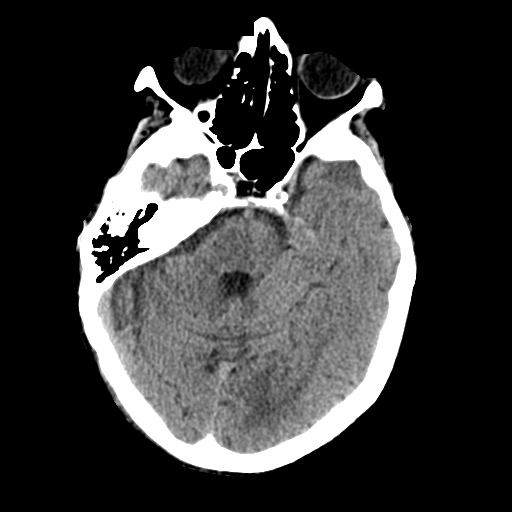
[im 11/32  brain]
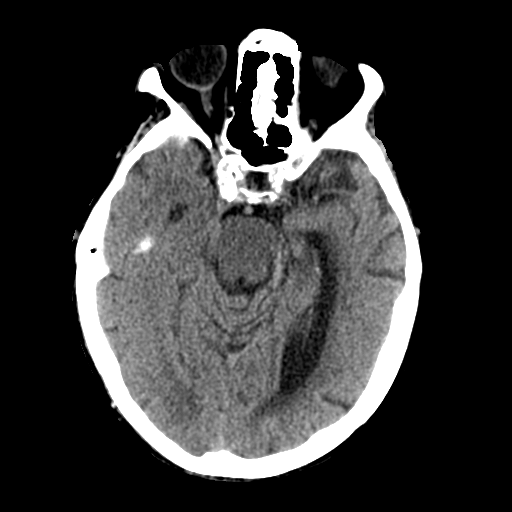
[im 14/32  brain]
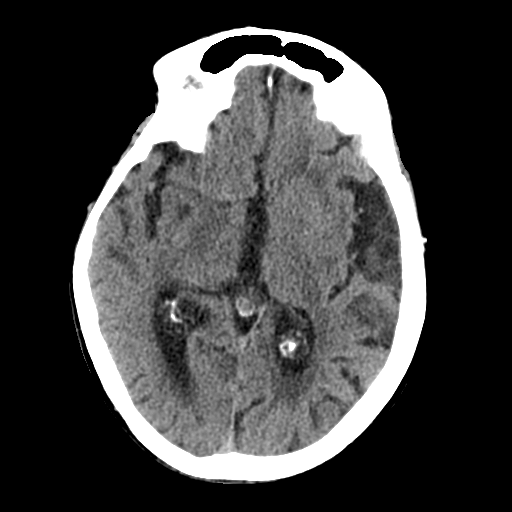
[im 14/32  bone]
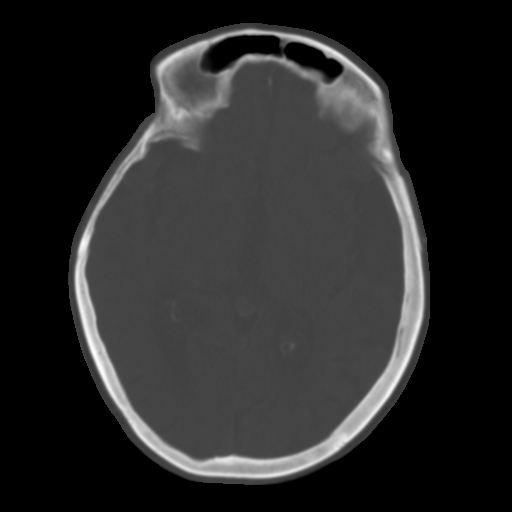
[im 18/32  brain]
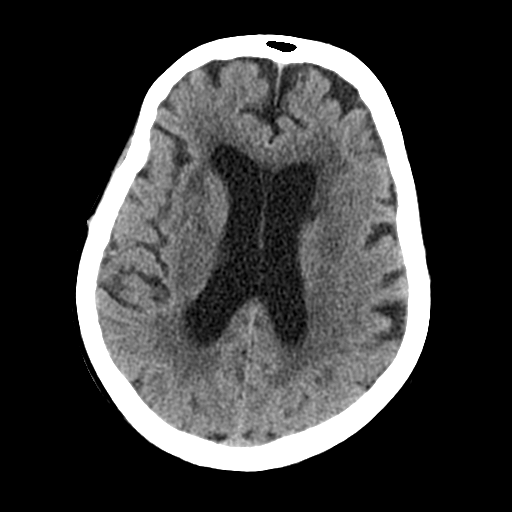
[im 21/32  brain]
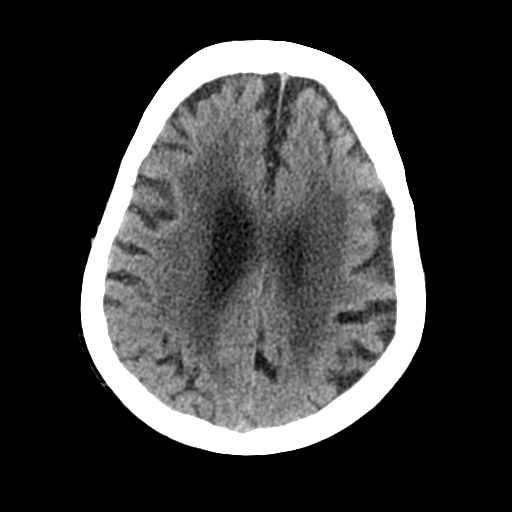
[im 24/32  brain]
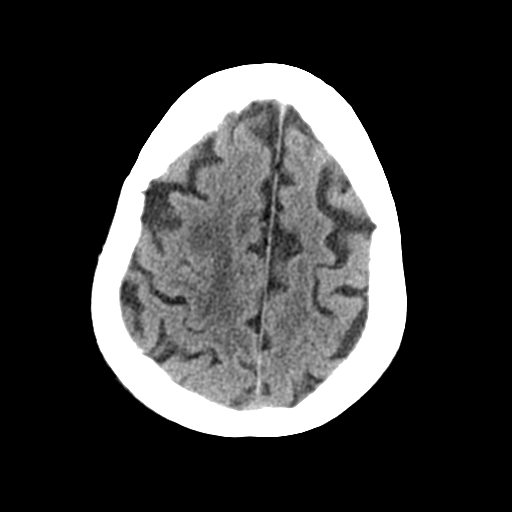
[im 26/32  brain]
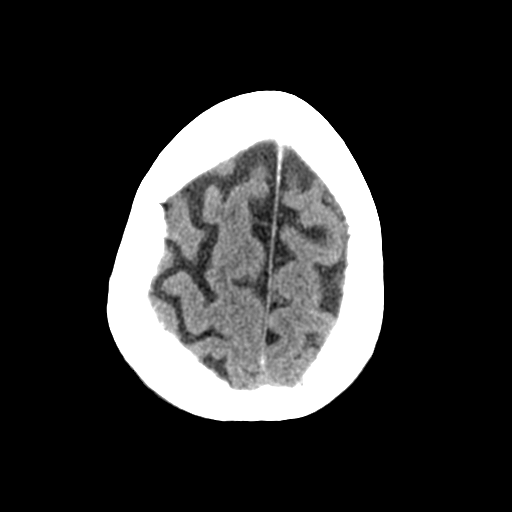
[im 26/32  bone]
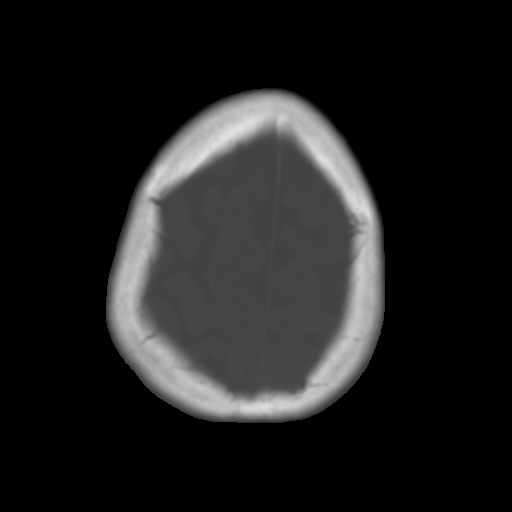
[im 29/32  brain]
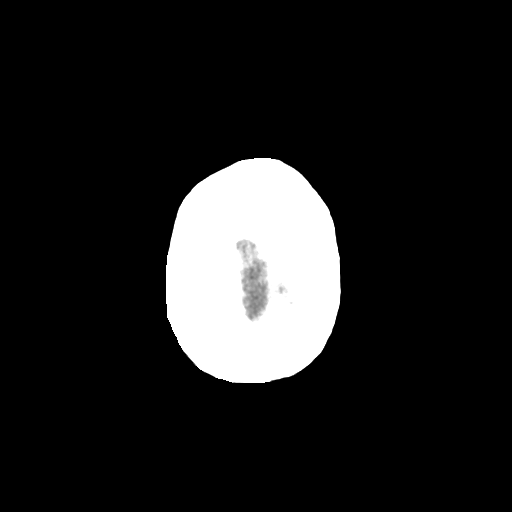

[Series 4: coronal soft tissue · coronal · 0.32mm/px · 3 of 69 slices shown]
[im 23/69  brain]
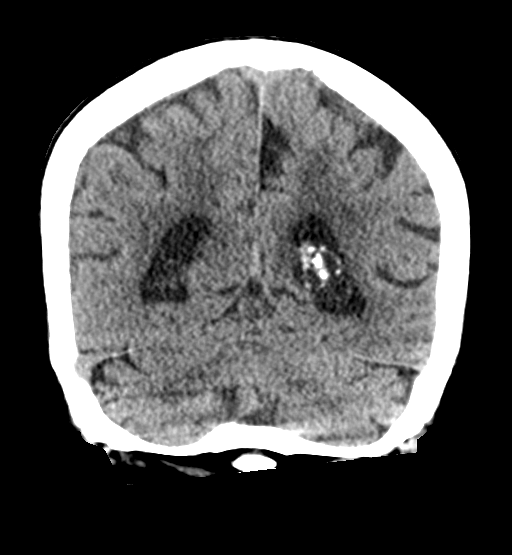
[im 31/69  brain]
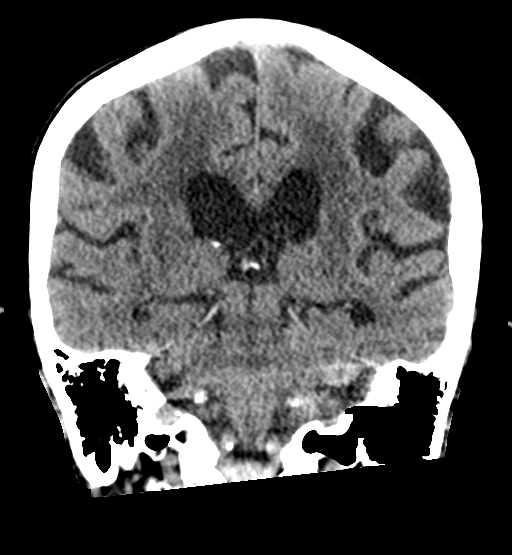
[im 38/69  brain]
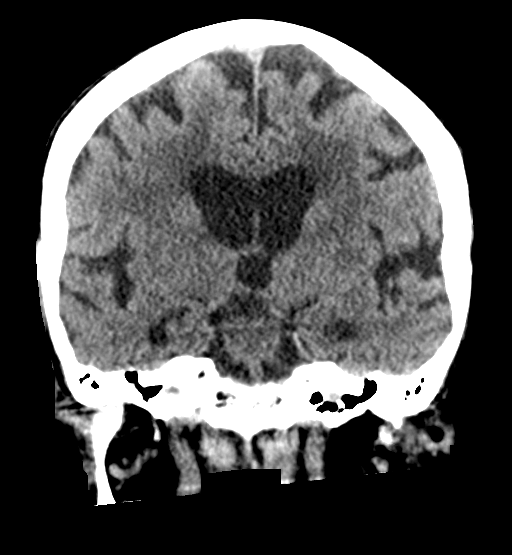

[Series 5: sagittal soft tissue · sagittal · 0.34mm/px · 3 of 54 slices shown]
[im 19/54  brain]
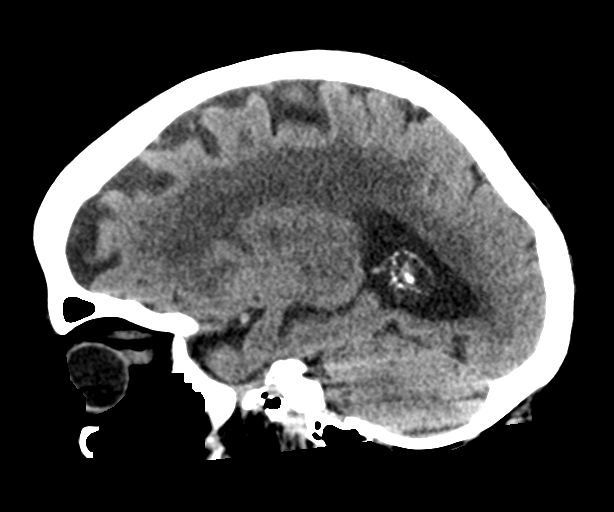
[im 27/54  brain]
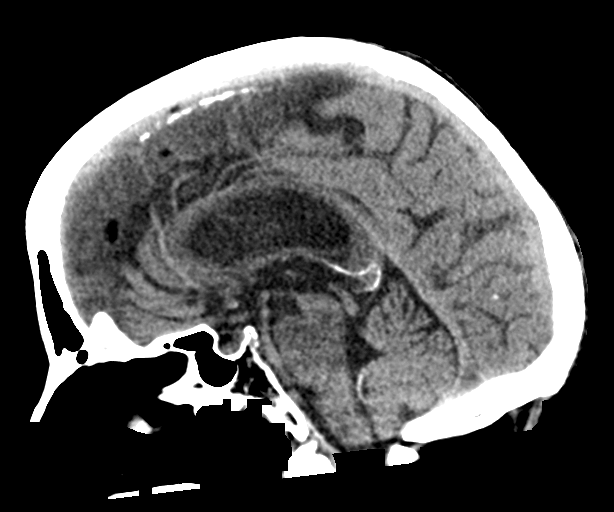
[im 36/54  brain]
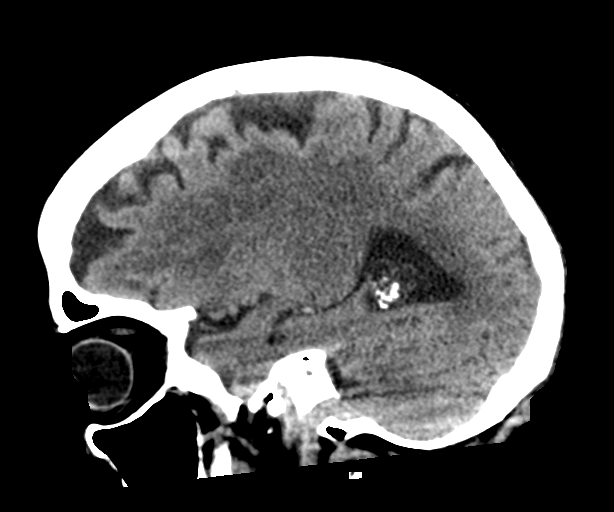

[16 of 47 positions shown; findings below may reference images not displayed]

FINDINGS: Brain: No evidence of acute infarction, hemorrhage, hydrocephalus,
extra-axial collection or mass lesion/mass effect.

Atrophy, chronic small-vessel white matter ischemic changes and
remote LEFT cerebellar infarct again noted.

Vascular: Vertebral and carotid atherosclerotic calcifications again
noted.

Skull: Normal. Negative for fracture or focal lesion.

Sinuses/Orbits: No acute finding.

Other: None.
IMPRESSION: 1. No evidence of acute intracranial abnormality
2. Atrophy, chronic small-vessel white matter ischemic changes and
remote LEFT cerebellar infarct.
